# Patient Record
Sex: Female | Born: 1985
Health system: Southern US, Community
[De-identification: ages and names within clinical notes are randomized; demographics above are authoritative.]

## PROBLEM LIST (undated history)

## (undated) ENCOUNTER — Inpatient Hospital Stay (HOSPITAL_COMMUNITY): Payer: Self-pay

## (undated) DIAGNOSIS — O10919 Unspecified pre-existing hypertension complicating pregnancy, unspecified trimester: Secondary | ICD-10-CM

## (undated) DIAGNOSIS — F32A Depression, unspecified: Secondary | ICD-10-CM

## (undated) DIAGNOSIS — O139 Gestational [pregnancy-induced] hypertension without significant proteinuria, unspecified trimester: Secondary | ICD-10-CM

## (undated) DIAGNOSIS — K219 Gastro-esophageal reflux disease without esophagitis: Secondary | ICD-10-CM

## (undated) DIAGNOSIS — F329 Major depressive disorder, single episode, unspecified: Secondary | ICD-10-CM

## (undated) DIAGNOSIS — R87629 Unspecified abnormal cytological findings in specimens from vagina: Secondary | ICD-10-CM

## (undated) DIAGNOSIS — J45909 Unspecified asthma, uncomplicated: Secondary | ICD-10-CM

## (undated) DIAGNOSIS — F419 Anxiety disorder, unspecified: Secondary | ICD-10-CM

## (undated) DIAGNOSIS — I10 Essential (primary) hypertension: Secondary | ICD-10-CM

## (undated) DIAGNOSIS — B999 Unspecified infectious disease: Secondary | ICD-10-CM

## (undated) HISTORY — PX: LEEP: SHX91

## (undated) HISTORY — DX: Unspecified pre-existing hypertension complicating pregnancy, unspecified trimester: O10.919

## (undated) HISTORY — PX: LYMPH GLAND EXCISION: SHX13

---

## 2011-05-02 DIAGNOSIS — F909 Attention-deficit hyperactivity disorder, unspecified type: Secondary | ICD-10-CM | POA: Insufficient documentation

## 2011-05-02 DIAGNOSIS — F411 Generalized anxiety disorder: Secondary | ICD-10-CM | POA: Insufficient documentation

## 2011-05-02 DIAGNOSIS — I10 Essential (primary) hypertension: Secondary | ICD-10-CM | POA: Insufficient documentation

## 2013-02-02 DIAGNOSIS — K219 Gastro-esophageal reflux disease without esophagitis: Secondary | ICD-10-CM | POA: Insufficient documentation

## 2013-02-02 DIAGNOSIS — F53 Postpartum depression: Secondary | ICD-10-CM | POA: Insufficient documentation

## 2013-03-22 ENCOUNTER — Emergency Department: Payer: Self-pay | Admitting: Emergency Medicine

## 2013-03-22 LAB — COMPREHENSIVE METABOLIC PANEL
Anion Gap: 6 — ABNORMAL LOW (ref 7–16)
BUN: 11 mg/dL (ref 7–18)
Chloride: 105 mmol/L (ref 98–107)
Creatinine: 0.83 mg/dL (ref 0.60–1.30)
EGFR (African American): >60
Glucose: 99 mg/dL (ref 65–99)
Osmolality: 270 (ref 275–301)
SGPT (ALT): 21 U/L (ref 12–78)

## 2013-03-22 LAB — CBC
HCT: 45.1 % (ref 35.0–47.0)
HGB: 15.4 g/dL (ref 12.0–16.0)
MCH: 29.9 pg (ref 26.0–34.0)
MCHC: 34.2 g/dL (ref 32.0–36.0)
MCV: 88 fL (ref 80–100)
RDW: 12.8 % (ref 11.5–14.5)
WBC: 6.2 10*3/uL (ref 3.6–11.0)

## 2013-05-13 NOTE — L&D Delivery Note (Signed)
Patient was C/C/+4 and pushed for 1 minutes with epidural.   NSVD  female infant, Apgars 9,9, weight P.   The patient had no lacerations. Fundus was firm. EBL was expected amount. Placenta was delivered intact. Vagina was clear.  Baby was vigorous and doing skin to skin with mother.  Shauntell Iglesia A

## 2013-10-06 ENCOUNTER — Emergency Department (HOSPITAL_COMMUNITY)
Admission: EM | Admit: 2013-10-06 | Discharge: 2013-10-06 | Disposition: A | Discharge status: Home / Self Care | Payer: 59 | Attending: Emergency Medicine | Admitting: Emergency Medicine

## 2013-10-06 ENCOUNTER — Encounter (HOSPITAL_COMMUNITY): Payer: Self-pay | Admitting: Emergency Medicine

## 2013-10-06 DIAGNOSIS — R5381 Other malaise: Secondary | ICD-10-CM | POA: Insufficient documentation

## 2013-10-06 DIAGNOSIS — I10 Essential (primary) hypertension: Secondary | ICD-10-CM

## 2013-10-06 DIAGNOSIS — Z3490 Encounter for supervision of normal pregnancy, unspecified, unspecified trimester: Secondary | ICD-10-CM

## 2013-10-06 DIAGNOSIS — O9989 Other specified diseases and conditions complicating pregnancy, childbirth and the puerperium: Secondary | ICD-10-CM | POA: Insufficient documentation

## 2013-10-06 DIAGNOSIS — O99330 Smoking (tobacco) complicating pregnancy, unspecified trimester: Secondary | ICD-10-CM | POA: Insufficient documentation

## 2013-10-06 DIAGNOSIS — R509 Fever, unspecified: Secondary | ICD-10-CM | POA: Insufficient documentation

## 2013-10-06 DIAGNOSIS — O169 Unspecified maternal hypertension, unspecified trimester: Secondary | ICD-10-CM | POA: Insufficient documentation

## 2013-10-06 DIAGNOSIS — R5383 Other fatigue: Secondary | ICD-10-CM

## 2013-10-06 DIAGNOSIS — Z79899 Other long term (current) drug therapy: Secondary | ICD-10-CM | POA: Insufficient documentation

## 2013-10-06 HISTORY — DX: Essential (primary) hypertension: I10

## 2013-10-06 LAB — URINALYSIS, ROUTINE W REFLEX MICROSCOPIC
BILIRUBIN URINE: NEGATIVE
Glucose, UA: NEGATIVE mg/dL
Hgb urine dipstick: NEGATIVE
Ketones, ur: NEGATIVE mg/dL
LEUKOCYTES UA: NEGATIVE
Nitrite: NEGATIVE
PH: 6 (ref 5.0–8.0)
Protein, ur: NEGATIVE mg/dL
SPECIFIC GRAVITY, URINE: 1.026 (ref 1.005–1.030)
Urobilinogen, UA: 1 mg/dL (ref 0.0–1.0)

## 2013-10-06 LAB — BASIC METABOLIC PANEL
BUN: 11 mg/dL (ref 6–23)
CHLORIDE: 96 meq/L (ref 96–112)
CO2: 25 meq/L (ref 19–32)
Calcium: 9.8 mg/dL (ref 8.4–10.5)
Creatinine, Ser: 0.65 mg/dL (ref 0.50–1.10)
GFR calc Af Amer: >90 mL/min (ref >90)
GFR calc non Af Amer: >90 mL/min (ref >90)
Glucose, Bld: 91 mg/dL (ref 70–99)
Potassium: 3.6 mEq/L — ABNORMAL LOW (ref 3.7–5.3)
Sodium: 135 mEq/L — ABNORMAL LOW (ref 137–147)

## 2013-10-06 LAB — POC URINE PREG, ED: Preg Test, Ur: POSITIVE — AB

## 2013-10-06 MED ORDER — ACETAMINOPHEN 325 MG PO TABS
650.0000 mg | ORAL_TABLET | Freq: Once | ORAL | Status: AC
  Administered 2013-10-06: 650 mg via ORAL
  Filled 2013-10-06: qty 2

## 2013-10-06 NOTE — ED Provider Notes (Signed)
CSN: [id number]     Arrival date & time 10/06/13  1910 History   First MD Initiated Contact with Patient 10/06/13 2115     Chief Complaint  Patient presents with  . Hypertension     (Consider location/radiation/quality/duration/timing/severity/associated sxs/prior Treatment) HPI  This is a 28 yo G2P1 approx [redacted] weeks pregnant who presents with concerns for HTN.  Patient reports history of HTN as well as complications during her last pregnancy related to high blood pressure.  She is taking HCTZ and amlodipine.  Reports that felt weak with HA earlier and took her BP and noted to be 160/90.  COncerned given pregnancy.  Currently without complaint and triage BP 138/86.  Has not seen OB yet.  Denies abdominal pain, vaginal discharge, or bleeding.  Past Medical History  Diagnosis Date  . Hypertension    Past Surgical History  Procedure Laterality Date  . Lymph gland excision     History reviewed. No pertinent family history. History  Substance Use Topics  . Smoking status: Current Some Day Smoker  . Smokeless tobacco: Not on file  . Alcohol Use: No   OB History   Grav Para Term Preterm Abortions TAB SAB Ect Mult Living                 Review of Systems  Constitutional: Positive for fever.  Respiratory: Negative for shortness of breath.   Cardiovascular: Negative for chest pain.  Gastrointestinal: Negative for abdominal pain.  Genitourinary: Negative for dysuria, vaginal bleeding and vaginal discharge.  Skin: Negative for wound.  Neurological: Positive for headaches. Negative for numbness.  Psychiatric/Behavioral: Negative for confusion.  All other systems reviewed and are negative.     Allergies  Sulfate  Home Medications   Prior to Admission medications   Medication Sig Start Date End Date Taking? Authorizing Provider  albuterol (PROVENTIL HFA;VENTOLIN HFA) 108 (90 BASE) MCG/ACT inhaler Inhale 2 puffs into the lungs every 6 (six) hours as needed for wheezing or  shortness of breath.   Yes Historical Provider, MD  amLODipine (NORVASC) 5 MG tablet Take 5 mg by mouth daily.   Yes Historical Provider, MD  diphenhydrAMINE (BENADRYL) 25 mg capsule Take 25 mg by mouth every 8 (eight) hours as needed for itching.   Yes Historical Provider, MD  hydrochlorothiazide (HYDRODIURIL) 25 MG tablet Take 25 mg by mouth daily.   Yes Historical Provider, MD  Prenatal Vit-Fe Fumarate-FA (MULTIVITAMIN-PRENATAL) 27-0.8 MG TABS tablet Take 1 tablet by mouth daily at 12 noon.   Yes Historical Provider, MD   BP 116/69  Pulse 83  Temp(Src) 98.6 F (37 C) (Oral)  Resp 16  Ht 5\' 4"  (1.626 m)  Wt 187 lb (84.823 kg)  BMI 32.08 kg/m2  SpO2 100% Physical Exam  Nursing note and vitals reviewed. Constitutional: She is oriented to person, place, and time. She appears well-developed and well-nourished. No distress.  HENT:  Head: Normocephalic and atraumatic.  Mouth/Throat: Oropharynx is clear and moist.  Eyes: EOM are normal. Pupils are equal, round, and reactive to light.  Cardiovascular: Normal rate, regular rhythm and normal heart sounds.   No murmur heard. Pulmonary/Chest: Effort normal and breath sounds normal. No respiratory distress. She has no wheezes.  Abdominal: Soft. Bowel sounds are normal. There is no tenderness. There is no rebound.  Musculoskeletal: She exhibits no edema.  Neurological: She is alert and oriented to person, place, and time.  5/5 strength in all four extremities, CN II-XII intact, visual fields intact  Skin: Skin  is warm and dry.  Psychiatric: She has a normal mood and affect.    ED Course  Procedures (including critical care time) Labs Review Labs Reviewed  BASIC METABOLIC PANEL - Abnormal; Notable for the following:    Sodium 135 (*)    Potassium 3.6 (*)    All other components within normal limits  URINALYSIS, ROUTINE W REFLEX MICROSCOPIC - Abnormal; Notable for the following:    APPearance CLOUDY (*)    All other components within  normal limits  POC URINE PREG, ED - Abnormal; Notable for the following:    Preg Test, Ur POSITIVE (*)    All other components within normal limits    Imaging Review No results found.   EKG Interpretation None      MDM   Final diagnoses:  Pregnancy  Hypertension    Patient with 1st trimester pregnancy and HTN.  Currently assymptomatic.  Triage BP 138/86. Nonfocal on exam. Screening BMP and UA reassuring and no evidence of proteinuria.  Repeat BP 116/69.  Patient reassured.  Patient encouraged to continue BP meds and f/u with OB closely.  No current pregnancy related complaints.  After history, exam, and medical workup I feel the patient has been appropriately medically screened and is safe for discharge home. Pertinent diagnoses were discussed with the patient. Patient was given return precautions.     Shon Batonourtney F Horton, MD 10/08/13 2107

## 2013-10-06 NOTE — Discharge Instructions (Signed)
Arterial Hypertension °Arterial hypertension (high blood pressure) is a condition of elevated pressure in your blood vessels. Hypertension over a long period of time is a risk factor for strokes, heart attacks, and heart failure. It is also the leading cause of kidney (renal) failure.  °CAUSES  °· In Adults -- Over 90% of all hypertension has no known cause. This is called essential or primary hypertension. In the other 10% of people with hypertension, the increase in blood pressure is caused by another disorder. This is called secondary hypertension. Important causes of secondary hypertension are: °· Heavy alcohol use. °· Obstructive sleep apnea. °· Hyperaldosterosim (Conn's syndrome). °· Steroid use. °· Chronic kidney failure. °· Hyperparathyroidism. °· Medications. °· Renal artery stenosis. °· Pheochromocytoma. °· Cushing's disease. °· Coarctation of the aorta. °· Scleroderma renal crisis. °· Licorice (in excessive amounts). °· Drugs (cocaine, methamphetamine). °Your caregiver can explain any items above that apply to you. °· In Children -- Secondary hypertension is more common and should always be considered. °· Pregnancy -- Few women of childbearing age have high blood pressure. However, up to 10% of them develop hypertension of pregnancy. Generally, this will not harm the woman. It may be a sign of 3 complications of pregnancy: preeclampsia, HELLP syndrome, and eclampsia. Follow up and control with medication is necessary. °SYMPTOMS  °· This condition normally does not produce any noticeable symptoms. It is usually found during a routine exam. °· Malignant hypertension is a late problem of high blood pressure. It may have the following symptoms: °· Headaches. °· Blurred vision. °· End-organ damage (this means your kidneys, heart, lungs, and other organs are being damaged). °· Stressful situations can increase the blood pressure. If a person with normal blood pressure has their blood pressure go up while being  seen by their caregiver, this is often termed "white coat hypertension." Its importance is not known. It may be related with eventually developing hypertension or complications of hypertension. °· Hypertension is often confused with mental tension, stress, and anxiety. °DIAGNOSIS  °The diagnosis is made by 3 separate blood pressure measurements. They are taken at least 1 week apart from each other. If there is organ damage from hypertension, the diagnosis may be made without repeat measurements. °Hypertension is usually identified by having blood pressure readings: °· Above 140/90 mmHg measured in both arms, at 3 separate times, over a couple weeks. °· Over 130/80 mmHg should be considered a risk factor and may require treatment in patients with diabetes. °Blood pressure readings over 120/80 mmHg are called "pre-hypertension" even in non-diabetic patients. °To get a true blood pressure measurement, use the following guidelines. Be aware of the factors that can alter blood pressure readings. °· Take measurements at least 1 hour after caffeine. °· Take measurements 30 minutes after smoking and without any stress. This is another reason to quit smoking  it raises your blood pressure. °· Use a proper cuff size. Ask your caregiver if you are not sure about your cuff size. °· Most home blood pressure cuffs are automatic. They will measure systolic and diastolic pressures. The systolic pressure is the pressure reading at the start of sounds. Diastolic pressure is the pressure at which the sounds disappear. If you are elderly, measure pressures in multiple postures. Try sitting, lying or standing. °· Sit at rest for a minimum of 5 minutes before taking measurements. °· You should not be on any medications like decongestants. These are found in many cold medications. °· Record your blood pressure readings and review   them with your caregiver. °If you have hypertension: °· Your caregiver may do tests to be sure you do not have  secondary hypertension (see "causes" above). °· Your caregiver may also look for signs of metabolic syndrome. This is also called Syndrome X or Insulin Resistance Syndrome. You may have this syndrome if you have type 2 diabetes, abdominal obesity, and abnormal blood lipids in addition to hypertension. °· Your caregiver will take your medical and family history and perform a physical exam. °· Diagnostic tests may include blood tests (for glucose, cholesterol, potassium, and kidney function), a urinalysis, or an EKG. Other tests may also be necessary depending on your condition. °PREVENTION  °There are important lifestyle issues that you can adopt to reduce your chance of developing hypertension: °· Maintain a normal weight. °· Limit the amount of salt (sodium) in your diet. °· Exercise often. °· Limit alcohol intake. °· Get enough potassium in your diet. Discuss specific advice with your caregiver. °· Follow a DASH diet (dietary approaches to stop hypertension). This diet is rich in fruits, vegetables, and low-fat dairy products, and avoids certain fats. °PROGNOSIS  °Essential hypertension cannot be cured. Lifestyle changes and medical treatment can lower blood pressure and reduce complications. The prognosis of secondary hypertension depends on the underlying cause. Many people whose hypertension is controlled with medicine or lifestyle changes can live a normal, healthy life.  °RISKS AND COMPLICATIONS  °While high blood pressure alone is not an illness, it often requires treatment due to its short- and long-term effects on many organs. Hypertension increases your risk for: °· CVAs or strokes (cerebrovascular accident). °· Heart failure due to chronically high blood pressure (hypertensive cardiomyopathy). °· Heart attack (myocardial infarction). °· Damage to the retina (hypertensive retinopathy). °· Kidney failure (hypertensive nephropathy). °Your caregiver can explain list items above that apply to you. Treatment  of hypertension can significantly reduce the risk of complications. °TREATMENT  °· For overweight patients, weight loss and regular exercise are recommended. Physical fitness lowers blood pressure. °· Mild hypertension is usually treated with diet and exercise. A diet rich in fruits and vegetables, fat-free dairy products, and foods low in fat and salt (sodium) can help lower blood pressure. Decreasing salt intake decreases blood pressure in a 1/3 of people. °· Stop smoking if you are a smoker. °The steps above are highly effective in reducing blood pressure. While these actions are easy to suggest, they are difficult to achieve. Most patients with moderate or severe hypertension end up requiring medications to bring their blood pressure down to a normal level. There are several classes of medications for treatment. Blood pressure pills (antihypertensives) will lower blood pressure by their different actions. Lowering the blood pressure by 10 mmHg may decrease the risk of complications by as much as 25%. °The goal of treatment is effective blood pressure control. This will reduce your risk for complications. Your caregiver will help you determine the best treatment for you according to your lifestyle. What is excellent treatment for one person, may not be for you. °HOME CARE INSTRUCTIONS  °· Do not smoke. °· Follow the lifestyle changes outlined in the "Prevention" section. °· If you are on medications, follow the directions carefully. Blood pressure medications must be taken as prescribed. Skipping doses reduces their benefit. It also puts you at risk for problems. °· Follow up with your caregiver, as directed. °· If you are asked to monitor your blood pressure at home, follow the guidelines in the "Diagnosis" section above. °SEEK MEDICAL CARE   IF:   You think you are having medication side effects.  You have recurrent headaches or lightheadedness.  You have swelling in your ankles.  You have trouble with  your vision. SEEK IMMEDIATE MEDICAL CARE IF:   You have sudden onset of chest pain or pressure, difficulty breathing, or other symptoms of a heart attack.  You have a severe headache.  You have symptoms of a stroke (such as sudden weakness, difficulty speaking, difficulty walking). MAKE SURE YOU:   Understand these instructions.  Will watch your condition.  Will get help right away if you are not doing well or get worse. Document Released: 04/29/2005 Document Revised: 07/22/2011 Document Reviewed: 11/27/2006 [facility] Patient Information 2014 [place], [place]. Pregnancy  During your pregnancy, it is important to follow certain guidelines in order to have a healthy baby. It is very important to get good prenatal care and follow your caregiver's instructions. Prenatal care includes all the medical care you receive before your baby's birth. This helps to prevent problems during the pregnancy and childbirth. HOME CARE INSTRUCTIONS   Start your prenatal visits by the 12th week of pregnancy or earlier, if possible. At first, appointments are usually scheduled monthly. They become more frequent in the last 2 months before delivery. It is important that you keep your caregiver's appointments and follow your caregiver's instructions regarding medication use, exercise, and diet.  During pregnancy, you are providing food for you and your baby. Eat a regular, well-balanced diet. Choose foods such as meat, fish, milk and other dairy products, vegetables, fruits, whole-grain breads and cereals. Your caregiver will inform you of the ideal weight gain depending on your current height and weight. Drink lots of liquids. Try to drink 8 glasses of water a day.  Alcohol is associated with a number of birth defects including fetal alcohol syndrome. It is best to avoid alcohol completely. Smoking will cause low birth rate and prematurity. Use of alcohol and nicotine during your pregnancy also increases the chances  that your child will be chemically dependent later in their life and may contribute to SIDS (Sudden Infant Death Syndrome).  Do not use illegal drugs.  Only take prescription or over-the-counter medications that are recommended by your caregiver. Other medications can cause genetic and physical problems in the baby.  Morning sickness can often be helped by keeping soda crackers at the bedside. Eat a few before getting up in the morning.  A sexual relationship may be continued until near the end of pregnancy if there are no other problems such as early (premature) leaking of amniotic fluid from the membranes, vaginal bleeding, painful intercourse or belly (abdominal) pain.  Exercise regularly. Check with your caregiver if you are unsure of the safety of some of your exercises.  Do not use hot tubs, steam rooms or saunas. These increase the risk of fainting and hurting yourself and the baby. Swimming is OK for exercise. Get plenty of rest, including afternoon naps when possible, especially in the third trimester.  Avoid toxic odors and chemicals.  Do not wear high heels. They may cause you to lose your balance and fall.  Do not lift over 5 pounds. If you do lift anything, lift with your legs and thighs, not your back.  Avoid long trips, especially in the third trimester.  If you have to travel out of the city or state, take a copy of your medical records with you. SEEK IMMEDIATE MEDICAL CARE IF:   You develop an unexplained oral temperature above 102  F (38.9 C), or as your caregiver suggests.  You have leaking of fluid from the vagina. If leaking membranes are suspected, take your temperature and inform your caregiver of this when you call.  There is vaginal spotting or bleeding. Notify your caregiver of the amount and how many pads are used.  You continue to feel sick to your stomach (nauseous) and have no relief from remedies suggested, or you throw up (vomit) blood or coffee ground  like materials.  You develop upper abdominal pain.  You have round ligament discomfort in the lower abdominal area. This still must be evaluated by your caregiver.  You feel contractions of the uterus.  You do not feel the baby move, or there is less movement than before.  You have painful urination.  You have abnormal vaginal discharge.  You have persistent diarrhea.  You get a severe headache.  You have problems with your vision.  You develop muscle weakness.  You feel dizzy and faint.  You develop shortness of breath.  You develop chest pain.  You have back pain that travels down to your leg and feet.  You feel irregular or a very fast heartbeat.  You develop excessive weight gain in a short period of time (5 pounds in 3 to 5 days).  You are involved in a domestic violence situation. Document Released: 04/29/2005 Document Revised: 10/29/2011 Document Reviewed: 10/21/2008 [facility] Patient Information 2014 [place], [place].

## 2013-10-06 NOTE — ED Notes (Signed)
Pt states that she took her BP this afternoon and that it was elevated at 160/90 and having a headache, feeling weak and experienced some vision changes. Pt reports a history of blood pressure since 14. Pt currently on BP meds. BP normal on arrival. Pt alert and ambulatory. No initial prenatal visit yet but denies any problems since pregnancy started.

## 2013-11-22 ENCOUNTER — Encounter (HOSPITAL_COMMUNITY): Payer: Self-pay | Admitting: *Deleted

## 2013-11-22 ENCOUNTER — Inpatient Hospital Stay (HOSPITAL_COMMUNITY)
Admission: AD | Admit: 2013-11-22 | Discharge: 2013-11-22 | Disposition: A | Discharge status: Home / Self Care | Payer: 59 | Source: Ambulatory Visit | Attending: Obstetrics and Gynecology | Admitting: Obstetrics and Gynecology

## 2013-11-22 DIAGNOSIS — O99340 Other mental disorders complicating pregnancy, unspecified trimester: Secondary | ICD-10-CM | POA: Insufficient documentation

## 2013-11-22 DIAGNOSIS — O10912 Unspecified pre-existing hypertension complicating pregnancy, second trimester: Secondary | ICD-10-CM

## 2013-11-22 DIAGNOSIS — O10019 Pre-existing essential hypertension complicating pregnancy, unspecified trimester: Secondary | ICD-10-CM

## 2013-11-22 DIAGNOSIS — F341 Dysthymic disorder: Secondary | ICD-10-CM | POA: Insufficient documentation

## 2013-11-22 DIAGNOSIS — Z87891 Personal history of nicotine dependence: Secondary | ICD-10-CM | POA: Insufficient documentation

## 2013-11-22 HISTORY — DX: Gestational (pregnancy-induced) hypertension without significant proteinuria, unspecified trimester: O13.9

## 2013-11-22 HISTORY — DX: Unspecified infectious disease: B99.9

## 2013-11-22 HISTORY — DX: Unspecified abnormal cytological findings in specimens from vagina: R87.629

## 2013-11-22 HISTORY — DX: Depression, unspecified: F32.A

## 2013-11-22 HISTORY — DX: Anxiety disorder, unspecified: F41.9

## 2013-11-22 HISTORY — DX: Major depressive disorder, single episode, unspecified: F32.9

## 2013-11-22 LAB — CBC
HEMATOCRIT: 39.5 % (ref 36.0–46.0)
HEMOGLOBIN: 13.7 g/dL (ref 12.0–15.0)
MCH: 30 pg (ref 26.0–34.0)
MCHC: 34.7 g/dL (ref 30.0–36.0)
MCV: 86.4 fL (ref 78.0–100.0)
Platelets: 270 10*3/uL (ref 150–400)
RBC: 4.57 MIL/uL (ref 3.87–5.11)
RDW: 13.2 % (ref 11.5–15.5)
WBC: 11.2 10*3/uL — ABNORMAL HIGH (ref 4.0–10.5)

## 2013-11-22 LAB — PROTEIN / CREATININE RATIO, URINE
CREATININE, URINE: 127.9 mg/dL
Protein Creatinine Ratio: 0.05 (ref 0.00–0.15)
TOTAL PROTEIN, URINE: 7 mg/dL

## 2013-11-22 LAB — COMPREHENSIVE METABOLIC PANEL
ALK PHOS: 55 U/L (ref 39–117)
ALT: 20 U/L (ref 0–35)
ANION GAP: 12 (ref 5–15)
AST: 18 U/L (ref 0–37)
Albumin: 3.3 g/dL — ABNORMAL LOW (ref 3.5–5.2)
BUN: 5 mg/dL — AB (ref 6–23)
CALCIUM: 9.8 mg/dL (ref 8.4–10.5)
CO2: 23 mEq/L (ref 19–32)
Chloride: 101 mEq/L (ref 96–112)
Creatinine, Ser: 0.57 mg/dL (ref 0.50–1.10)
GFR calc non Af Amer: >90 mL/min (ref >90)
GLUCOSE: 74 mg/dL (ref 70–99)
Potassium: 4.3 mEq/L (ref 3.7–5.3)
Sodium: 136 mEq/L — ABNORMAL LOW (ref 137–147)
TOTAL PROTEIN: 6.8 g/dL (ref 6.0–8.3)
Total Bilirubin: <0.2 mg/dL — ABNORMAL LOW (ref 0.3–1.2)

## 2013-11-22 LAB — URINALYSIS, ROUTINE W REFLEX MICROSCOPIC
Bilirubin Urine: NEGATIVE
GLUCOSE, UA: NEGATIVE mg/dL
Hgb urine dipstick: NEGATIVE
Ketones, ur: NEGATIVE mg/dL
LEUKOCYTES UA: NEGATIVE
NITRITE: NEGATIVE
PROTEIN: NEGATIVE mg/dL
Specific Gravity, Urine: 1.02 (ref 1.005–1.030)
Urobilinogen, UA: 0.2 mg/dL (ref 0.0–1.0)
pH: 6 (ref 5.0–8.0)

## 2013-11-22 MED ORDER — LABETALOL HCL 200 MG PO TABS: 200.0000 mg | ORAL_TABLET | Freq: Two times a day (BID) | ORAL | Status: DC

## 2013-11-22 MED ORDER — LABETALOL HCL 100 MG PO TABS
200.0000 mg | ORAL_TABLET | ORAL | Status: AC
  Administered 2013-11-22: 200 mg via ORAL
  Filled 2013-11-22: qty 2

## 2013-11-22 MED ORDER — LABETALOL HCL 200 MG PO TABS: 200.0000 mg | ORAL_TABLET | ORAL | Status: DC

## 2013-11-22 NOTE — Discharge Instructions (Signed)

## 2013-11-22 NOTE — [facility] Note (Addendum)
Hx of chronic hypertension, is on medication.  BP high all weekend, feeling dizzy at times, called MD instructed to come in and get checked. 156/102 this weekend. 150/98 this morning.

## 2013-11-22 NOTE — [facility] Provider Note (Signed)
Discussed with Dr. Mora ApplPinn prior to pt arrival in [facility].  Alerted that patient was just seen in office and Rx given as above.  Pt not yet started.  Agree with note above.

## 2013-11-22 NOTE — MAU Provider Note (Signed)
Chief Complaint: Hypertension   None    SUBJECTIVE HPI: Kelli Wells is a 28 y.o. G2P1001 at [redacted]w[redacted]d by LMP who presents to maternity admissions reporting elevated blood pressures x3 days with intermittent h/a unresolved by Tylenol. Her blood pressures were 150s/90s at home yesterday and today.  She reports she has chronic HTN and was taking Aldomet but her doctor recently changed her to labetalol.  She has not yet started the labetalol and is worried about cost. She may have to wait until this Friday which is payday so she can pick up her new Rx.  She denies vaginal bleeding, vaginal itching/burning, urinary symptoms, dizziness, n/v, or fever/chills.    Past Medical History  Diagnosis Date  . Pregnancy induced hypertension   . Vaginal Pap smear, abnormal     LEEP, normal since  . Infection     UTI  . Anxiety   . Depression    Past Surgical History  Procedure Laterality Date  . Leep     History   Social History  . Marital Status: Unknown    Spouse Name: N/A    Number of Children: N/A  . Years of Education: N/A   Occupational History  . Not on file.   Social History Main Topics  . Smoking status: Former Games developermoker  . Smokeless tobacco: Never Used     Comment: quit June 2015  . Alcohol Use: No  . Drug Use: No  . Sexual Activity: Not on file   Other Topics Concern  . Not on file   Social History Narrative  . No narrative on file   No current facility-administered medications on file prior to encounter.   No current outpatient prescriptions on file prior to encounter.   Allergies  Allergen Reactions  . Sulfa Antibiotics Swelling    ROS: Pertinent items in HPI  OBJECTIVE Blood pressure 138/80, pulse 78, temperature 98.3 F (36.8 C), temperature source Oral, resp. rate 18, height 5' 2.25" (1.581 m), weight 88.451 kg (195 lb).  Patient Vitals for the past 24 hrs:  BP Temp Temp src Pulse Resp Height Weight  11/22/13 1349 138/80 mmHg - - 78 - - -  11/22/13 1105  145/94 mmHg - - 95 - - -  11/22/13 1058 146/90 mmHg 98.3 F (36.8 C) Oral 100 18 5' 2.25" (1.581 m) 88.451 kg (195 lb)   GENERAL: Well-developed, well-nourished female in no acute distress.  HEENT: Normocephalic HEART: normal rate RESP: normal effort ABDOMEN: Soft, non-tender EXTREMITIES: Nontender, no edema NEURO: Alert and oriented  FHT 146 by doppler  LAB RESULTS Results for orders placed during the hospital encounter of 11/22/13 (from the past 24 hour(s))  URINALYSIS, ROUTINE W REFLEX MICROSCOPIC     Status: None   Collection Time    11/22/13 10:38 AM      Result Value Ref Range   Color, Urine YELLOW  YELLOW   APPearance CLEAR  CLEAR   Specific Gravity, Urine 1.020  1.005 - 1.030   pH 6.0  5.0 - 8.0   Glucose, UA NEGATIVE  NEGATIVE mg/dL   Hgb urine dipstick NEGATIVE  NEGATIVE   Bilirubin Urine NEGATIVE  NEGATIVE   Ketones, ur NEGATIVE  NEGATIVE mg/dL   Protein, ur NEGATIVE  NEGATIVE mg/dL   Urobilinogen, UA 0.2  0.0 - 1.0 mg/dL   Nitrite NEGATIVE  NEGATIVE   Leukocytes, UA NEGATIVE  NEGATIVE  PROTEIN / CREATININE RATIO, URINE     Status: None   Collection Time  11/22/13 10:38 AM      Result Value Ref Range   Creatinine, Urine 127.90     Total Protein, Urine 7     PROTEIN CREATININE RATIO 0.05  0.00 - 0.15  CBC     Status: Abnormal   Collection Time    11/22/13 12:15 PM      Result Value Ref Range   WBC 11.2 (*) 4.0 - 10.5 K/uL   RBC 4.57  3.87 - 5.11 MIL/uL   Hemoglobin 13.7  12.0 - 15.0 g/dL   HCT 09.8  11.9 - 14.7 %   MCV 86.4  78.0 - 100.0 fL   MCH 30.0  26.0 - 34.0 pg   MCHC 34.7  30.0 - 36.0 g/dL   RDW 82.9  56.2 - 13.0 %   Platelets 270  150 - 400 K/uL  COMPREHENSIVE METABOLIC PANEL     Status: Abnormal   Collection Time    11/22/13 12:15 PM      Result Value Ref Range   Sodium 136 (*) 137 - 147 mEq/L   Potassium 4.3  3.7 - 5.3 mEq/L   Chloride 101  96 - 112 mEq/L   CO2 23  19 - 32 mEq/L   Glucose, Bld 74  70 - 99 mg/dL   BUN 5 (*) 6 - 23  mg/dL   Creatinine, Ser 8.65  0.50 - 1.10 mg/dL   Calcium 9.8  8.4 - 78.4 mg/dL   Total Protein 6.8  6.0 - 8.3 g/dL   Albumin 3.3 (*) 3.5 - 5.2 g/dL   AST 18  0 - 37 U/L   ALT 20  0 - 35 U/L   Alkaline Phosphatase 55  39 - 117 U/L   Total Bilirubin <0.2 (*) 0.3 - 1.2 mg/dL   GFR calc non Af Amer >90  >90 mL/min   GFR calc Af Amer >90  >90 mL/min   Anion gap 12  5 - 15    ASSESSMENT 1. Chronic hypertension in pregnancy, second trimester     PLAN Consult Dr Claiborne Billings Labetalol 200 mg PO x1 dose in MAU Discharge home Pick up labetalol Rx as soon as possible.  Discontinue Aldomet and start labetalol 200 mg BID.  Pt given 24 hour urine supplies and instructions.  Drop off urine in office this week.   F/U in office as scheduled Return to MAU as needed for emergencies    Medication List    STOP taking these medications       methyldopa 250 MG tablet  Commonly known as:  ALDOMET      TAKE these medications       acetaminophen 500 MG tablet  Commonly known as:  TYLENOL  Take 1,000 mg by mouth every 6 (six) hours as needed for fever.     labetalol 200 MG tablet  Commonly known as:  NORMODYNE  Take 1 tablet (200 mg total) by mouth stat.     prenatal multivitamin Tabs tablet  Take 1 tablet by mouth daily at 12 noon.       Follow-up Information   Follow up with CALLAHAN, SIDNEY, DO In 1 day. (Drop off 24 hour urine tomorrow.  Start labetalol prescription as soon as possible.  Keep scheduled appointments in the office. )    Specialty:  Obstetrics and Gynecology   Contact information:   [address] [place] [address] [phone]       Sharen Counter Certified Nurse-Midwife 11/22/2013  1:54 PM

## 2014-02-17 ENCOUNTER — Encounter (HOSPITAL_COMMUNITY): Payer: Self-pay

## 2014-02-17 ENCOUNTER — Inpatient Hospital Stay (HOSPITAL_COMMUNITY)
Admission: AD | Admit: 2014-02-17 | Discharge: 2014-02-17 | Disposition: A | Discharge status: Home / Self Care | Payer: 59 | Source: Ambulatory Visit | Attending: Obstetrics and Gynecology | Admitting: Obstetrics and Gynecology

## 2014-02-17 DIAGNOSIS — O162 Unspecified maternal hypertension, second trimester: Secondary | ICD-10-CM

## 2014-02-17 DIAGNOSIS — O99332 Smoking (tobacco) complicating pregnancy, second trimester: Secondary | ICD-10-CM | POA: Diagnosis not present

## 2014-02-17 DIAGNOSIS — Z3A25 25 weeks gestation of pregnancy: Secondary | ICD-10-CM | POA: Diagnosis not present

## 2014-02-17 DIAGNOSIS — O10012 Pre-existing essential hypertension complicating pregnancy, second trimester: Secondary | ICD-10-CM | POA: Insufficient documentation

## 2014-02-17 LAB — CBC
HCT: 37.9 % (ref 36.0–46.0)
Hemoglobin: 13.1 g/dL (ref 12.0–15.0)
MCH: 29.3 pg (ref 26.0–34.0)
MCHC: 34.6 g/dL (ref 30.0–36.0)
MCV: 84.8 fL (ref 78.0–100.0)
Platelets: 237 10*3/uL (ref 150–400)
RBC: 4.47 MIL/uL (ref 3.87–5.11)
RDW: 13.6 % (ref 11.5–15.5)
WBC: 12.8 10*3/uL — AB (ref 4.0–10.5)

## 2014-02-17 LAB — COMPREHENSIVE METABOLIC PANEL
ALBUMIN: 3.3 g/dL — AB (ref 3.5–5.2)
ALT: 17 U/L (ref 0–35)
AST: 13 U/L (ref 0–37)
Alkaline Phosphatase: 65 U/L (ref 39–117)
Anion gap: 11 (ref 5–15)
BUN: 7 mg/dL (ref 6–23)
CO2: 20 mEq/L (ref 19–32)
Calcium: 9.2 mg/dL (ref 8.4–10.5)
Chloride: 104 mEq/L (ref 96–112)
Creatinine, Ser: 0.55 mg/dL (ref 0.50–1.10)
GFR calc Af Amer: >90 mL/min (ref >90)
GFR calc non Af Amer: >90 mL/min (ref >90)
Glucose, Bld: 75 mg/dL (ref 70–99)
POTASSIUM: 4 meq/L (ref 3.7–5.3)
SODIUM: 135 meq/L — AB (ref 137–147)
Total Bilirubin: <0.2 mg/dL — ABNORMAL LOW (ref 0.3–1.2)
Total Protein: 6.5 g/dL (ref 6.0–8.3)

## 2014-02-17 LAB — PROTEIN / CREATININE RATIO, URINE
Creatinine, Urine: 178.08 mg/dL
PROTEIN CREATININE RATIO: 0.06 (ref 0.00–0.15)
Total Protein, Urine: 10.3 mg/dL

## 2014-02-17 LAB — URINALYSIS, ROUTINE W REFLEX MICROSCOPIC
Bilirubin Urine: NEGATIVE
GLUCOSE, UA: NEGATIVE mg/dL
Ketones, ur: NEGATIVE mg/dL
LEUKOCYTES UA: NEGATIVE
Nitrite: NEGATIVE
PROTEIN: NEGATIVE mg/dL
Specific Gravity, Urine: 1.025 (ref 1.005–1.030)
UROBILINOGEN UA: 0.2 mg/dL (ref 0.0–1.0)
pH: 6 (ref 5.0–8.0)

## 2014-02-17 LAB — URINE MICROSCOPIC-ADD ON

## 2014-02-17 LAB — URIC ACID: URIC ACID, SERUM: 3.9 mg/dL (ref 2.4–7.0)

## 2014-02-17 MED ORDER — ACETAMINOPHEN 500 MG PO TABS
1000.0000 mg | ORAL_TABLET | Freq: Once | ORAL | Status: AC
  Administered 2014-02-17: 1000 mg via ORAL
  Filled 2014-02-17: qty 2

## 2014-02-17 NOTE — MAU Note (Signed)
Patient states she has a history of HTN. States she has been under a lot of stress at work and took her BP and was 159/104 and this am was 135/82.  150/94 just prior to coming to MAU. States she has been having headaches off and on for a couple of weeks, blurred vision today. Mild tightening in the abdomen, good fetal movement. Denies bleeding but has leaking that she thinks is her bladder.

## 2014-02-17 NOTE — MAU Provider Note (Signed)
History     CSN: [id number]  Arrival date and time: 02/17/14 1244   None     Chief Complaint  Patient presents with  . Hypertension   HPIpt is G2P1 at 5937w6d with hx of pre-eclampsia with previous pregnancy 9 years ago delivered at 37 weeks.  Pt has been on  labetolol with control until recently.  Pt has BP of BP 159/104 and then this morning was 135/82 at work this morning and 159/94 before came to MAU.  Pt has been having headaches frontal- with blurred vision. Pt has been taking Tylenol - last taken 3 days ago with relief. Pt has not taken any today. Pt wears panty liner for leakage of urine.   RN note: Garvin Filaonna C Coley, RN Registered Nurse Signed Nursing MAU Note Service date: 02/17/2014 1:02 PM   Patient states she has a history of HTN. States she has been under a lot of stress at work and took her BP and was 159/104 and this am was 135/82. 150/94 just prior to coming to MAU. States she has been having headaches off and on for a couple of weeks, blurred vision today. Mild tightening in the abdomen, good fetal movement. Denies bleeding but has leaking that she thinks is her bladder.      Past Medical History  Diagnosis Date  . Hypertension     Past Surgical History  Procedure Laterality Date  . Lymph gland excision      No family history on file.  History  Substance Use Topics  . Smoking status: Current Some Day Smoker  . Smokeless tobacco: Not on file  . Alcohol Use: No    Allergies:  Allergies  Allergen Reactions  . Sulfate Swelling    Prescriptions prior to admission  Medication Sig Dispense Refill  . albuterol (PROVENTIL HFA;VENTOLIN HFA) 108 (90 BASE) MCG/ACT inhaler Inhale 2 puffs into the lungs every 6 (six) hours as needed for wheezing or shortness of breath.      Marland Kitchen. amLODipine (NORVASC) 5 MG tablet Take 5 mg by mouth daily.      . diphenhydrAMINE (BENADRYL) 25 mg capsule Take 25 mg by mouth every 8 (eight) hours as needed for itching.      .  hydrochlorothiazide (HYDRODIURIL) 25 MG tablet Take 25 mg by mouth daily.      . Prenatal Vit-Fe Fumarate-FA (MULTIVITAMIN-PRENATAL) 27-0.8 MG TABS tablet Take 1 tablet by mouth daily at 12 noon.        Review of Systems  Constitutional: Negative for fever.  Gastrointestinal: Positive for abdominal pain. Negative for nausea, vomiting, diarrhea and constipation.  Genitourinary: Negative for dysuria and urgency.  Neurological: Positive for headaches.   Physical Exam   Blood pressure 151/76, pulse 97, temperature 98.9 F (37.2 C), temperature source Oral, resp. rate 16, height 5\' 3"  (1.6 m), weight 197 lb 12.8 oz (89.721 kg), SpO2 99.00%.  Physical Exam  Vitals reviewed. Constitutional: She is oriented to person, place, and time. She appears well-developed and well-nourished. No distress.  HENT:  Head: Normocephalic.  Eyes: Pupils are equal, round, and reactive to light.  Neck: Normal range of motion. Neck supple.  Cardiovascular: Normal rate.   Respiratory: Effort normal.  GI: Soft. She exhibits no distension. There is no tenderness. There is no rebound.  No ctx noted  Musculoskeletal: Normal range of motion.  Neurological: She is alert and oriented to person, place, and time.  Skin: Skin is warm and dry.  Psychiatric: She has a normal mood  and affect.    MAU Course  Procedures Results for orders placed during the hospital encounter of 02/17/14 (from the past 24 hour(s))  URINALYSIS, ROUTINE W REFLEX MICROSCOPIC     Status: Abnormal   Collection Time    02/17/14 12:50 PM      Result Value Ref Range   Color, Urine YELLOW  YELLOW   APPearance CLEAR  CLEAR   Specific Gravity, Urine 1.025  1.005 - 1.030   pH 6.0  5.0 - 8.0   Glucose, UA NEGATIVE  NEGATIVE mg/dL   Hgb urine dipstick TRACE (*) NEGATIVE   Bilirubin Urine NEGATIVE  NEGATIVE   Ketones, ur NEGATIVE  NEGATIVE mg/dL   Protein, ur NEGATIVE  NEGATIVE mg/dL   Urobilinogen, UA 0.2  0.0 - 1.0 mg/dL   Nitrite NEGATIVE   NEGATIVE   Leukocytes, UA NEGATIVE  NEGATIVE  URINE MICROSCOPIC-ADD ON     Status: Abnormal   Collection Time    02/17/14 12:50 PM      Result Value Ref Range   Squamous Epithelial / LPF FEW (*) RARE   WBC, UA 0-2  <3 WBC/hpf   RBC / HPF 3-6  <3 RBC/hpf   Bacteria, UA FEW (*) RARE  CBC     Status: Abnormal   Collection Time    02/17/14  1:40 PM      Result Value Ref Range   WBC 12.8 (*) 4.0 - 10.5 K/uL   RBC 4.47  3.87 - 5.11 MIL/uL   Hemoglobin 13.1  12.0 - 15.0 g/dL   HCT 16.1  09.6 - 04.5 %   MCV 84.8  78.0 - 100.0 fL   MCH 29.3  26.0 - 34.0 pg   MCHC 34.6  30.0 - 36.0 g/dL   RDW 40.9  81.1 - 91.4 %   Platelets 237  150 - 400 K/uL  COMPREHENSIVE METABOLIC PANEL     Status: Abnormal   Collection Time    02/17/14  1:40 PM      Result Value Ref Range   Sodium 135 (*) 137 - 147 mEq/L   Potassium 4.0  3.7 - 5.3 mEq/L   Chloride 104  96 - 112 mEq/L   CO2 20  19 - 32 mEq/L   Glucose, Bld 75  70 - 99 mg/dL   BUN 7  6 - 23 mg/dL   Creatinine, Ser 7.82  0.50 - 1.10 mg/dL   Calcium 9.2  8.4 - 95.6 mg/dL   Total Protein 6.5  6.0 - 8.3 g/dL   Albumin 3.3 (*) 3.5 - 5.2 g/dL   AST 13  0 - 37 U/L   ALT 17  0 - 35 U/L   Alkaline Phosphatase 65  39 - 117 U/L   Total Bilirubin <0.2 (*) 0.3 - 1.2 mg/dL   GFR calc non Af Amer >90  >90 mL/min   GFR calc Af Amer >90  >90 mL/min   Anion gap 11  5 - 15  URIC ACID     Status: None   Collection Time    02/17/14  1:40 PM      Result Value Ref Range   Uric Acid, Serum 3.9  2.4 - 7.0 mg/dL  BP range 213-086/57-84 Tylenol 1000mg  given for h/a with some diminishment of pain Pt fed which helped headache as well No ctx noted FHR baseline 150 bpm with 10x10 accelerations LM for Dr. Henderson Cloud to call- Dr. Henderson Cloud to call back Will go ahead and send pt home  to f/u in office tomorrow for BP check Discussed with Dr. Henderson Cloud- pt may f/u in office next week unless BP rises to 100 diastolic again or sx Attempted to call pt- wrong mobile# in chart; LM  on home phone to CB(non personalized) Assessment and Plan  Hypertension in pregnancy- second trimester F/u in office tomorrow for repeat BP- may wait until next week unless elevated again   [facility] 02/17/2014, 1:23 PM

## 2014-02-27 ENCOUNTER — Encounter (HOSPITAL_COMMUNITY): Payer: Self-pay | Admitting: Advanced Practice Midwife

## 2014-02-27 ENCOUNTER — Inpatient Hospital Stay (HOSPITAL_COMMUNITY)
Admission: AD | Admit: 2014-02-27 | Discharge: 2014-02-27 | Disposition: A | Discharge status: Home / Self Care | Payer: 59 | Source: Ambulatory Visit | Attending: Obstetrics and Gynecology | Admitting: Obstetrics and Gynecology

## 2014-02-27 DIAGNOSIS — N760 Acute vaginitis: Secondary | ICD-10-CM | POA: Insufficient documentation

## 2014-02-27 DIAGNOSIS — O23592 Infection of other part of genital tract in pregnancy, second trimester: Secondary | ICD-10-CM | POA: Insufficient documentation

## 2014-02-27 DIAGNOSIS — Z87891 Personal history of nicotine dependence: Secondary | ICD-10-CM | POA: Insufficient documentation

## 2014-02-27 DIAGNOSIS — B9689 Other specified bacterial agents as the cause of diseases classified elsewhere: Secondary | ICD-10-CM | POA: Insufficient documentation

## 2014-02-27 DIAGNOSIS — O26852 Spotting complicating pregnancy, second trimester: Secondary | ICD-10-CM | POA: Diagnosis present

## 2014-02-27 DIAGNOSIS — Z3A27 27 weeks gestation of pregnancy: Secondary | ICD-10-CM | POA: Diagnosis not present

## 2014-02-27 LAB — URINALYSIS, ROUTINE W REFLEX MICROSCOPIC
BILIRUBIN URINE: NEGATIVE
Glucose, UA: NEGATIVE mg/dL
Hgb urine dipstick: NEGATIVE
KETONES UR: NEGATIVE mg/dL
LEUKOCYTES UA: NEGATIVE
NITRITE: NEGATIVE
PROTEIN: NEGATIVE mg/dL
Specific Gravity, Urine: >1.03 — ABNORMAL HIGH (ref 1.005–1.030)
UROBILINOGEN UA: 1 mg/dL (ref 0.0–1.0)
pH: 6 (ref 5.0–8.0)

## 2014-02-27 LAB — WET PREP, GENITAL
Trich, Wet Prep: NONE SEEN
Yeast Wet Prep HPF POC: NONE SEEN

## 2014-02-27 MED ORDER — METRONIDAZOLE 500 MG PO TABS: 500.0000 mg | ORAL_TABLET | Freq: Two times a day (BID) | ORAL | Status: DC

## 2014-02-27 NOTE — MAU Note (Signed)
Patient noticed light pink spotting on Friday. Patient noticed brownish spotting on Saturday after intercourse and this morning.

## 2014-02-27 NOTE — MAU Provider Note (Signed)
History     CSN: [id number]  Arrival date and time: 02/27/14 1210   None     Chief Complaint  Patient presents with  . Vaginal Bleeding   HPI 28 y.o. G2P1001 at 2844w2d with spotting x 2 days. Pt states she had intercourse on Thursday, pink spotting Friday, brown spotting on Saturday following intercourse, brown spotting today. No pain. Prenatal course complicated by PIH, on labetalol, otherwise normal. Pt states she has had normal ultrasounds and that her blood type is O pos.    Past Medical History  Diagnosis Date  . Pregnancy induced hypertension   . Vaginal Pap smear, abnormal     LEEP, normal since  . Infection     UTI  . Anxiety   . Depression     Past Surgical History  Procedure Laterality Date  . Leep      Family History  Problem Relation Age of Onset  . Hypertension Mother   . Cancer Mother     rectal  . Hypertension Father   . Hypertension Maternal Grandmother   . Cancer Maternal Grandmother     breast  . Hypertension Maternal Grandfather   . Kidney disease Paternal Grandmother   . Heart disease Paternal Grandfather   . Hearing loss Neg Hx     History  Substance Use Topics  . Smoking status: Former Games developermoker  . Smokeless tobacco: Never Used     Comment: quit June 2015  . Alcohol Use: No    Allergies:  Allergies  Allergen Reactions  . Sulfa Antibiotics Swelling    Prescriptions prior to admission  Medication Sig Dispense Refill  . acetaminophen (TYLENOL) 500 MG tablet Take 1,000 mg by mouth every 6 (six) hours as needed for fever.      . labetalol (NORMODYNE) 200 MG tablet Take 1 tablet (200 mg total) by mouth 2 (two) times daily.  60 tablet  0  . Prenatal Vit-Fe Fumarate-FA (PRENATAL MULTIVITAMIN) TABS tablet Take 1 tablet by mouth daily at 12 noon.        ROS Physical Exam   Blood pressure 139/82, pulse 102, temperature 98.2 F (36.8 C), temperature source Oral, resp. rate 16, height 5\' 3"  (1.6 m), weight 197 lb (89.359 kg).  Physical  Exam  Nursing note and vitals reviewed. Constitutional: She is oriented to person, place, and time. She appears well-developed and well-nourished. No distress.  Cardiovascular: Normal rate.   Respiratory: Effort normal.  GI: Soft. There is no tenderness.  Genitourinary: There is no rash, tenderness or lesion on the right labia. There is no rash, tenderness or lesion on the left labia. Cervix exhibits discharge (scant brown mucous). Vaginal discharge (creamy white) found.  Cervix Long/thick/closed  Musculoskeletal: Normal range of motion.  Neurological: She is alert and oriented to person, place, and time.  Skin: Skin is warm and dry.  Psychiatric: She has a normal mood and affect.   EFM: 160, mod variability, no decels, reassuring at 27 weeks, TOCO quiet  MAU Course  Procedures  Results for orders placed during the hospital encounter of 02/27/14 (from the past 24 hour(s))  URINALYSIS, ROUTINE W REFLEX MICROSCOPIC     Status: Abnormal   Collection Time    02/27/14 12:19 PM      Result Value Ref Range   Color, Urine YELLOW  YELLOW   APPearance CLEAR  CLEAR   Specific Gravity, Urine >1.030 (*) 1.005 - 1.030   pH 6.0  5.0 - 8.0   Glucose, UA NEGATIVE  NEGATIVE mg/dL   Hgb urine dipstick NEGATIVE  NEGATIVE   Bilirubin Urine NEGATIVE  NEGATIVE   Ketones, ur NEGATIVE  NEGATIVE mg/dL   Protein, ur NEGATIVE  NEGATIVE mg/dL   Urobilinogen, UA 1.0  0.0 - 1.0 mg/dL   Nitrite NEGATIVE  NEGATIVE   Leukocytes, UA NEGATIVE  NEGATIVE  WET PREP, GENITAL     Status: Abnormal   Collection Time    02/27/14  1:02 PM      Result Value Ref Range   Yeast Wet Prep HPF POC NONE SEEN  NONE SEEN   Trich, Wet Prep NONE SEEN  NONE SEEN   Clue Cells Wet Prep HPF POC FEW (*) NONE SEEN   WBC, Wet Prep HPF POC FEW (*) NONE SEEN     Assessment and Plan   1. Spotting affecting pregnancy in second trimester, antepartum   2. Bacterial vaginosis       Medication List         acetaminophen 500 MG  tablet  Commonly known as:  TYLENOL  Take 1,000 mg by mouth every 6 (six) hours as needed for mild pain or headache.     labetalol 200 MG tablet  Commonly known as:  NORMODYNE  Take 400 mg by mouth 2 (two) times daily.     metroNIDAZOLE 500 MG tablet  Commonly known as:  FLAGYL  Take 1 tablet (500 mg total) by mouth 2 (two) times daily.     prenatal multivitamin Tabs tablet  Take 1 tablet by mouth daily at 12 noon.            Follow-up Information   Follow up with Philip AspenALLAHAN, SIDNEY, DO. (as scheduled or sooner as needed)    Specialty:  Obstetrics and Gynecology   Contact information:   [address] [place] [id number] [phone]         [facility]RAZ[facility],Vinnie Gombert 02/27/2014, 12:37 PM

## 2014-02-28 LAB — GC/CHLAMYDIA PROBE AMP
CT Probe RNA: NEGATIVE
GC PROBE AMP APTIMA: NEGATIVE

## 2014-03-01 ENCOUNTER — Encounter (HOSPITAL_COMMUNITY): Payer: Self-pay

## 2014-03-14 ENCOUNTER — Encounter (HOSPITAL_COMMUNITY): Payer: Self-pay

## 2014-04-05 ENCOUNTER — Observation Stay (HOSPITAL_COMMUNITY)
Admission: AD | Admit: 2014-04-05 | Discharge: 2014-04-06 | Disposition: A | Discharge status: Home / Self Care | Payer: 59 | Source: Ambulatory Visit | Attending: Obstetrics and Gynecology | Admitting: Obstetrics and Gynecology

## 2014-04-05 ENCOUNTER — Encounter (HOSPITAL_COMMUNITY): Payer: Self-pay | Admitting: *Deleted

## 2014-04-05 DIAGNOSIS — O10913 Unspecified pre-existing hypertension complicating pregnancy, third trimester: Secondary | ICD-10-CM | POA: Diagnosis not present

## 2014-04-05 DIAGNOSIS — O99343 Other mental disorders complicating pregnancy, third trimester: Secondary | ICD-10-CM | POA: Insufficient documentation

## 2014-04-05 DIAGNOSIS — Z87891 Personal history of nicotine dependence: Secondary | ICD-10-CM | POA: Insufficient documentation

## 2014-04-05 DIAGNOSIS — O288 Other abnormal findings on antenatal screening of mother: Secondary | ICD-10-CM | POA: Insufficient documentation

## 2014-04-05 DIAGNOSIS — F329 Major depressive disorder, single episode, unspecified: Secondary | ICD-10-CM | POA: Insufficient documentation

## 2014-04-05 DIAGNOSIS — Z3A32 32 weeks gestation of pregnancy: Secondary | ICD-10-CM | POA: Diagnosis not present

## 2014-04-05 DIAGNOSIS — Z87440 Personal history of urinary (tract) infections: Secondary | ICD-10-CM | POA: Diagnosis not present

## 2014-04-05 DIAGNOSIS — F419 Anxiety disorder, unspecified: Secondary | ICD-10-CM | POA: Diagnosis not present

## 2014-04-05 DIAGNOSIS — O99513 Diseases of the respiratory system complicating pregnancy, third trimester: Secondary | ICD-10-CM | POA: Insufficient documentation

## 2014-04-05 DIAGNOSIS — O99413 Diseases of the circulatory system complicating pregnancy, third trimester: Secondary | ICD-10-CM | POA: Diagnosis present

## 2014-04-05 DIAGNOSIS — J45909 Unspecified asthma, uncomplicated: Secondary | ICD-10-CM | POA: Insufficient documentation

## 2014-04-05 DIAGNOSIS — O10919 Unspecified pre-existing hypertension complicating pregnancy, unspecified trimester: Secondary | ICD-10-CM | POA: Diagnosis present

## 2014-04-05 HISTORY — DX: Unspecified asthma, uncomplicated: J45.909

## 2014-04-05 LAB — COMPREHENSIVE METABOLIC PANEL
ALBUMIN: 3.1 g/dL — AB (ref 3.5–5.2)
ALK PHOS: 73 U/L (ref 39–117)
ALT: 16 U/L (ref 0–35)
ANION GAP: 11 (ref 5–15)
AST: 17 U/L (ref 0–37)
BUN: 5 mg/dL — AB (ref 6–23)
CO2: 21 mEq/L (ref 19–32)
Calcium: 9 mg/dL (ref 8.4–10.5)
Chloride: 106 mEq/L (ref 96–112)
Creatinine, Ser: 0.57 mg/dL (ref 0.50–1.10)
GFR calc Af Amer: >90 mL/min (ref >90)
GFR calc non Af Amer: >90 mL/min (ref >90)
Glucose, Bld: 79 mg/dL (ref 70–99)
Potassium: 4 mEq/L (ref 3.7–5.3)
Sodium: 138 mEq/L (ref 137–147)
TOTAL PROTEIN: 6.8 g/dL (ref 6.0–8.3)
Total Bilirubin: <0.2 mg/dL — ABNORMAL LOW (ref 0.3–1.2)

## 2014-04-05 LAB — URINALYSIS, ROUTINE W REFLEX MICROSCOPIC
BILIRUBIN URINE: NEGATIVE
Glucose, UA: NEGATIVE mg/dL
HGB URINE DIPSTICK: NEGATIVE
Ketones, ur: 15 mg/dL — AB
Leukocytes, UA: NEGATIVE
Nitrite: NEGATIVE
PROTEIN: NEGATIVE mg/dL
Specific Gravity, Urine: >1.03 — ABNORMAL HIGH (ref 1.005–1.030)
Urobilinogen, UA: 0.2 mg/dL (ref 0.0–1.0)
pH: 6 (ref 5.0–8.0)

## 2014-04-05 LAB — CBC
HEMATOCRIT: 39.1 % (ref 36.0–46.0)
Hemoglobin: 13.4 g/dL (ref 12.0–15.0)
MCH: 29 pg (ref 26.0–34.0)
MCHC: 34.3 g/dL (ref 30.0–36.0)
MCV: 84.6 fL (ref 78.0–100.0)
Platelets: 236 10*3/uL (ref 150–400)
RBC: 4.62 MIL/uL (ref 3.87–5.11)
RDW: 14.2 % (ref 11.5–15.5)
WBC: 11.9 10*3/uL — ABNORMAL HIGH (ref 4.0–10.5)

## 2014-04-05 LAB — LACTATE DEHYDROGENASE: LDH: 149 U/L (ref 94–250)

## 2014-04-05 LAB — PROTEIN / CREATININE RATIO, URINE
CREATININE, URINE: 260.75 mg/dL
Protein Creatinine Ratio: 0.07 (ref 0.00–0.15)
TOTAL PROTEIN, URINE: 17.7 mg/dL

## 2014-04-05 LAB — URIC ACID: Uric Acid, Serum: 4.5 mg/dL (ref 2.4–7.0)

## 2014-04-05 LAB — TYPE AND SCREEN
ABO/RH(D): O POS
Antibody Screen: NEGATIVE

## 2014-04-05 MED ORDER — LORATADINE 10 MG PO TABS: 10.0000 mg | ORAL_TABLET | Freq: Every day | ORAL | Status: DC | PRN

## 2014-04-05 MED ORDER — LABETALOL HCL 300 MG PO TABS
400.0000 mg | ORAL_TABLET | Freq: Two times a day (BID) | ORAL | Status: DC
  Administered 2014-04-05 – 2014-04-06 (×2): 400 mg via ORAL
  Filled 2014-04-05 (×4): qty 1

## 2014-04-05 MED ORDER — PRENATAL MULTIVITAMIN CH
1.0000 | ORAL_TABLET | Freq: Every day | ORAL | Status: DC
  Administered 2014-04-06: 1 via ORAL
  Filled 2014-04-05 (×3): qty 1

## 2014-04-05 MED ORDER — ACETAMINOPHEN 325 MG PO TABS: 650.0000 mg | ORAL_TABLET | ORAL | Status: DC | PRN

## 2014-04-05 MED ORDER — ZOLPIDEM TARTRATE 5 MG PO TABS: 5.0000 mg | ORAL_TABLET | Freq: Every evening | ORAL | Status: DC | PRN

## 2014-04-05 MED ORDER — CALCIUM CARBONATE ANTACID 500 MG PO CHEW
2.0000 | CHEWABLE_TABLET | ORAL | Status: DC | PRN
  Filled 2014-04-05: qty 2

## 2014-04-05 MED ORDER — DOCUSATE SODIUM 100 MG PO CAPS
100.0000 mg | ORAL_CAPSULE | Freq: Every day | ORAL | Status: DC
  Administered 2014-04-06: 100 mg via ORAL
  Filled 2014-04-05 (×3): qty 1

## 2014-04-05 NOTE — Progress Notes (Signed)
Baby active 

## 2014-04-05 NOTE — MAU Note (Signed)
Patient states she was in the office for a regular visit. BPP was 6/8 and non reactive NST. Has had elevated blood pressure and sent to MAU for further evaluation.

## 2014-04-05 NOTE — MAU Provider Note (Signed)
Chief Complaint:  Hypertension and Non-stress Test   None     HPI: Kelli Wells is a 28 y.o. G2P1001 at [redacted]w[redacted]d who presents to maternity admissions sent from the office for elevated BP and BPP of 6/8.  She reports less fetal movement than normal but reports at least 5-10 movements per hour.  She reports frequent h/a in pregnancy with no recent changes in amount or intensity.  She denies epigastric pain or visual disturbances.  She denies cramping/contractions, LOF, vaginal bleeding, vaginal itching/burning, urinary symptoms, dizziness, n/v, or fever/chills.   Past Medical History: Past Medical History  Diagnosis Date  . Hypertension   . Pregnancy induced hypertension   . Vaginal Pap smear, abnormal     LEEP, normal since  . Infection     UTI  . Anxiety   . Depression     Past obstetric history: OB History  Gravida Para Term Preterm AB SAB TAB Ectopic Multiple Living  2 1 1       1     # Outcome Date GA Lbr Len/2nd Weight Sex Delivery Anes PTL Lv  2 Current           1 Term 10/13/04    F Vag-Spont EPI N Y     Comments: PIH induction      Past Surgical History: Past Surgical History  Procedure Laterality Date  . Lymph gland excision    . Leep      Family History: Family History  Problem Relation Age of Onset  . Hypertension Mother   . Cancer Mother     rectal  . Hypertension Father   . Hypertension Maternal Grandmother   . Cancer Maternal Grandmother     breast  . Hypertension Maternal Grandfather   . Kidney disease Paternal Grandmother   . Heart disease Paternal Grandfather   . Hearing loss Neg Hx     Social History: History  Substance Use Topics  . Smoking status: Former Games developer  . Smokeless tobacco: Never Used     Comment: quit June 2015  . Alcohol Use: No    Allergies:  Allergies  Allergen Reactions  . Sulfa Antibiotics Swelling  . Sulfate Swelling    Meds:  Prescriptions prior to admission  Medication Sig Dispense Refill Last Dose  .  acetaminophen (TYLENOL) 500 MG tablet Take 1,000 mg by mouth every 6 (six) hours as needed for mild pain or headache.    Past Week at Unknown time  . labetalol (NORMODYNE) 200 MG tablet Take 400 mg by mouth 2 (two) times daily.   04/05/2014 at Unknown time  . loratadine (CLARITIN) 10 MG tablet Take 10 mg by mouth daily as needed for allergies.   Past Week at Unknown time  . Prenatal Vit-Fe Fumarate-FA (PRENATAL MULTIVITAMIN) TABS tablet Take 1 tablet by mouth daily at 12 noon.   04/05/2014 at Unknown time  . ranitidine (ZANTAC) 150 MG capsule Take 150 mg by mouth 2 (two) times daily.   Past Week at Unknown time  . albuterol (PROVENTIL HFA;VENTOLIN HFA) 108 (90 BASE) MCG/ACT inhaler Inhale 2 puffs into the lungs every 6 (six) hours as needed for wheezing or shortness of breath.   More than a month at Unknown time  . labetalol (NORMODYNE) 200 MG tablet Take 200 mg by mouth 2 (two) times daily.   02/17/2014 at 0800  . [DISCONTINUED] metroNIDAZOLE (FLAGYL) 500 MG tablet Take 1 tablet (500 mg total) by mouth 2 (two) times daily. (Patient not taking: Reported on  04/05/2014) 14 tablet 0     ROS: Pertinent findings in history of present illness.  Physical Exam  Blood pressure 148/79, pulse 108, temperature 98.7 F (37.1 C), resp. rate 16, height 5' 3.5" (1.613 m), weight 90.901 kg (200 lb 6.4 oz), last menstrual period 08/20/2013, SpO2 100 %. Patient Vitals for the past 24 hrs:  BP Temp Temp src Pulse Resp SpO2 Height Weight  04/05/14 1417 148/79 mmHg - - 108 - - - -  04/05/14 1402 140/76 mmHg - - - - - - -  04/05/14 1348 149/80 mmHg - - 93 - - - -  04/05/14 1332 158/93 mmHg - - 95 - - - -  04/05/14 1317 153/90 mmHg - - 97 - - - -  04/05/14 1304 142/97 mmHg - - 96 - - - -  04/05/14 1220 - 98.7 F (37.1 C) - - - - - -  04/05/14 1219 147/87 mmHg - Oral 98 16 100 % 5' 3.5" (1.613 m) 90.901 kg (200 lb 6.4 oz)   GENERAL: Well-developed, well-nourished female in no acute distress.  HEENT:  normocephalic HEART: normal rate RESP: normal effort ABDOMEN: Soft, non-tender, gravid appropriate for gestational age EXTREMITIES: Nontender, no edema NEURO: alert and oriented    FHT:  Baseline 145, moderate variability, accelerations present (10 x 10 and one 15 x 15) but not two in 20 minutes during NST, no decelerations Contractions: None on toco or to palpation   Labs: Results for orders placed or performed during the hospital encounter of 04/05/14 (from the past 24 hour(s))  Protein / creatinine ratio, urine     Status: None   Collection Time: 04/05/14 12:25 PM  Result Value Ref Range   Creatinine, Urine 260.75 mg/dL   Total Protein, Urine 17.7 mg/dL   Protein Creatinine Ratio 0.07 0.00 - 0.15  Urinalysis, Routine w reflex microscopic     Status: Abnormal   Collection Time: 04/05/14 12:25 PM  Result Value Ref Range   Color, Urine YELLOW YELLOW   APPearance CLEAR CLEAR   Specific Gravity, Urine >1.030 (H) 1.005 - 1.030   pH 6.0 5.0 - 8.0   Glucose, UA NEGATIVE NEGATIVE mg/dL   Hgb urine dipstick NEGATIVE NEGATIVE   Bilirubin Urine NEGATIVE NEGATIVE   Ketones, ur 15 (A) NEGATIVE mg/dL   Protein, ur NEGATIVE NEGATIVE mg/dL   Urobilinogen, UA 0.2 0.0 - 1.0 mg/dL   Nitrite NEGATIVE NEGATIVE   Leukocytes, UA NEGATIVE NEGATIVE  CBC     Status: Abnormal   Collection Time: 04/05/14 12:39 PM  Result Value Ref Range   WBC 11.9 (H) 4.0 - 10.5 K/uL   RBC 4.62 3.87 - 5.11 MIL/uL   Hemoglobin 13.4 12.0 - 15.0 g/dL   HCT 16.139.1 09.636.0 - 04.546.0 %   MCV 84.6 78.0 - 100.0 fL   MCH 29.0 26.0 - 34.0 pg   MCHC 34.3 30.0 - 36.0 g/dL   RDW 40.914.2 81.111.5 - 91.415.5 %   Platelets 236 150 - 400 K/uL  Comprehensive metabolic panel     Status: Abnormal   Collection Time: 04/05/14 12:39 PM  Result Value Ref Range   Sodium 138 137 - 147 mEq/L   Potassium 4.0 3.7 - 5.3 mEq/L   Chloride 106 96 - 112 mEq/L   CO2 21 19 - 32 mEq/L   Glucose, Bld 79 70 - 99 mg/dL   BUN 5 (L) 6 - 23 mg/dL   Creatinine, Ser  7.820.57 0.50 - 1.10 mg/dL   Calcium 9.0  8.4 - 10.5 mg/dL   Total Protein 6.8 6.0 - 8.3 g/dL   Albumin 3.1 (L) 3.5 - 5.2 g/dL   AST 17 0 - 37 U/L   ALT 16 0 - 35 U/L   Alkaline Phosphatase 73 39 - 117 U/L   Total Bilirubin <0.2 (L) 0.3 - 1.2 mg/dL   GFR calc non Af Amer >90 >90 mL/min   GFR calc Af Amer >90 >90 mL/min   Anion gap 11 5 - 15  Uric acid     Status: None   Collection Time: 04/05/14 12:39 PM  Result Value Ref Range   Uric Acid, Serum 4.5 2.4 - 7.0 mg/dL  Lactate dehydrogenase     Status: None   Collection Time: 04/05/14 12:39 PM  Result Value Ref Range   LDH 149 94 - 250 U/L     Assessment: 1. Non-reactive NST (non-stress test)     Plan: Consult Dr Tenny Crawoss Admit for observation Continuous EFM Repeat BPP in am     Medication List    ASK your doctor about these medications        acetaminophen 500 MG tablet  Commonly known as:  TYLENOL  Take 1,000 mg by mouth every 6 (six) hours as needed for mild pain or headache.     albuterol 108 (90 BASE) MCG/ACT inhaler  Commonly known as:  PROVENTIL HFA;VENTOLIN HFA  Inhale 2 puffs into the lungs every 6 (six) hours as needed for wheezing or shortness of breath.     labetalol 200 MG tablet  Commonly known as:  NORMODYNE  Take 200 mg by mouth 2 (two) times daily.     labetalol 200 MG tablet  Commonly known as:  NORMODYNE  Take 400 mg by mouth 2 (two) times daily.     loratadine 10 MG tablet  Commonly known as:  CLARITIN  Take 10 mg by mouth daily as needed for allergies.     prenatal multivitamin Tabs tablet  Take 1 tablet by mouth daily at 12 noon.     ranitidine 150 MG capsule  Commonly known as:  ZANTAC  Take 150 mg by mouth 2 (two) times daily.        Sharen CounterLisa Leftwich-Kirby Certified Nurse-Midwife 04/05/2014 2:49 PM

## 2014-04-05 NOTE — Plan of Care (Signed)
Problem: Consults Goal: Antepartum Patient Education Outcome: Completed/Met Date Met:  04/05/14 Goal: Birthing Suites Patient Information Press F2 to bring up selections list  Pt < [redacted] weeks EGA Goal: Skin Care Protocol Initiated - if Braden Score 18 or less If consults are not indicated, leave blank or document N/A Outcome: Not Applicable Date Met:  09/47/09 Goal: Diabetes Guidelines per MD order/protocol Outcome: Not Applicable Date Met:  62/83/66 Goal: Lactation Consult Initiated if indicated Outcome: Not Applicable Date Met:  29/47/65 Goal: Neonatologist Consult Outcome: Not Applicable Date Met:  46/50/35 Goal: Physical Therapy Consult Outcome: Not Applicable Date Met:  46/56/81 Goal: NICU Tour Outcome: Not Applicable Date Met:  27/51/70 Goal: Orientation to unit: [facility] (smoking, visitation, chaplain services, helpline) Outcome: Completed/Met Date Met:  04/05/14

## 2014-04-05 NOTE — MAU Note (Signed)
Up to BR.

## 2014-04-05 NOTE — H&P (Signed)
Kelli Wells is a 28 y.o. female presenting for evaluation of a 6/8 BPP and Non-reactive NST  28 Yo G2P1001 @ 32+4 w/ chronic hypertension presents from the office for further evaluation of a 6/8 BPP and non-reactive NST. The Pt was having a BPP/EFW today in the [facility]. Her BPP was 6/8, -2 for no fetal breathing movements. Pt was placed on the NST nad while the tracing was reassuring she it never became reactive. Pts pregnancy [facility] which she is now on 400mg  labetalol BID.  PIH eval in MAU was WNL however fetal tracing remained non-reactive. Therefore, the decision was made to admit the patient for 23 hour obs and repeat the BPP in the morning.   History OB History    Gravida Para Term Preterm AB TAB SAB Ectopic Multiple Living   2 1 1       1      Past Medical History  Diagnosis Date  . Hypertension   . Pregnancy induced hypertension   . Vaginal Pap smear, abnormal     LEEP, normal since  . Infection     UTI  . Anxiety   . Depression   . Asthma    Past Surgical History  Procedure Laterality Date  . Lymph gland excision    . Leep     Family History: family history includes Cancer in her maternal grandmother and mother; Heart disease in her paternal grandfather; Hypertension in her father, maternal grandfather, maternal grandmother, and mother; Kidney disease in her paternal grandmother. There is no history of Hearing loss. Social History:  reports that she has quit smoking. She has never used smokeless tobacco. She reports that she does not drink alcohol or use illicit drugs.   Prenatal Transfer Tool  Maternal Diabetes: no Genetic Screening: normal Maternal Ultrasounds/Referrals: Normal female fetal anatomy Fetal Ultrasounds or other Referrals:  EFW 3#13, AFI 11.14 04/05/2014 Maternal Substance Abuse:  no Significant Maternal Medications: labetalol 400mg  BID Significant Maternal Lab Results:  PIH labs WNL Other  Comments:  none  ROS: chronic headache, increased peripheral swelling    Blood pressure 119/61, pulse 89, temperature 97.6 F (36.4 C), temperature source Oral, resp. rate 18, height 5' 3.5" (1.613 m), weight 90.901 kg (200 lb 6.4 oz), last menstrual period 08/20/2013, SpO2 100 %. Exam Physical Exam  Prenatal labs: ABO, Rh: --/--/O POS (11/24 1933) Antibody: NEG (11/24 1933) Rubella:  Immune RPR:   NR HBsAg:   NR HIV:   NR GBS:   not yet performed  Results for orders placed or performed during the hospital encounter of 04/05/14 (from the past 24 hour(s))  Protein / creatinine ratio, urine     Status: None   Collection Time: 04/05/14 12:25 PM  Result Value Ref Range   Creatinine, Urine 260.75 mg/dL   Total Protein, Urine 17.7 mg/dL   Protein Creatinine Ratio 0.07 0.00 - 0.15  Urinalysis, Routine w reflex microscopic     Status: Abnormal   Collection Time: 04/05/14 12:25 PM  Result Value Ref Range   Color, Urine YELLOW YELLOW   APPearance CLEAR CLEAR   Specific Gravity, Urine >1.030 (H) 1.005 - 1.030   pH 6.0 5.0 - 8.0   Glucose, UA NEGATIVE NEGATIVE mg/dL   Hgb urine dipstick NEGATIVE NEGATIVE   Bilirubin Urine NEGATIVE NEGATIVE   Ketones, ur 15 (A) NEGATIVE mg/dL   Protein, ur NEGATIVE NEGATIVE mg/dL   Urobilinogen, UA 0.2 0.0 - 1.0 mg/dL  Nitrite NEGATIVE NEGATIVE   Leukocytes, UA NEGATIVE NEGATIVE  CBC     Status: Abnormal   Collection Time: 04/05/14 12:39 PM  Result Value Ref Range   WBC 11.9 (H) 4.0 - 10.5 K/uL   RBC 4.62 3.87 - 5.11 MIL/uL   Hemoglobin 13.4 12.0 - 15.0 g/dL   HCT 16.139.1 09.636.0 - 04.546.0 %   MCV 84.6 78.0 - 100.0 fL   MCH 29.0 26.0 - 34.0 pg   MCHC 34.3 30.0 - 36.0 g/dL   RDW 40.914.2 81.111.5 - 91.415.5 %   Platelets 236 150 - 400 K/uL  Comprehensive metabolic panel     Status: Abnormal   Collection Time: 04/05/14 12:39 PM  Result Value Ref Range   Sodium 138 137 - 147 mEq/L   Potassium 4.0 3.7 - 5.3 mEq/L   Chloride 106 96 - 112 mEq/L   CO2 21 19 - 32  mEq/L   Glucose, Bld 79 70 - 99 mg/dL   BUN 5 (L) 6 - 23 mg/dL   Creatinine, Ser 7.820.57 0.50 - 1.10 mg/dL   Calcium 9.0 8.4 - 95.610.5 mg/dL   Total Protein 6.8 6.0 - 8.3 g/dL   Albumin 3.1 (L) 3.5 - 5.2 g/dL   AST 17 0 - 37 U/L   ALT 16 0 - 35 U/L   Alkaline Phosphatase 73 39 - 117 U/L   Total Bilirubin <0.2 (L) 0.3 - 1.2 mg/dL   GFR calc non Af Amer >90 >90 mL/min   GFR calc Af Amer >90 >90 mL/min   Anion gap 11 5 - 15  Uric acid     Status: None   Collection Time: 04/05/14 12:39 PM  Result Value Ref Range   Uric Acid, Serum 4.5 2.4 - 7.0 mg/dL  Lactate dehydrogenase     Status: None   Collection Time: 04/05/14 12:39 PM  Result Value Ref Range   LDH 149 94 - 250 U/L  Type and screen     Status: None   Collection Time: 04/05/14  7:33 PM  Result Value Ref Range   ABO/RH(D) O POS    Antibody Screen NEG    Sample Expiration 04/08/2014      Assessment/Plan: 1) Admit 23 hour obs, continuous monitoring 2) Repeat BPP in am 3) Serial BPs  Kelli Wells H. 04/05/2014, 10:39 PM

## 2014-04-05 NOTE — Progress Notes (Signed)
Sprite to pt  °

## 2014-04-05 NOTE — Progress Notes (Signed)
[facility]. Pt will go to [phone]

## 2014-04-06 ENCOUNTER — Inpatient Hospital Stay (HOSPITAL_COMMUNITY): Payer: 59

## 2014-04-06 DIAGNOSIS — Z3A32 32 weeks gestation of pregnancy: Secondary | ICD-10-CM | POA: Insufficient documentation

## 2014-04-06 DIAGNOSIS — O288 Other abnormal findings on antenatal screening of mother: Secondary | ICD-10-CM | POA: Insufficient documentation

## 2014-04-06 DIAGNOSIS — O10919 Unspecified pre-existing hypertension complicating pregnancy, unspecified trimester: Secondary | ICD-10-CM | POA: Diagnosis present

## 2014-04-06 DIAGNOSIS — O99413 Diseases of the circulatory system complicating pregnancy, third trimester: Secondary | ICD-10-CM | POA: Diagnosis not present

## 2014-04-06 LAB — ABO/RH: ABO/RH(D): O POS

## 2014-04-06 NOTE — Progress Notes (Signed)
PT given discharge instructions and instruction on fetal kick counts.  Pt verlbalizes understanding.

## 2014-04-06 NOTE — Discharge Summary (Signed)
Obstetric Discharge Summary Reason for Admission: observation/evaluation Prenatal Procedures: NST and ultrasound Intrapartum Procedures: NA Postpartum Procedures: NA undelivered Complications-Operative and Postpartum: NA undelivered HEMOGLOBIN  Date Value Ref Range Status  04/05/2014 13.4 12.0 - 15.0 g/dL Final   HCT  Date Value Ref Range Status  04/05/2014 39.1 36.0 - 46.0 % Final    Physical Exam:  General: alert, cooperative and appears stated age FHR 150 reassuring BPP 8/8 Cvx deferred  Discharge Diagnoses: Chronic hypertension in pregnancy, Equivical BPP w/ non-reactive NST/ 23 hour obs  Patient was admitted for 23 hour observation for equivocal BPP & non-reactive NST. Pt was monitored overnight and had a reassuring fetal tracing. NST never reactive but BPP 8/8 on morning of discharge.   Discharge Information: Date: 04/06/2014 Activity: unrestricted Diet: routine Medications: PNV and Labetalol 400mg  BID Condition: improved Instructions: refer to practice specific booklet   Pt to keep follow-up visit in OB office next week  Discharge to: home .Kelli Wells H. 04/06/2014, 11:02 AM

## 2014-04-12 ENCOUNTER — Encounter (HOSPITAL_COMMUNITY): Payer: Self-pay | Admitting: *Deleted

## 2014-04-12 ENCOUNTER — Inpatient Hospital Stay (HOSPITAL_COMMUNITY): Payer: 59

## 2014-04-12 ENCOUNTER — Inpatient Hospital Stay (HOSPITAL_COMMUNITY)
Admission: AD | Admit: 2014-04-12 | Discharge: 2014-04-12 | Disposition: A | Discharge status: Home / Self Care | Payer: 59 | Source: Ambulatory Visit | Attending: Obstetrics & Gynecology | Admitting: Obstetrics & Gynecology

## 2014-04-12 DIAGNOSIS — Z87891 Personal history of nicotine dependence: Secondary | ICD-10-CM | POA: Insufficient documentation

## 2014-04-12 DIAGNOSIS — O10913 Unspecified pre-existing hypertension complicating pregnancy, third trimester: Secondary | ICD-10-CM | POA: Insufficient documentation

## 2014-04-12 DIAGNOSIS — O368131 Decreased fetal movements, third trimester, fetus 1: Secondary | ICD-10-CM

## 2014-04-12 DIAGNOSIS — Z3A33 33 weeks gestation of pregnancy: Secondary | ICD-10-CM | POA: Diagnosis not present

## 2014-04-12 DIAGNOSIS — O368130 Decreased fetal movements, third trimester, not applicable or unspecified: Secondary | ICD-10-CM | POA: Diagnosis present

## 2014-04-12 DIAGNOSIS — O26853 Spotting complicating pregnancy, third trimester: Secondary | ICD-10-CM | POA: Diagnosis not present

## 2014-04-12 DIAGNOSIS — O288 Other abnormal findings on antenatal screening of mother: Secondary | ICD-10-CM | POA: Diagnosis present

## 2014-04-12 DIAGNOSIS — O10919 Unspecified pre-existing hypertension complicating pregnancy, unspecified trimester: Secondary | ICD-10-CM | POA: Insufficient documentation

## 2014-04-12 LAB — URINE MICROSCOPIC-ADD ON

## 2014-04-12 LAB — WET PREP, GENITAL
Clue Cells Wet Prep HPF POC: NONE SEEN
Trich, Wet Prep: NONE SEEN
YEAST WET PREP: NONE SEEN

## 2014-04-12 LAB — URINALYSIS, ROUTINE W REFLEX MICROSCOPIC
BILIRUBIN URINE: NEGATIVE
GLUCOSE, UA: NEGATIVE mg/dL
KETONES UR: NEGATIVE mg/dL
Leukocytes, UA: NEGATIVE
Nitrite: NEGATIVE
PH: 6 (ref 5.0–8.0)
Protein, ur: NEGATIVE mg/dL
Specific Gravity, Urine: >1.03 — ABNORMAL HIGH (ref 1.005–1.030)
Urobilinogen, UA: 0.2 mg/dL (ref 0.0–1.0)

## 2014-04-12 MED ORDER — DEXTROSE 5 % IN LACTATED RINGERS IV BOLUS
1000.0000 mL | Freq: Once | INTRAVENOUS | Status: AC
  Administered 2014-04-12: 1000 mL via INTRAVENOUS

## 2014-04-12 NOTE — Discharge Instructions (Signed)
Braxton Hicks Contractions °Contractions of the uterus can occur throughout pregnancy. Contractions are not always a sign that you are in labor.  °WHAT ARE BRAXTON HICKS CONTRACTIONS?  °Contractions that occur before labor are called Braxton Hicks contractions, or false labor. Toward the end of pregnancy (32-34 weeks), these contractions can develop more often and may become more forceful. This is not true labor because these contractions do not result in opening (dilatation) and thinning of the cervix. They are sometimes difficult to tell apart from true labor because these contractions can be forceful and people have different pain tolerances. You should not feel embarrassed if you go to the hospital with false labor. Sometimes, the only way to tell if you are in true labor is for your health care provider to look for changes in the cervix. °If there are no prenatal problems or other health problems associated with the pregnancy, it is completely safe to be sent home with false labor and await the onset of true labor. °HOW CAN YOU TELL THE DIFFERENCE BETWEEN TRUE AND FALSE LABOR? °False Labor °· The contractions of false labor are usually shorter and not as hard as those of true labor.   °· The contractions are usually irregular.   °· The contractions are often felt in the front of the lower abdomen and in the groin.   °· The contractions may go away when you walk around or change positions while lying down.   °· The contractions get weaker and are shorter lasting as time goes on.   °· The contractions do not usually become progressively stronger, regular, and closer together as with true labor.   °True Labor °1. Contractions in true labor last 30-70 seconds, become very regular, usually become more intense, and increase in frequency.   °2. The contractions do not go away with walking.   °3. The discomfort is usually felt in the top of the uterus and spreads to the lower abdomen and low back.   °4. True labor can  be determined by your health care provider with an exam. This will show that the cervix is dilating and getting thinner.   °WHAT TO REMEMBER °· Keep up with your usual exercises and follow other instructions given by your health care provider.   °· Take medicines as directed by your health care provider.   °· Keep your regular prenatal appointments.   °· Eat and drink lightly if you think you are going into labor.   °· If Braxton Hicks contractions are making you uncomfortable:   °· Change your position from lying down or resting to walking, or from walking to resting.   °· Sit and rest in a tub of warm water.   °· Drink 2-3 glasses of water. Dehydration may cause these contractions.   °· Do slow and deep breathing several times an hour.   °WHEN SHOULD I SEEK IMMEDIATE MEDICAL CARE? °Seek immediate medical care if: °· Your contractions become stronger, more regular, and closer together.   °· You have fluid leaking or gushing from your vagina.   °· You have a fever.   °· You pass blood-tinged mucus.   °· You have vaginal bleeding.   °· You have continuous abdominal pain.   °· You have low back pain that you never had before.   °· You feel your baby's head pushing down and causing pelvic pressure.   °· Your baby is not moving as much as it used to.   °Document Released: 04/29/2005 Document Revised: 05/04/2013 Document Reviewed: 02/08/2013 °ExitCare® Patient Information ©2015 [employer]. This information is not intended to replace advice given to you by your health care   provider. Make sure you discuss any questions you have with your health care provider.  Vaginal Bleeding During Pregnancy, Third Trimester A small amount of bleeding (spotting) from the vagina is relatively common in pregnancy. Various things can cause bleeding or spotting in pregnancy. Sometimes the bleeding is normal and is not a problem. However, bleeding during the third trimester can also be a sign of something serious for the mother and the  baby. Be sure to tell your health care provider about any vaginal bleeding right away.  Some possible causes of vaginal bleeding during the third trimester include:   The placenta may be partially or completely covering the opening to the cervix (placenta previa).   The placenta may have separated from the uterus (abruption of the placenta).   There may be an infection or growth on the cervix.   You may be starting labor, called discharging of the mucus plug.   The placenta may grow into the muscle layer of the uterus (placenta accreta).  HOME CARE INSTRUCTIONS  Watch your condition for any changes. The following actions may help to lessen any discomfort you are feeling:  5. Follow your health care provider's instructions for limiting your activity. If your health care provider orders bed rest, you may need to stay in bed and only get up to use the bathroom. However, your health care provider may allow you to continue light activity. 6. If needed, make plans for someone to help with your regular activities and responsibilities while you are on bed rest. 7. Keep track of the number of pads you use each day, how often you change pads, and how soaked (saturated) they are. Write this down. 8. Do not use tampons. Do not douche. 9. Do not have sexual intercourse or orgasms until approved by your health care provider. 10. Follow your health care provider's advice about lifting, driving, and physical activities. 11. If you pass any tissue from your vagina, save the tissue so you can show it to your health care provider.  12. Only take over-the-counter or prescription medicines as directed by your health care provider. 13. Do not take aspirin because it can make you bleed.  14. Keep all follow-up appointments as directed by your health care provider. SEEK MEDICAL CARE IF:  You have any vaginal bleeding during any part of your pregnancy.  You have cramps or labor pains.  You have a fever,  not controlled by medicine. SEEK IMMEDIATE MEDICAL CARE IF:   You have severe cramps or pain in your back or belly (abdomen).  You have chills.  You have a gush of fluid from the vagina.  You pass large clots or tissue from your vagina.  Your bleeding increases.  You feel light-headed or weak.  You pass out.  You feel less movement or no movement of the baby.  MAKE SURE YOU:  Understand these instructions.  Will watch your condition.  Will get help right away if you are not doing well or get worse. Document Released: 07/20/2002 Document Revised: 05/04/2013 Document Reviewed: 01/04/2013 [facility] Patient Information 2015 [place], [place]. This information is not intended to replace advice given to you by your health care provider. Make sure you discuss any questions you have with your health care provider.  Fetal Movement Counts Patient Name: __________________________________________________ Patient Due Date: ____________________ Performing a fetal movement count is highly recommended in high-risk pregnancies, but it is good for every pregnant woman to do. Your health care provider may ask you to start  counting fetal movements at 28 weeks of the pregnancy. Fetal movements often increase:  After eating a full meal.  After physical activity.  After eating or drinking something sweet or cold.  At rest. Pay attention to when you feel the baby is most active. This will help you notice a pattern of your baby's sleep and wake cycles and what factors contribute to an increase in fetal movement. It is important to perform a fetal movement count at the same time each day when your baby is normally most active.  HOW TO COUNT FETAL MOVEMENTS 15. Find a quiet and comfortable area to sit or lie down on your left side. Lying on your left side provides the best blood and oxygen circulation to your baby. 16. Write down the day and time on a sheet of paper or in a journal. 17. Start counting  kicks, flutters, swishes, rolls, or jabs in a 2-hour period. You should feel at least 10 movements within 2 hours. 18. If you do not feel 10 movements in 2 hours, wait 2-3 hours and count again. Look for a change in the pattern or not enough counts in 2 hours. SEEK MEDICAL CARE IF:  You feel less than 10 counts in 2 hours, tried twice.  There is no movement in over an hour.  The pattern is changing or taking longer each day to reach 10 counts in 2 hours.  You feel the baby is not moving as he or she usually does. Date: ____________ Movements: ____________ Start time: ____________ Doreatha MartinFinish time: ____________  Date: ____________ Movements: ____________ Start time: ____________ Doreatha MartinFinish time: ____________ Date: ____________ Movements: ____________ Start time: ____________ Doreatha MartinFinish time: ____________ Date: ____________ Movements: ____________ Start time: ____________ Doreatha MartinFinish time: ____________ Date: ____________ Movements: ____________ Start time: ____________ Doreatha MartinFinish time: ____________ Date: ____________ Movements: ____________ Start time: ____________ Doreatha MartinFinish time: ____________ Date: ____________ Movements: ____________ Start time: ____________ Doreatha MartinFinish time: ____________ Date: ____________ Movements: ____________ Start time: ____________ Doreatha MartinFinish time: ____________  Date: ____________ Movements: ____________ Start time: ____________ Doreatha MartinFinish time: ____________ Date: ____________ Movements: ____________ Start time: ____________ Doreatha MartinFinish time: ____________ Date: ____________ Movements: ____________ Start time: ____________ Doreatha MartinFinish time: ____________ Date: ____________ Movements: ____________ Start time: ____________ Doreatha MartinFinish time: ____________ Date: ____________ Movements: ____________ Start time: ____________ Doreatha MartinFinish time: ____________ Date: ____________ Movements: ____________ Start time: ____________ Doreatha MartinFinish time: ____________ Date: ____________ Movements: ____________ Start time: ____________ Doreatha MartinFinish time:  ____________  Date: ____________ Movements: ____________ Start time: ____________ Doreatha MartinFinish time: ____________ Date: ____________ Movements: ____________ Start time: ____________ Doreatha MartinFinish time: ____________ Date: ____________ Movements: ____________ Start time: ____________ Doreatha MartinFinish time: ____________ Date: ____________ Movements: ____________ Start time: ____________ Doreatha MartinFinish time: ____________ Date: ____________ Movements: ____________ Start time: ____________ Doreatha MartinFinish time: ____________ Date: ____________ Movements: ____________ Start time: ____________ Doreatha MartinFinish time: ____________ Date: ____________ Movements: ____________ Start time: ____________ Doreatha MartinFinish time: ____________  Date: ____________ Movements: ____________ Start time: ____________ Doreatha MartinFinish time: ____________ Date: ____________ Movements: ____________ Start time: ____________ Doreatha MartinFinish time: ____________ Date: ____________ Movements: ____________ Start time: ____________ Doreatha MartinFinish time: ____________ Date: ____________ Movements: ____________ Start time: ____________ Doreatha MartinFinish time: ____________ Date: ____________ Movements: ____________ Start time: ____________ Doreatha MartinFinish time: ____________ Date: ____________ Movements: ____________ Start time: ____________ Doreatha MartinFinish time: ____________ Date: ____________ Movements: ____________ Start time: ____________ Doreatha MartinFinish time: ____________  Date: ____________ Movements: ____________ Start time: ____________ Doreatha MartinFinish time: ____________ Date: ____________ Movements: ____________ Start time: ____________ Doreatha MartinFinish time: ____________ Date: ____________ Movements: ____________ Start time: ____________ Doreatha MartinFinish time: ____________ Date: ____________ Movements: ____________ Start time: ____________ Doreatha MartinFinish time: ____________ Date: ____________ Movements: ____________ Start time: ____________ Doreatha MartinFinish  time: ____________ Date: ____________ Movements: ____________ Start time: ____________ Doreatha Martin time: ____________ Date: ____________ Movements:  ____________ Start time: ____________ Doreatha Martin time: ____________  Date: ____________ Movements: ____________ Start time: ____________ Doreatha Martin time: ____________ Date: ____________ Movements: ____________ Start time: ____________ Doreatha Martin time: ____________ Date: ____________ Movements: ____________ Start time: ____________ Doreatha Martin time: ____________ Date: ____________ Movements: ____________ Start time: ____________ Doreatha Martin time: ____________ Date: ____________ Movements: ____________ Start time: ____________ Doreatha Martin time: ____________ Date: ____________ Movements: ____________ Start time: ____________ Doreatha Martin time: ____________ Date: ____________ Movements: ____________ Start time: ____________ Doreatha Martin time: ____________  Date: ____________ Movements: ____________ Start time: ____________ Doreatha Martin time: ____________ Date: ____________ Movements: ____________ Start time: ____________ Doreatha Martin time: ____________ Date: ____________ Movements: ____________ Start time: ____________ Doreatha Martin time: ____________ Date: ____________ Movements: ____________ Start time: ____________ Doreatha Martin time: ____________ Date: ____________ Movements: ____________ Start time: ____________ Doreatha Martin time: ____________ Date: ____________ Movements: ____________ Start time: ____________ Doreatha Martin time: ____________ Date: ____________ Movements: ____________ Start time: ____________ Doreatha Martin time: ____________  Date: ____________ Movements: ____________ Start time: ____________ Doreatha Martin time: ____________ Date: ____________ Movements: ____________ Start time: ____________ Doreatha Martin time: ____________ Date: ____________ Movements: ____________ Start time: ____________ Doreatha Martin time: ____________ Date: ____________ Movements: ____________ Start time: ____________ Doreatha Martin time: ____________ Date: ____________ Movements: ____________ Start time: ____________ Doreatha Martin time: ____________ Date: ____________ Movements: ____________ Start time: ____________ Doreatha Martin  time: ____________ Document Released: 05/29/2006 Document Revised: 09/13/2013 Document Reviewed: 02/24/2012 Exit[employer] Patient Information 2015 [employer]. This information is not intended to replace advice given to you by your health care provider. Make sure you discuss any questions you have with your health care provider.

## 2014-04-12 NOTE — MAU Provider Note (Signed)
Chief Complaint:  Abdominal Pain   First Provider Initiated Contact with Patient 04/12/14 0957     HPI: Kelli Wells is a 28 y.o. G2P1001 at [id number] who presents to maternity admissions reporting: 1. Intermittent low abd pressure, possible contractions 3-4 times per day for the past week. 2. Spotting November 27 of November 28. Last intercourse November 27. No other spotting this pregnancy. Per office ultrasound placenta is posterior. Blood type O+. 3. Decreased fetal movement.  Denies fever, chills, urinary complaints, GI complaints, leakage of fluid  Pregnancy Course: In antenatal testing for Chronic hypertension.   Past Medical History: Past Medical History  Diagnosis Date  . Hypertension   . Pregnancy induced hypertension   . Vaginal Pap smear, abnormal     LEEP, normal since  . Infection     UTI  . Anxiety   . Depression   . Asthma     Past obstetric history: OB History  Gravida Para Term Preterm AB SAB TAB Ectopic Multiple Living  2 1 1       1     # Outcome Date GA Lbr Len/2nd Weight Sex Delivery Anes PTL Lv  2 Current           1 Term 10/13/04    F Vag-Spont EPI N Y     Comments: PIH induction      Past Surgical History: Past Surgical History  Procedure Laterality Date  . Lymph gland excision    . Leep       Family History: Family History  Problem Relation Age of Onset  . Hypertension Mother   . Cancer Mother     rectal  . Hypertension Father   . Hypertension Maternal Grandmother   . Cancer Maternal Grandmother     breast  . Hypertension Maternal Grandfather   . Kidney disease Paternal Grandmother   . Heart disease Paternal Grandfather   . Hearing loss Neg Hx     Social History: History  Substance Use Topics  . Smoking status: Former Games developermoker  . Smokeless tobacco: Never Used     Comment: quit June 2015  . Alcohol Use: No    Allergies:  Allergies  Allergen Reactions  . Sulfa Antibiotics Swelling  . Sulfate Swelling    Meds:   Prescriptions prior to admission  Medication Sig Dispense Refill Last Dose  . acetaminophen (TYLENOL) 500 MG tablet Take 1,000 mg by mouth every 6 (six) hours as needed for mild pain or headache.    Past Week at Unknown time  . labetalol (NORMODYNE) 200 MG tablet Take 400 mg by mouth 2 (two) times daily.   04/12/2014 at 0800  . loratadine (CLARITIN) 10 MG tablet Take 10 mg by mouth daily as needed for allergies.   Past Month at Unknown time  . Prenatal Vit-Fe Fumarate-FA (PRENATAL MULTIVITAMIN) TABS tablet Take 1 tablet by mouth daily at 12 noon.   04/12/2014 at Unknown time  . ranitidine (ZANTAC) 150 MG capsule Take 150 mg by mouth 2 (two) times daily.   Past Month at Unknown time  . albuterol (PROVENTIL HFA;VENTOLIN HFA) 108 (90 BASE) MCG/ACT inhaler Inhale 2 puffs into the lungs every 6 (six) hours as needed for wheezing or shortness of breath.   More than a month at Unknown time    ROS: Pertinent findings in history of present illness.  Physical Exam  Blood pressure 137/79, pulse 96, temperature 98.6 F (37 C), temperature source Oral, resp. rate 18, height 5' 3.5" (1.613 m), weight  92.08 kg (203 lb), last menstrual period 08/20/2013. GENERAL: Well-developed, well-nourished female in no acute distress.  HEENT: normocephalic HEART: normal rate RESP: normal effort ABDOMEN: Soft, non-tender, gravid appropriate for gestational age EXTREMITIES: Nontender, no edema NEURO: alert and oriented SPECULUM EXAM: NEFG, faintly tan, creamy white, odorless discharge, no frank blood, cervix clean Dilation: Closed Effacement (%): 50 Cervical Position: Middle Station: Ballotable Presentation: Undeterminable Exam by:: Dorathy Kinsman, CNM  FHT:  Baseline 145 , moderate variability, 10x10 accelerations present, few 10 sec variable decelerations Contractions: None   Labs: Results for orders placed or performed during the hospital encounter of 04/12/14 (from the past 24 hour(s))  Urinalysis, Routine w  reflex microscopic     Status: Abnormal   Collection Time: 04/12/14  9:20 AM  Result Value Ref Range   Color, Urine YELLOW YELLOW   APPearance CLOUDY (A) CLEAR   Specific Gravity, Urine >1.030 (H) 1.005 - 1.030   pH 6.0 5.0 - 8.0   Glucose, UA NEGATIVE NEGATIVE mg/dL   Hgb urine dipstick SMALL (A) NEGATIVE   Bilirubin Urine NEGATIVE NEGATIVE   Ketones, ur NEGATIVE NEGATIVE mg/dL   Protein, ur NEGATIVE NEGATIVE mg/dL   Urobilinogen, UA 0.2 0.0 - 1.0 mg/dL   Nitrite NEGATIVE NEGATIVE   Leukocytes, UA NEGATIVE NEGATIVE  Urine microscopic-add on     Status: Abnormal   Collection Time: 04/12/14  9:20 AM  Result Value Ref Range   Squamous Epithelial / LPF MANY (A) RARE   WBC, UA 0-2 <3 WBC/hpf   RBC / HPF 0-2 <3 RBC/hpf   Bacteria, UA FEW (A) RARE  Wet prep, genital     Status: Abnormal   Collection Time: 04/12/14 10:06 AM  Result Value Ref Range   Yeast Wet Prep HPF POC NONE SEEN NONE SEEN   Trich, Wet Prep NONE SEEN NONE SEEN   Clue Cells Wet Prep HPF POC NONE SEEN NONE SEEN   WBC, Wet Prep HPF POC FEW (A) NONE SEEN    Imaging:  BPP 8/8. AFI 12.18 cm (35%)  MAU Course: Cramping resolved and fetal mvmt increased w/ IV fluid bolus. NST reassuring, but still not reactive. Dr. Mora Appl notified. BPP ordered->8/8  Assessment: 1. Decreased fetal movement, third trimester, fetus 1   2. Non-reactive NST (non-stress test)   3. Chronic hypertension in pregnancy, third trimester   4. Spotting affecting pregnancy in third trimester, antepartum--postcoital     Plan: Discharge home in stable condition per consult with Dr. Mora Appl. Preterm  labor precautions and fetal kick counts. Pelvic rest 1 week. Preeclampsia precautions.     Follow-up Information    Follow up with [facility], Sanjuana Mae, MD.   Specialty:  Obstetrics and Gynecology   Why:  As scheduled or As needed if symptoms worsen   Contact information:   [address] [place] [address] [phone]        Follow up with [facility] [place] [facility].   Why:  As needed in emergencies   Contact information:   [address] [id number] mc [place] [address] [phone]       Medication List    TAKE these medications        acetaminophen 500 MG tablet  Commonly known as:  TYLENOL  Take 1,000 mg by mouth every 6 (six) hours as needed for mild pain or headache.     albuterol 108 (90 BASE) MCG/ACT inhaler  Commonly known as:  PROVENTIL HFA;VENTOLIN HFA  Inhale 2  puffs into the lungs every 6 (six) hours as needed for wheezing or shortness of breath.     labetalol 200 MG tablet  Commonly known as:  NORMODYNE  Take 400 mg by mouth 2 (two) times daily.     loratadine 10 MG tablet  Commonly known as:  CLARITIN  Take 10 mg by mouth daily as needed for allergies.     prenatal multivitamin Tabs tablet  Take 1 tablet by mouth daily at 12 noon.     ranitidine 150 MG capsule  Commonly known as:  ZANTAC  Take 150 mg by mouth 2 (two) times daily.        [place] Park[place]ia Hutson Luft, CNM 04/12/2014 1:36 PM

## 2014-04-12 NOTE — MAU Note (Signed)
Patient states she has been having lower abdominal pressure for the past weekend. Had a little spotting after intercourse. States she has felt fetal movement but not as much as usual.

## 2014-04-13 LAB — GC/CHLAMYDIA PROBE AMP
CT Probe RNA: NEGATIVE
GC Probe RNA: NEGATIVE

## 2014-04-26 ENCOUNTER — Inpatient Hospital Stay (HOSPITAL_COMMUNITY)
Admission: AD | Admit: 2014-04-26 | Discharge: 2014-04-26 | Disposition: A | Discharge status: Home / Self Care | Payer: 59 | Source: Ambulatory Visit | Attending: Obstetrics and Gynecology | Admitting: Obstetrics and Gynecology

## 2014-04-26 ENCOUNTER — Encounter (HOSPITAL_COMMUNITY): Payer: Self-pay | Admitting: *Deleted

## 2014-04-26 DIAGNOSIS — O133 Gestational [pregnancy-induced] hypertension without significant proteinuria, third trimester: Secondary | ICD-10-CM

## 2014-04-26 DIAGNOSIS — Z3A35 35 weeks gestation of pregnancy: Secondary | ICD-10-CM | POA: Diagnosis not present

## 2014-04-26 DIAGNOSIS — Z87891 Personal history of nicotine dependence: Secondary | ICD-10-CM | POA: Diagnosis not present

## 2014-04-26 DIAGNOSIS — O289 Unspecified abnormal findings on antenatal screening of mother: Secondary | ICD-10-CM

## 2014-04-26 DIAGNOSIS — O10013 Pre-existing essential hypertension complicating pregnancy, third trimester: Secondary | ICD-10-CM | POA: Diagnosis present

## 2014-04-26 DIAGNOSIS — O288 Other abnormal findings on antenatal screening of mother: Secondary | ICD-10-CM

## 2014-04-26 NOTE — MAU Note (Signed)
Pt. Here due to non reactive NST in office today. Was sent by OBgyn. Being seen biweekly for nst's due to elevated BP. Next appointment is scheduled for Friday. Pt. States baby has periods of decreased movement and feels that is not abnormal. Pt. Just ate before arriving and was given ice water at this time. Denies contractions but notes lower abdominal cramping. Denies bleeding or any abnormal discharge.

## 2014-04-26 NOTE — MAU Note (Signed)
Pt. Updated with Dr. Teodoro SprayEvaluation. Ok with discharge at this time. Monitor removed. Awaiting discharge instructions. Pt. oob to restroom at this time.

## 2014-04-26 NOTE — MAU Provider Note (Signed)
Chief Complaint:  No chief complaint on file.  First Provider Initiated Contact with Patient 04/26/14 1949     HPI: Kelli Wells is a 28 y.o. G2P1001 at 7071w4d who presents to maternity admissions after Non-reactive NST in the office and BPP 6/8, -2 for breathing mvmt. [facility] Mild cramping. Some periods of decreased FM, but no change from usual pattern. Pos FM now. No Pre-E Sx.   Past Medical History: Past Medical History  Diagnosis Date  . Hypertension   . Pregnancy induced hypertension   . Vaginal Pap smear, abnormal     LEEP, normal since  . Infection     UTI  . Anxiety   . Depression   . Asthma     Past obstetric history: OB History  Gravida Para Term Preterm AB SAB TAB Ectopic Multiple Living  2 1 1       1     # Outcome Date GA Lbr Len/2nd Weight Sex Delivery Anes PTL Lv  2 Current           1 Term 10/13/04    F Vag-Spont EPI N Y     Comments: PIH induction      Past Surgical History: Past Surgical History  Procedure Laterality Date  . Lymph gland excision    . Leep       Family History: Family History  Problem Relation Age of Onset  . Hypertension Mother   . Cancer Mother     rectal  . Hypertension Father   . Hypertension Maternal Grandmother   . Cancer Maternal Grandmother     breast  . Hypertension Maternal Grandfather   . Kidney disease Paternal Grandmother   . Heart disease Paternal Grandfather   . Hearing loss Neg Hx     Social History: History  Substance Use Topics  . Smoking status: Former Games developermoker  . Smokeless tobacco: Never Used     Comment: quit June 2015  . Alcohol Use: No    Allergies:  Allergies  Allergen Reactions  . Sulfa Antibiotics Swelling  . Sulfate Swelling    Meds:  Prescriptions prior to admission  Medication Sig Dispense Refill Last Dose  . acetaminophen (TYLENOL) 500 MG tablet Take 1,000 mg by mouth every 6 (six) hours as needed for headache.    Past Week at Unknown time  .  labetalol (NORMODYNE) 200 MG tablet Take 400 mg by mouth 2 (two) times daily.   04/26/2014 at 1830  . loratadine (CLARITIN) 10 MG tablet Take 10 mg by mouth daily as needed for allergies.   Past Month at Unknown time  . Prenatal Vit-Fe Fumarate-FA (PRENATAL MULTIVITAMIN) TABS tablet Take 1 tablet by mouth daily at 12 noon.   04/26/2014 at Unknown time  . ranitidine (ZANTAC) 150 MG capsule Take 150 mg by mouth daily as needed for heartburn.    04/25/2014 at Unknown time  . albuterol (PROVENTIL HFA;VENTOLIN HFA) 108 (90 BASE) MCG/ACT inhaler Inhale 2 puffs into the lungs every 6 (six) hours as needed for wheezing or shortness of breath.   rescue at Unknown time    ROS: Pertinent findings in history of present illness.  Physical Exam  Blood pressure 136/71, pulse 96, temperature 98.1 F (36.7 C), temperature source Oral, resp. rate 18, last menstrual period 08/20/2013. GENERAL: Well-developed, well-nourished female in no acute distress.  HEENT: normocephalic HEART: normal rate RESP: normal effort ABDOMEN: Soft, non-tender, gravid appropriate for gestational age EXTREMITIES: Nontender, no edema NEURO: alert  and oriented Dilation: Closed Cervical Position: Posterior Exam by:: Dorathy KinsmanVirginia Rook Maue, CNM Dilation: Closed Cervical Position: Posterior Exam by:: Dorathy KinsmanVirginia Hildred Pharo, CNM  FHT:  Baseline 140 , moderate variability, borderline 15x15 accelerations present, one 20 sec variable and one 50 sec  decelerations Contractions: none   Labs: No results found for this or any previous visit (from the past 24 hour(s)).  Imaging:    MAU Course: Discussed NST w/ two variables w/ Dr, Dareen PianoAnderson. D/C home. NST tomorrow at office.   Assessment: 1. Non-stress test nonreactive   2. Gestational hypertension, third trimester     Plan: Discharge home per consult w/ Dr. Dareen PianoAnderson.  Preterm labor precautions and fetal kick counts. Pre-E precautions     Follow-up Information    Follow up with  Levi AlandANDERSON,MARK E, MD On 04/27/2014.   Specialty:  Obstetrics and Gynecology   Why:  for NST   Contact information:   [address] [place] [id number] [phone]       [facility] with [facility].   Why:  As needed in emergencies   Contact information:   [address] [id number] [place] [id number] [phone]       Medication List    TAKE these medications        acetaminophen 500 MG tablet  Commonly known as:  TYLENOL  Take 1,000 mg by mouth every 6 (six) hours as needed for headache.     albuterol 108 (90 BASE) MCG/ACT inhaler  Commonly known as:  PROVENTIL HFA;VENTOLIN HFA  Inhale 2 puffs into the lungs every 6 (six) hours as needed for wheezing or shortness of breath.     labetalol 200 MG tablet  Commonly known as:  NORMODYNE  Take 400 mg by mouth 2 (two) times daily.     loratadine 10 MG tablet  Commonly known as:  CLARITIN  Take 10 mg by mouth daily as needed for allergies.     prenatal multivitamin Tabs tablet  Take 1 tablet by mouth daily at 12 noon.     ranitidine 150 MG capsule  Commonly known as:  ZANTAC  Take 150 mg by mouth daily as needed for heartburn.       [place] Roverto Bodmer, [place] 04/26/2014 8:35 PM

## 2014-04-26 NOTE — MAU Note (Signed)
Pt. On left side.

## 2014-04-26 NOTE — Discharge Instructions (Signed)
Nonstress Test The nonstress test is a procedure that monitors the fetus's heartbeat. The test will monitor the heartbeat when the fetus is at rest and while the fetus is moving. In a healthy fetus, there will be an increase in fetal heart rate when the fetus moves or kicks. The heart rate will decrease at rest. This test helps determine if the fetus is healthy. Your health care provider will look at a number of patterns in the heart rate tracing to make sure your baby is thriving. If there is concern, your health care provider may order additional tests or may suggest another course of action. This test is often done in the third trimester and can help determine if an early delivery is needed and safe. Common reasons to have this test are:  You are past your due date.  You have a high-risk pregnancy.  You are feeling less movement than normal.  You have lost a pregnancy in the past.  Your health care provider suspects fetal growth problems.  You have too much or too little amniotic fluid. BEFORE THE PROCEDURE  Eat a meal right before the test or as directed by your health care provider. Food may help stimulate fetal movements.  Use the restroom right before the test. PROCEDURE  Two belts will be placed around your abdomen. These belts have monitors attached to them. One records the fetal heart rate and the other records uterine contractions.  You may be asked to lie down on your side or to stay sitting upright.  You may be given a button to press when you feel movement.  The fetal heartbeat is listened to and watched on a screen. The heartbeat is recorded on a sheet of paper.  If the fetus seems to be sleeping, you may be asked to drink some juice or soda, gently press your abdomen, or make some noise to wake the fetus. AFTER THE PROCEDURE  Your health care provider will discuss the test results with you and make recommendations for the near future. Document Released: 04/19/2002  Document Revised: 09/13/2013 Document Reviewed: 06/02/2012 [facility] Patient Information 2015 [place], [place]. This information is not intended to replace advice given to you by your health care provider. Make sure you discuss any questions you have with your health care provider.   Hypertension During Pregnancy Hypertension, or high blood pressure, is when there is extra pressure inside your blood vessels that carry blood from the heart to the rest of your body (arteries). It can happen at any time in life, including pregnancy. Hypertension during pregnancy can cause problems for you and your baby. Your baby might not weigh as much as he or she should at birth or might be born early (premature). Very bad cases of hypertension during pregnancy can be life-threatening.  Different types of hypertension can occur during pregnancy. These include:  Chronic hypertension. This happens when a woman has hypertension before pregnancy and it continues during pregnancy.  Gestational hypertension. This is when hypertension develops during pregnancy.  Preeclampsia or toxemia of pregnancy. This is a very serious type of hypertension that develops only during pregnancy. It affects the whole body and can be very dangerous for both mother and baby.  Gestational hypertension and preeclampsia usually go away after your baby is born. Your blood pressure will likely stabilize within 6 weeks. Women who have hypertension during pregnancy have a greater chance of developing hypertension later in life or with future pregnancies. RISK FACTORS There are certain factors that make  it more likely for you to develop hypertension during pregnancy. These include:  Having hypertension before pregnancy.  Having hypertension during a previous pregnancy.  Being overweight.  Being older than 40 years.  Being pregnant with more than one baby.  Having diabetes or kidney problems. SIGNS AND SYMPTOMS Chronic and gestational  hypertension rarely cause symptoms. Preeclampsia has symptoms, which may include:  Increased protein in your urine. Your health care provider will check for this at every prenatal visit.  Swelling of your hands and face.  Rapid weight gain.  Headaches.  Visual changes.  Being bothered by light.  Abdominal pain, especially in the upper right area.  Chest pain.  Shortness of breath.  Increased reflexes.  Seizures. These occur with a more severe form of preeclampsia, called eclampsia. DIAGNOSIS  You may be diagnosed with hypertension during a regular prenatal exam. At each prenatal visit, you may have:  Your blood pressure checked.  A urine test to check for protein in your urine. The type of hypertension you are diagnosed with depends on when you developed it. It also depends on your specific blood pressure reading.  Developing hypertension before 20 weeks of pregnancy is consistent with chronic hypertension.  Developing hypertension after 20 weeks of pregnancy is consistent with gestational hypertension.  Hypertension with increased urinary protein is diagnosed as preeclampsia.  Blood pressure measurements that stay above 160 systolic or 110 diastolic are a sign of severe preeclampsia. TREATMENT Treatment for hypertension during pregnancy varies. Treatment depends on the type of hypertension and how serious it is.  If you take medicine for chronic hypertension, you may need to switch medicines.  Medicines called ACE inhibitors should not be taken during pregnancy.  Low-dose aspirin may be suggested for women who have risk factors for preeclampsia.  If you have gestational hypertension, you may need to take a blood pressure medicine that is safe during pregnancy. Your health care provider will recommend the correct medicine.  If you have severe preeclampsia, you may need to be in the hospital. Health care providers will watch you and your baby very closely. You also may  need to take medicine called magnesium sulfate to prevent seizures and lower blood pressure.  Sometimes, an early delivery is needed. This may be the case if the condition worsens. It would be done to protect you and your baby. The only cure for preeclampsia is delivery.  Your health care provider may recommend that you take one low-dose aspirin (81 mg) each day to help prevent high blood pressure during your pregnancy if you are at risk for preeclampsia. You may be at risk for preeclampsia if:  You had preeclampsia or eclampsia during a previous pregnancy.  Your baby did not grow as expected during a previous pregnancy.  You experienced preterm birth with a previous pregnancy.  You experienced a separation of the placenta from the uterus (placental abruption) during a previous pregnancy.  You experienced the loss of your baby during a previous pregnancy.  You are pregnant with more than one baby.  You have other medical conditions, such as diabetes or an autoimmune disease. HOME CARE INSTRUCTIONS  Schedule and keep all of your regular prenatal care appointments. This is important.  Take medicines only as directed by your health care provider. Tell your health care provider about all medicines you take.  Eat as little salt as possible.  Get regular exercise.  Do not drink alcohol.  Do not use tobacco products.  Do not drink products with  caffeine.  Lie on your left side when resting. SEEK IMMEDIATE MEDICAL CARE IF:  You have severe abdominal pain.  You have sudden swelling in your hands, ankles, or face.  You gain 4 pounds (1.8 kg) or more in 1 week.  You vomit repeatedly.  You have vaginal bleeding.  You do not feel your baby moving as much.  You have a headache.  You have blurred or double vision.  You have muscle twitching or spasms.  You have shortness of breath.  You have blue fingernails or lips.  You have blood in your urine. MAKE SURE  YOU:  Understand these instructions.  Will watch your condition.  Will get help right away if you are not doing well or get worse. Document Released: 01/15/2011 Document Revised: 09/13/2013 Document Reviewed: 11/26/2012 [facility] Patient Information 2015 [place], [place]. This information is not intended to replace advice given to you by your health care provider. Make sure you discuss any questions you have with your health care provider.

## 2014-04-28 ENCOUNTER — Inpatient Hospital Stay (HOSPITAL_COMMUNITY)
Admission: AD | Admit: 2014-04-28 | Discharge: 2014-04-29 | Disposition: A | Discharge status: Home / Self Care | Payer: 59 | Source: Ambulatory Visit | Attending: Obstetrics and Gynecology | Admitting: Obstetrics and Gynecology

## 2014-04-28 DIAGNOSIS — O9989 Other specified diseases and conditions complicating pregnancy, childbirth and the puerperium: Secondary | ICD-10-CM | POA: Insufficient documentation

## 2014-04-28 DIAGNOSIS — Z3A36 36 weeks gestation of pregnancy: Secondary | ICD-10-CM | POA: Insufficient documentation

## 2014-04-28 DIAGNOSIS — O26893 Other specified pregnancy related conditions, third trimester: Secondary | ICD-10-CM

## 2014-04-28 DIAGNOSIS — Z87891 Personal history of nicotine dependence: Secondary | ICD-10-CM | POA: Insufficient documentation

## 2014-04-28 DIAGNOSIS — R12 Heartburn: Secondary | ICD-10-CM | POA: Insufficient documentation

## 2014-04-29 ENCOUNTER — Encounter (HOSPITAL_COMMUNITY): Payer: Self-pay | Admitting: *Deleted

## 2014-04-29 DIAGNOSIS — R109 Unspecified abdominal pain: Secondary | ICD-10-CM | POA: Diagnosis present

## 2014-04-29 DIAGNOSIS — O9989 Other specified diseases and conditions complicating pregnancy, childbirth and the puerperium: Secondary | ICD-10-CM | POA: Diagnosis not present

## 2014-04-29 DIAGNOSIS — Z3A36 36 weeks gestation of pregnancy: Secondary | ICD-10-CM | POA: Diagnosis not present

## 2014-04-29 DIAGNOSIS — R12 Heartburn: Secondary | ICD-10-CM | POA: Diagnosis not present

## 2014-04-29 DIAGNOSIS — Z87891 Personal history of nicotine dependence: Secondary | ICD-10-CM | POA: Diagnosis not present

## 2014-04-29 MED ORDER — GI COCKTAIL ~~LOC~~
30.0000 mL | Freq: Once | ORAL | Status: AC
  Administered 2014-04-29: 30 mL via ORAL
  Filled 2014-04-29: qty 30

## 2014-04-29 NOTE — MAU Note (Signed)
PT  SAYS SHE HAS UPPER ABD  PAIN   THAT  STARTED  AT 2300-   FEELS   CONSTANT -  PAIN-   BUT  ALSO  HAS BURNING.   [facility]  [address] [address] -   MAU -  SOFT-  CLOSED.     DENIES   HSV AND MRSA.    NO  VOMITING

## 2014-04-29 NOTE — Discharge Instructions (Signed)
Third Trimester of Pregnancy °The third trimester is from week 29 through week 42, months 7 through 9. The third trimester is a time when the fetus is growing rapidly. At the end of the ninth month, the fetus is about 20 inches in length and weighs 6-10 pounds.  °BODY CHANGES °Your body goes through many changes during pregnancy. The changes vary from woman to woman.  °· Your weight will continue to increase. You can expect to gain 25-35 pounds (11-16 kg) by the end of the pregnancy. °· You may begin to get stretch marks on your hips, abdomen, and breasts. °· You may urinate more often because the fetus is moving lower into your pelvis and pressing on your bladder. °· You may develop or continue to have heartburn as a result of your pregnancy. °· You may develop constipation because certain hormones are causing the muscles that push waste through your intestines to slow down. °· You may develop hemorrhoids or swollen, bulging veins (varicose veins). °· You may have pelvic pain because of the weight gain and pregnancy hormones relaxing your joints between the bones in your pelvis. Backaches may result from overexertion of the muscles supporting your posture. °· You may have changes in your hair. These can include thickening of your hair, rapid growth, and changes in texture. Some women also have hair loss during or after pregnancy, or hair that feels dry or thin. Your hair will most likely return to normal after your baby is born. °· Your breasts will continue to grow and be tender. A yellow discharge may leak from your breasts called colostrum. °· Your belly button may stick out. °· You may feel short of breath because of your expanding uterus. °· You may notice the fetus "dropping," or moving lower in your abdomen. °· You may have a bloody mucus discharge. This usually occurs a few days to a week before labor begins. °· Your cervix becomes thin and soft (effaced) near your due date. °WHAT TO EXPECT AT YOUR PRENATAL  EXAMS  °You will have prenatal exams every 2 weeks until week 36. Then, you will have weekly prenatal exams. During a routine prenatal visit: °· You will be weighed to make sure you and the fetus are growing normally. °· Your blood pressure is taken. °· Your abdomen will be measured to track your baby's growth. °· The fetal heartbeat will be listened to. °· Any test results from the previous visit will be discussed. °· You may have a cervical check near your due date to see if you have effaced. °At around 36 weeks, your caregiver will check your cervix. At the same time, your caregiver will also perform a test on the secretions of the vaginal tissue. This test is to determine if a type of bacteria, Group B streptococcus, is present. Your caregiver will explain this further. °Your caregiver may ask you: °· What your birth plan is. °· How you are feeling. °· If you are feeling the baby move. °· If you have had any abnormal symptoms, such as leaking fluid, bleeding, severe headaches, or abdominal cramping. °· If you have any questions. °Other tests or screenings that may be performed during your third trimester include: °· Blood tests that check for low iron levels (anemia). °· Fetal testing to check the health, activity level, and growth of the fetus. Testing is done if you have certain medical conditions or if there are problems during the pregnancy. °FALSE LABOR °You may feel small, irregular contractions that   eventually go away. These are called Braxton Hicks contractions, or false labor. Contractions may last for hours, days, or even weeks before true labor sets in. If contractions come at regular intervals, intensify, or become painful, it is best to be seen by your caregiver.  °SIGNS OF LABOR  °· Menstrual-like cramps. °· Contractions that are 5 minutes apart or less. °· Contractions that start on the top of the uterus and spread down to the lower abdomen and back. °· A sense of increased pelvic pressure or back  pain. °· A watery or bloody mucus discharge that comes from the vagina. °If you have any of these signs before the 37th week of pregnancy, call your caregiver right away. You need to go to the hospital to get checked immediately. °HOME CARE INSTRUCTIONS  °· Avoid all smoking, herbs, alcohol, and unprescribed drugs. These chemicals affect the formation and growth of the baby. °· Follow your caregiver's instructions regarding medicine use. There are medicines that are either safe or unsafe to take during pregnancy. °· Exercise only as directed by your caregiver. Experiencing uterine cramps is a good sign to stop exercising. °· Continue to eat regular, healthy meals. °· Wear a good support bra for breast tenderness. °· Do not use hot tubs, steam rooms, or saunas. °· Wear your seat belt at all times when driving. °· Avoid raw meat, uncooked cheese, cat litter boxes, and soil used by cats. These carry germs that can cause birth defects in the baby. °· Take your prenatal vitamins. °· Try taking a stool softener (if your caregiver approves) if you develop constipation. Eat more high-fiber foods, such as fresh vegetables or fruit and whole grains. Drink plenty of fluids to keep your urine clear or pale yellow. °· Take warm sitz baths to soothe any pain or discomfort caused by hemorrhoids. Use hemorrhoid cream if your caregiver approves. °· If you develop varicose veins, wear support hose. Elevate your feet for 15 minutes, 3-4 times a day. Limit salt in your diet. °· Avoid heavy lifting, wear low heal shoes, and practice good posture. °· Rest a lot with your legs elevated if you have leg cramps or low back pain. °· Visit your dentist if you have not gone during your pregnancy. Use a soft toothbrush to brush your teeth and be gentle when you floss. °· A sexual relationship may be continued unless your caregiver directs you otherwise. °· Do not travel far distances unless it is absolutely necessary and only with the approval  of your caregiver. °· Take prenatal classes to understand, practice, and ask questions about the labor and delivery. °· Make a trial run to the hospital. °· Pack your hospital bag. °· Prepare the baby's nursery. °· Continue to go to all your prenatal visits as directed by your caregiver. °SEEK MEDICAL CARE IF: °· You are unsure if you are in labor or if your water has broken. °· You have dizziness. °· You have mild pelvic cramps, pelvic pressure, or nagging pain in your abdominal area. °· You have persistent nausea, vomiting, or diarrhea. °· You have a bad smelling vaginal discharge. °· You have pain with urination. °SEEK IMMEDIATE MEDICAL CARE IF:  °· You have a fever. °· You are leaking fluid from your vagina. °· You have spotting or bleeding from your vagina. °· You have severe abdominal cramping or pain. °· You have rapid weight loss or gain. °· You have shortness of breath with chest pain. °· You notice sudden or extreme swelling   of your face, hands, ankles, feet, or legs.  You have not felt your baby move in over an hour.  You have severe headaches that do not go away with medicine.  You have vision changes. Document Released: 04/23/2001 Document Revised: 05/04/2013 Document Reviewed: 06/30/2012 [facility] Patient Information 2015 [place], [place]. This information is not intended to replace advice given to you by your health care provider. Make sure you discuss any questions you have with your health care provider. Food Choices to Help Relieve Diarrhea When you have diarrhea, the foods you eat and your eating habits are very important. Choosing the right foods and drinks can help relieve diarrhea. Also, because diarrhea can last up to 7 days, you need to replace lost fluids and electrolytes (such as sodium, potassium, and chloride) in order to help prevent dehydration.  WHAT GENERAL GUIDELINES DO I NEED TO FOLLOW?  Slowly drink 1 cup (8 oz) of fluid for each episode of diarrhea. If you are getting  enough fluid, your urine will be clear or pale yellow.  Eat starchy foods. Some good choices include white rice, white toast, pasta, low-fiber cereal, baked potatoes (without the skin), saltine crackers, and bagels.  Avoid large servings of any cooked vegetables.  Limit fruit to two servings per day. A serving is  cup or 1 small piece.  Choose foods with less than 2 g of fiber per serving.  Limit fats to less than 8 tsp (38 g) per day.  Avoid fried foods.  Eat foods that have probiotics in them. Probiotics can be found in certain dairy products.  Avoid foods and beverages that may increase the speed at which food moves through the stomach and intestines (gastrointestinal tract). Things to avoid include:  High-fiber foods, such as dried fruit, raw fruits and vegetables, nuts, seeds, and whole grain foods.  Spicy foods and high-fat foods.  Foods and beverages sweetened with high-fructose corn syrup, honey, or sugar alcohols such as xylitol, sorbitol, and mannitol. WHAT FOODS ARE RECOMMENDED? Grains White rice. White, [place], or pita breads (fresh or toasted), including plain rolls, buns, or bagels. White pasta. Saltine, soda, or graham crackers. Pretzels. Low-fiber cereal. Cooked cereals made with water (such as cornmeal, farina, or cream cereals). Plain muffins. Matzo. Melba toast. Zwieback.  Vegetables Potatoes (without the skin). Strained tomato and vegetable juices. Most well-cooked and canned vegetables without seeds. Tender lettuce. Fruits Cooked or canned applesauce, apricots, cherries, fruit cocktail, grapefruit, peaches, pears, or plums. Fresh bananas, apples without skin, cherries, grapes, cantaloupe, grapefruit, peaches, oranges, or plums.  Meat and Other Protein Products Baked or boiled chicken. Eggs. Tofu. Fish. Seafood. Smooth peanut butter. Ground or well-cooked tender beef, ham, veal, lamb, pork, or poultry.  Dairy Plain yogurt, kefir, and unsweetened liquid yogurt.  Lactose-free milk, buttermilk, or soy milk. Plain hard cheese. Beverages Sport drinks. Clear broths. Diluted fruit juices (except prune). Regular, caffeine-free sodas such as ginger ale. Water. Decaffeinated teas. Oral rehydration solutions. Sugar-free beverages not sweetened with sugar alcohols. Other Bouillon, broth, or soups made from recommended foods.  The items listed above may not be a complete list of recommended foods or beverages. Contact your dietitian for more options. WHAT FOODS ARE NOT RECOMMENDED? Grains Whole grain, whole wheat, bran, or rye breads, rolls, pastas, crackers, and cereals. Wild or brown rice. Cereals that contain more than 2 g of fiber per serving. Corn tortillas or taco shells. Cooked or dry oatmeal. Granola. Popcorn. Vegetables Raw vegetables. Cabbage, broccoli, Brussels sprouts, artichokes, baked beans, beet greens,  corn, kale, legumes, peas, sweet potatoes, and yams. Potato skins. Cooked spinach and cabbage. Fruits Dried fruit, including raisins and dates. Raw fruits. Stewed or dried prunes. Fresh apples with skin, apricots, mangoes, pears, raspberries, and strawberries.  Meat and Other Protein Products Chunky peanut butter. Nuts and seeds. Beans and lentils. Tomasa BlaseBacon.  Dairy High-fat cheeses. Milk, chocolate milk, and beverages made with milk, such as milk shakes. Cream. Ice cream. Sweets and Desserts Sweet rolls, doughnuts, and sweet breads. Pancakes and waffles. Fats and Oils Butter. Cream sauces. Margarine. Salad oils. Plain salad dressings. Olives. Avocados.  Beverages Caffeinated beverages (such as coffee, tea, soda, or energy drinks). Alcoholic beverages. Fruit juices with pulp. Prune juice. Soft drinks sweetened with high-fructose corn syrup or sugar alcohols. Other Coconut. Hot sauce. Chili powder. Mayonnaise. Gravy. Cream-based or milk-based soups.  The items listed above may not be a complete list of foods and beverages to avoid. Contact your  dietitian for more information. WHAT SHOULD I DO IF I BECOME DEHYDRATED? Diarrhea can sometimes lead to dehydration. Signs of dehydration include dark urine and dry mouth and skin. If you think you are dehydrated, you should rehydrate with an oral rehydration solution. These solutions can be purchased at pharmacies, retail stores, or online.  Drink -1 cup (120-240 mL) of oral rehydration solution each time you have an episode of diarrhea. If drinking this amount makes your diarrhea worse, try drinking smaller amounts more often. For example, drink 1-3 tsp (5-15 mL) every 5-10 minutes.  A general rule for staying hydrated is to drink 1-2 L of fluid per day. Talk to your health care provider about the specific amount you should be drinking each day. Drink enough fluids to keep your urine clear or pale yellow. Document Released: 07/20/2003 Document Revised: 05/04/2013 Document Reviewed: 03/22/2013 [facility] Patient Information 2015 [place], [place]. This information is not intended to replace advice given to you by your health care provider. Make sure you discuss any questions you have with your health care provider.

## 2014-04-29 NOTE — MAU Provider Note (Signed)
History     CSN: [id number]  Arrival date and time: 04/28/14 2353   First Provider Initiated Contact with Patient 04/29/14 [phone]      Chief Complaint  Patient presents with  . Abdominal Pain   HPI Comments: Kelli Wells 28 y.o. G2P1001 [id number] presents to MAU with upper abdominal pains that woke her out of sleep at 11 pm. She had cheese steak for dinner tonight but has felt crampy for about 3 days. She takes zantac. She has an appointment with OB tomorrow.  Abdominal Pain      Past Medical History  Diagnosis Date  . Hypertension   . Pregnancy induced hypertension   . Vaginal Pap smear, abnormal     LEEP, normal since  . Infection     UTI  . Anxiety   . Depression   . Asthma     Past Surgical History  Procedure Laterality Date  . Lymph gland excision    . Leep      Family History  Problem Relation Age of Onset  . Hypertension Mother   . Cancer Mother     rectal  . Hypertension Father   . Hypertension Maternal Grandmother   . Cancer Maternal Grandmother     breast  . Hypertension Maternal Grandfather   . Kidney disease Paternal Grandmother   . Heart disease Paternal Grandfather   . Hearing loss Neg Hx     History  Substance Use Topics  . Smoking status: Former Games developermoker  . Smokeless tobacco: Never Used     Comment: quit June 2015  . Alcohol Use: No    Allergies:  Allergies  Allergen Reactions  . Sulfa Antibiotics Swelling  . Sulfate Swelling    Prescriptions prior to admission  Medication Sig Dispense Refill Last Dose  . acetaminophen (TYLENOL) 500 MG tablet Take 1,000 mg by mouth every 6 (six) hours as needed for headache.    Past Week at Unknown time  . albuterol (PROVENTIL HFA;VENTOLIN HFA) 108 (90 BASE) MCG/ACT inhaler Inhale 2 puffs into the lungs every 6 (six) hours as needed for wheezing or shortness of breath.   rescue at Unknown time  . labetalol (NORMODYNE) 200 MG tablet Take 400 mg by mouth 2 (two) times daily.   04/26/2014 at 1830  .  loratadine (CLARITIN) 10 MG tablet Take 10 mg by mouth daily as needed for allergies.   Past Month at Unknown time  . Prenatal Vit-Fe Fumarate-FA (PRENATAL MULTIVITAMIN) TABS tablet Take 1 tablet by mouth daily at 12 noon.   04/26/2014 at Unknown time  . ranitidine (ZANTAC) 150 MG capsule Take 150 mg by mouth daily as needed for heartburn.    04/25/2014 at Unknown time    Review of Systems  Constitutional: Negative.   HENT: Negative.   Eyes: Negative.   Respiratory: Negative.   Cardiovascular: Negative.   Gastrointestinal: Positive for abdominal pain.  Genitourinary: Negative.   Musculoskeletal: Negative.   Skin: Negative.   Neurological: Negative.   Psychiatric/Behavioral: Negative.    Physical Exam   Blood pressure 137/72, pulse 87, temperature 97.8 F (36.6 C), temperature source Oral, resp. rate 20, height 5\' 2"  (1.575 m), weight 93.214 kg (205 lb 8 oz), last menstrual period 08/20/2013.  Physical Exam  Constitutional: She is oriented to person, place, and time. She appears well-developed and well-nourished.  HENT:  Head: Normocephalic and atraumatic.  Eyes: Pupils are equal, round, and reactive to light.  Cardiovascular: Normal rate, regular rhythm and normal heart sounds.  Respiratory: Effort normal and breath sounds normal. No respiratory distress. She has no wheezes. She has no rales.  GI: Soft. Bowel sounds are normal. She exhibits no distension. There is no tenderness. There is no rebound and no guarding.  Musculoskeletal: Normal range of motion.  Neurological: She is alert and oriented to person, place, and time.  Skin: Skin is warm and dry.  Psychiatric: She has a normal mood and affect. Her behavior is normal. Judgment and thought content normal.   TOCO: rate 130, no contractions, reactive MAU Course  Procedures  MDM  GI Cocktail resolved pains  Assessment and Plan   A: Heartburn  P: GI cocktail  Advised at length about small, frequent, bland diet till  delivery Follow up with Dr Tenny Crawoss in morning Return to MAU as needed    Carolynn ServeBarefoot, Minna Dumire Miller 04/29/2014, 12:53 AM

## 2014-04-29 NOTE — MAU Note (Signed)
Pt states she ate a cheese steak sandwich tonight about 2000 or 2100. Pt states she was awakened out of her sleep with abdominal pain. Pt states it has been feeling cramping about 0400.  This has happened for the last 3 nights.

## 2014-05-12 ENCOUNTER — Inpatient Hospital Stay (HOSPITAL_COMMUNITY): Payer: 59 | Admitting: Anesthesiology

## 2014-05-12 ENCOUNTER — Inpatient Hospital Stay (HOSPITAL_COMMUNITY)
Admission: AD | Admit: 2014-05-12 | Discharge: 2014-05-14 | DRG: 774 | Disposition: A | Discharge status: Home / Self Care | Payer: 59 | Source: Ambulatory Visit | Attending: Obstetrics and Gynecology | Admitting: Obstetrics and Gynecology

## 2014-05-12 ENCOUNTER — Encounter (HOSPITAL_COMMUNITY): Payer: Self-pay | Admitting: *Deleted

## 2014-05-12 DIAGNOSIS — Z87891 Personal history of nicotine dependence: Secondary | ICD-10-CM | POA: Diagnosis not present

## 2014-05-12 DIAGNOSIS — Z3A37 37 weeks gestation of pregnancy: Secondary | ICD-10-CM | POA: Diagnosis present

## 2014-05-12 DIAGNOSIS — O99824 Streptococcus B carrier state complicating childbirth: Secondary | ICD-10-CM | POA: Diagnosis present

## 2014-05-12 DIAGNOSIS — O1092 Unspecified pre-existing hypertension complicating childbirth: Secondary | ICD-10-CM | POA: Diagnosis present

## 2014-05-12 HISTORY — DX: Gastro-esophageal reflux disease without esophagitis: K21.9

## 2014-05-12 LAB — CBC
HCT: 39.2 % (ref 36.0–46.0)
HEMATOCRIT: 40.6 % (ref 36.0–46.0)
Hemoglobin: 14.7 g/dL (ref 12.0–15.0)
Hemoglobin: 13.5 g/dL (ref 12.0–15.0)
MCH: 30.7 pg (ref 26.0–34.0)
MCH: 29 pg (ref 26.0–34.0)
MCHC: 36.2 g/dL — ABNORMAL HIGH (ref 30.0–36.0)
MCHC: 34.4 g/dL (ref 30.0–36.0)
MCV: 84.8 fL (ref 78.0–100.0)
MCV: 84.3 fL (ref 78.0–100.0)
PLATELETS: 241 10*3/uL (ref 150–400)
PLATELETS: 238 10*3/uL (ref 150–400)
RBC: 4.79 MIL/uL (ref 3.87–5.11)
RBC: 4.65 MIL/uL (ref 3.87–5.11)
RDW: 14.4 % (ref 11.5–15.5)
RDW: 14.4 % (ref 11.5–15.5)
WBC: 12 10*3/uL — ABNORMAL HIGH (ref 4.0–10.5)
WBC: 11.2 10*3/uL — ABNORMAL HIGH (ref 4.0–10.5)

## 2014-05-12 LAB — TYPE AND SCREEN
ABO/RH(D): O POS
Antibody Screen: NEGATIVE

## 2014-05-12 LAB — HIV ANTIBODY (ROUTINE TESTING W REFLEX): HIV: NONREACTIVE

## 2014-05-12 MED ORDER — DIPHENHYDRAMINE HCL 25 MG PO CAPS: 25.0000 mg | ORAL_CAPSULE | Freq: Four times a day (QID) | ORAL | Status: DC | PRN

## 2014-05-12 MED ORDER — LIDOCAINE HCL (PF) 1 % IJ SOLN
30.0000 mL | INTRAMUSCULAR | Status: DC | PRN
  Filled 2014-05-12: qty 30

## 2014-05-12 MED ORDER — ACETAMINOPHEN 325 MG PO TABS: 650.0000 mg | ORAL_TABLET | ORAL | Status: DC | PRN

## 2014-05-12 MED ORDER — LANOLIN HYDROUS EX OINT: TOPICAL_OINTMENT | CUTANEOUS | Status: DC | PRN

## 2014-05-12 MED ORDER — OXYCODONE-ACETAMINOPHEN 5-325 MG PO TABS
1.0000 | ORAL_TABLET | ORAL | Status: DC | PRN
  Administered 2014-05-12 (×3): 1 via ORAL
  Filled 2014-05-12 (×4): qty 1

## 2014-05-12 MED ORDER — FAMOTIDINE 20 MG PO TABS
10.0000 mg | ORAL_TABLET | Freq: Two times a day (BID) | ORAL | Status: DC
  Administered 2014-05-12 – 2014-05-14 (×4): 10 mg via ORAL
  Filled 2014-05-12 (×4): qty 1

## 2014-05-12 MED ORDER — FLEET ENEMA 7-19 GM/118ML RE ENEM: 1.0000 | ENEMA | RECTAL | Status: DC | PRN

## 2014-05-12 MED ORDER — LIDOCAINE HCL (PF) 1 % IJ SOLN
INTRAMUSCULAR | Status: DC | PRN
  Administered 2014-05-12 (×2): 8 mL

## 2014-05-12 MED ORDER — EPHEDRINE 5 MG/ML INJ
10.0000 mg | INTRAVENOUS | Status: DC | PRN
  Filled 2014-05-12: qty 2

## 2014-05-12 MED ORDER — OXYTOCIN 40 UNITS IN LACTATED RINGERS INFUSION - SIMPLE MED
62.5000 mL/h | INTRAVENOUS | Status: DC
  Administered 2014-05-12: 62.5 mL/h via INTRAVENOUS
  Filled 2014-05-12: qty 1000

## 2014-05-12 MED ORDER — PHENYLEPHRINE 40 MCG/ML (10ML) SYRINGE FOR IV PUSH (FOR BLOOD PRESSURE SUPPORT)
80.0000 ug | PREFILLED_SYRINGE | INTRAVENOUS | Status: DC | PRN
  Filled 2014-05-12: qty 2
  Filled 2014-05-12: qty 10

## 2014-05-12 MED ORDER — LACTATED RINGERS IV SOLN: 500.0000 mL | Freq: Once | INTRAVENOUS | Status: DC

## 2014-05-12 MED ORDER — FERROUS SULFATE 325 (65 FE) MG PO TABS
325.0000 mg | ORAL_TABLET | Freq: Two times a day (BID) | ORAL | Status: DC
  Administered 2014-05-12 – 2014-05-14 (×4): 325 mg via ORAL
  Filled 2014-05-12 (×4): qty 1

## 2014-05-12 MED ORDER — SODIUM CHLORIDE 0.9 % IV SOLN: 250.0000 mL | INTRAVENOUS | Status: DC | PRN

## 2014-05-12 MED ORDER — TETANUS-DIPHTH-ACELL PERTUSSIS 5-2.5-18.5 LF-MCG/0.5 IM SUSP
0.5000 mL | Freq: Once | INTRAMUSCULAR | Status: DC
Start: 2014-05-13 — End: 2014-05-14
  Filled 2014-05-12: qty 0.5

## 2014-05-12 MED ORDER — SENNOSIDES-DOCUSATE SODIUM 8.6-50 MG PO TABS
2.0000 | ORAL_TABLET | ORAL | Status: DC
  Administered 2014-05-13 – 2014-05-14 (×2): 2 via ORAL
  Filled 2014-05-12 (×2): qty 2

## 2014-05-12 MED ORDER — OXYCODONE-ACETAMINOPHEN 5-325 MG PO TABS: 2.0000 | ORAL_TABLET | ORAL | Status: DC | PRN

## 2014-05-12 MED ORDER — PNEUMOCOCCAL VAC POLYVALENT 25 MCG/0.5ML IJ INJ
0.5000 mL | INJECTION | INTRAMUSCULAR | Status: DC
  Filled 2014-05-12: qty 0.5

## 2014-05-12 MED ORDER — OXYTOCIN BOLUS FROM INFUSION
500.0000 mL | INTRAVENOUS | Status: DC
  Administered 2014-05-12: 500 mL via INTRAVENOUS

## 2014-05-12 MED ORDER — OXYCODONE-ACETAMINOPHEN 5-325 MG PO TABS
1.0000 | ORAL_TABLET | ORAL | Status: DC | PRN
Start: 2014-05-12 — End: 2014-05-12

## 2014-05-12 MED ORDER — ONDANSETRON HCL 4 MG/2ML IJ SOLN: 4.0000 mg | Freq: Four times a day (QID) | INTRAMUSCULAR | Status: DC | PRN

## 2014-05-12 MED ORDER — MISOPROSTOL 25 MCG QUARTER TABLET
25.0000 ug | ORAL_TABLET | ORAL | Status: DC | PRN
  Administered 2014-05-12: 25 ug via VAGINAL
  Filled 2014-05-12: qty 1
  Filled 2014-05-12: qty 0.25

## 2014-05-12 MED ORDER — DEXTROSE 5 % IV SOLN
5.0000 10*6.[IU] | Freq: Once | INTRAVENOUS | Status: AC
  Administered 2014-05-12: 5 10*6.[IU] via INTRAVENOUS
  Filled 2014-05-12: qty 5

## 2014-05-12 MED ORDER — METHYLERGONOVINE MALEATE 0.2 MG PO TABS
0.2000 mg | ORAL_TABLET | ORAL | Status: DC | PRN
Start: 2014-05-12 — End: 2014-05-14

## 2014-05-12 MED ORDER — PHENYLEPHRINE 40 MCG/ML (10ML) SYRINGE FOR IV PUSH (FOR BLOOD PRESSURE SUPPORT)
80.0000 ug | PREFILLED_SYRINGE | INTRAVENOUS | Status: DC | PRN
  Filled 2014-05-12: qty 2

## 2014-05-12 MED ORDER — SIMETHICONE 80 MG PO CHEW
80.0000 mg | CHEWABLE_TABLET | ORAL | Status: DC | PRN
  Filled 2014-05-12: qty 1

## 2014-05-12 MED ORDER — CITRIC ACID-SODIUM CITRATE 334-500 MG/5ML PO SOLN: 30.0000 mL | ORAL | Status: DC | PRN

## 2014-05-12 MED ORDER — TERBUTALINE SULFATE 1 MG/ML IJ SOLN: 0.2500 mg | Freq: Once | INTRAMUSCULAR | Status: DC | PRN

## 2014-05-12 MED ORDER — SODIUM CHLORIDE 0.9 % IJ SOLN: 3.0000 mL | Freq: Two times a day (BID) | INTRAMUSCULAR | Status: DC

## 2014-05-12 MED ORDER — LACTATED RINGERS IV SOLN: 500.0000 mL | INTRAVENOUS | Status: DC | PRN

## 2014-05-12 MED ORDER — MAGNESIUM HYDROXIDE 400 MG/5ML PO SUSP: 30.0000 mL | ORAL | Status: DC | PRN

## 2014-05-12 MED ORDER — FENTANYL 2.5 MCG/ML BUPIVACAINE 1/10 % EPIDURAL INFUSION (WH - ANES)
INTRAMUSCULAR | Status: DC | PRN
  Administered 2014-05-12: 14 mL/h via EPIDURAL

## 2014-05-12 MED ORDER — BENZOCAINE-MENTHOL 20-0.5 % EX AERO
1.0000 "application " | INHALATION_SPRAY | CUTANEOUS | Status: DC | PRN
  Administered 2014-05-14: 1 via TOPICAL
  Filled 2014-05-12: qty 56

## 2014-05-12 MED ORDER — ALBUTEROL SULFATE (2.5 MG/3ML) 0.083% IN NEBU: INHALATION_SOLUTION | Freq: Four times a day (QID) | RESPIRATORY_TRACT | Status: DC | PRN

## 2014-05-12 MED ORDER — ZOLPIDEM TARTRATE 5 MG PO TABS: 5.0000 mg | ORAL_TABLET | Freq: Every evening | ORAL | Status: DC | PRN

## 2014-05-12 MED ORDER — FENTANYL 2.5 MCG/ML BUPIVACAINE 1/10 % EPIDURAL INFUSION (WH - ANES)
14.0000 mL/h | INTRAMUSCULAR | Status: DC | PRN
  Filled 2014-05-12: qty 125

## 2014-05-12 MED ORDER — IBUPROFEN 800 MG PO TABS
800.0000 mg | ORAL_TABLET | Freq: Three times a day (TID) | ORAL | Status: DC
  Administered 2014-05-12 – 2014-05-14 (×5): 800 mg via ORAL
  Filled 2014-05-12 (×5): qty 1

## 2014-05-12 MED ORDER — ONDANSETRON HCL 4 MG/2ML IJ SOLN: 4.0000 mg | INTRAMUSCULAR | Status: DC | PRN

## 2014-05-12 MED ORDER — OXYCODONE-ACETAMINOPHEN 5-325 MG PO TABS: 1.0000 | ORAL_TABLET | ORAL | Status: DC | PRN

## 2014-05-12 MED ORDER — SODIUM CHLORIDE 0.9 % IJ SOLN: 3.0000 mL | INTRAMUSCULAR | Status: DC | PRN

## 2014-05-12 MED ORDER — DIPHENHYDRAMINE HCL 50 MG/ML IJ SOLN: 12.5000 mg | INTRAMUSCULAR | Status: DC | PRN

## 2014-05-12 MED ORDER — LACTATED RINGERS IV SOLN: INTRAVENOUS | Status: DC

## 2014-05-12 MED ORDER — FENTANYL 2.5 MCG/ML BUPIVACAINE 1/10 % EPIDURAL INFUSION (WH - ANES): INTRAMUSCULAR | Status: DC | PRN

## 2014-05-12 MED ORDER — OXYCODONE-ACETAMINOPHEN 5-325 MG PO TABS
2.0000 | ORAL_TABLET | ORAL | Status: DC | PRN
  Administered 2014-05-13 – 2014-05-14 (×4): 2 via ORAL
  Administered 2014-05-14: 1 via ORAL
  Filled 2014-05-12 (×4): qty 2

## 2014-05-12 MED ORDER — LABETALOL HCL 200 MG PO TABS
400.0000 mg | ORAL_TABLET | Freq: Two times a day (BID) | ORAL | Status: DC
  Administered 2014-05-12 – 2014-05-14 (×5): 400 mg via ORAL
  Filled 2014-05-12 (×6): qty 2

## 2014-05-12 MED ORDER — PENICILLIN G POTASSIUM 5000000 UNITS IJ SOLR
2.5000 10*6.[IU] | INTRAVENOUS | Status: DC
  Filled 2014-05-12 (×5): qty 2.5

## 2014-05-12 MED ORDER — WITCH HAZEL-GLYCERIN EX PADS
1.0000 "application " | MEDICATED_PAD | CUTANEOUS | Status: DC | PRN
  Administered 2014-05-14: 1 via TOPICAL

## 2014-05-12 MED ORDER — ONDANSETRON HCL 4 MG PO TABS: 4.0000 mg | ORAL_TABLET | ORAL | Status: DC | PRN

## 2014-05-12 MED ORDER — MEASLES, MUMPS & RUBELLA VAC ~~LOC~~ INJ
0.5000 mL | INJECTION | Freq: Once | SUBCUTANEOUS | Status: DC
  Filled 2014-05-12: qty 0.5

## 2014-05-12 MED ORDER — IBUPROFEN 600 MG PO TABS
600.0000 mg | ORAL_TABLET | Freq: Four times a day (QID) | ORAL | Status: DC
Start: 2014-05-12 — End: 2014-05-12
  Administered 2014-05-12: 600 mg via ORAL
  Filled 2014-05-12: qty 1

## 2014-05-12 MED ORDER — METHYLERGONOVINE MALEATE 0.2 MG/ML IJ SOLN
0.2000 mg | INTRAMUSCULAR | Status: DC | PRN
Start: 2014-05-12 — End: 2014-05-14

## 2014-05-12 MED ORDER — PRENATAL MULTIVITAMIN CH
1.0000 | ORAL_TABLET | Freq: Every day | ORAL | Status: DC
  Administered 2014-05-12 – 2014-05-14 (×2): 1 via ORAL
  Filled 2014-05-12: qty 1

## 2014-05-12 MED ORDER — DIBUCAINE 1 % RE OINT: 1.0000 "application " | TOPICAL_OINTMENT | RECTAL | Status: DC | PRN

## 2014-05-12 NOTE — Anesthesia Procedure Notes (Signed)
Epidural Patient location during procedure: OB Start time: 05/12/2014 8:42 AM End time: 05/12/2014 8:46 AM  Staffing Anesthesiologist: Leilani AbleHATCHETT, Nannette Zill Performed by: anesthesiologist   Preanesthetic Checklist Completed: patient identified, surgical consent, pre-op evaluation, timeout performed, IV checked, risks and benefits discussed and monitors and equipment checked  Epidural Patient position: sitting Prep: site prepped and draped and DuraPrep Patient monitoring: continuous pulse ox and blood pressure Approach: midline Location: L3-L4 Injection technique: LOR air  Needle:  Needle type: Tuohy  Needle gauge: 17 G Needle length: 9 cm and 9 Needle insertion depth: 6 cm Catheter type: closed end flexible Catheter size: 19 Gauge Catheter at skin depth: 11 cm Test dose: negative and Other  Assessment Sensory level: T9 Events: blood not aspirated, injection not painful, no injection resistance, negative IV test and no paresthesia

## 2014-05-12 NOTE — H&P (Signed)
28 y.o. 431w6d  G2P1001 comes in c/o for contractions but had a non reactive NST.  Pt was scheduled for induction tonight for CHTN.  Otherwise has good fetal movement and no bleeding.  Past Medical History  Diagnosis Date  . Hypertension   . Pregnancy induced hypertension   . Vaginal Pap smear, abnormal     LEEP, normal since  . Infection     UTI  . Anxiety   . Depression   . Asthma   . GERD (gastroesophageal reflux disease)     Past Surgical History  Procedure Laterality Date  . Lymph gland excision    . Leep      OB History  Gravida Para Term Preterm AB SAB TAB Ectopic Multiple Living  2 1 1  0 0 0 0 0 0 1    # Outcome Date GA Lbr Len/2nd Weight Sex Delivery Anes PTL Lv  2 Current           1 Term 10/13/04    F Vag-Spont EPI N Y     Comments: PIH induction      History   Social History  . Marital Status: Single    Spouse Name: N/A    Number of Children: N/A  . Years of Education: N/A   Occupational History  . Not on file.   Social History Main Topics  . Smoking status: Former Games developermoker  . Smokeless tobacco: Never Used     Comment: quit June 2015  . Alcohol Use: No  . Drug Use: No  . Sexual Activity: Yes    Birth Control/ Protection: None   Other Topics Concern  . Not on file   Social History Narrative   ** Merged History Encounter **       Sulfa antibiotics and Sulfate    Prenatal Transfer Tool  Maternal Diabetes: No Genetic Screening: Normal Maternal Ultrasounds/Referrals: Normal Fetal Ultrasounds or other Referrals:  Referred to Materal Fetal Medicine  Maternal Substance Abuse:  No Significant Maternal Medications:  Meds include: Other: labetalol Significant Maternal Lab Results: None  Other PNC: uncomplicated.    Filed Vitals:   05/12/14 0707  BP: 145/89  Pulse: 92  Temp:   Resp: 18     Lungs/Cor:  NAD Abdomen:  soft, gravid Ex:  no cords, erythema SVE:  FT/70/-2 per nurse FHTs:  130, good STV, NST R now;  Frequent very short and  shallow variables. Toco:  q 3   A/P   Term induction for CHTN- here today for NST R  GBS POS- needs PCN.  Jakiah Bienaime A

## 2014-05-12 NOTE — Anesthesia Preprocedure Evaluation (Signed)
Anesthesia Evaluation  Patient identified by MRN, date of birth, ID band Patient awake    Reviewed: Allergy & Precautions, H&P , NPO status , Patient's Chart, lab work & pertinent test results  Airway Mallampati: II  TM Distance: >3 FB Neck ROM: full    Dental no notable dental hx.    Pulmonary former smoker,    Pulmonary exam normal       Cardiovascular hypertension, Pt. on home beta blockers     Neuro/Psych negative neurological ROS     GI/Hepatic Neg liver ROS, GERD-  Medicated,  Endo/Other  negative endocrine ROS  Renal/GU negative Renal ROS     Musculoskeletal   Abdominal (+) + obese,   Peds  Hematology negative hematology ROS (+)   Anesthesia Other Findings   Reproductive/Obstetrics (+) Pregnancy                             Anesthesia Physical Anesthesia Plan  ASA: III  Anesthesia Plan: Epidural   Post-op Pain Management:    Induction:   Airway Management Planned:   Additional Equipment:   Intra-op Plan:   Post-operative Plan:   Informed Consent: I have reviewed the patients History and Physical, chart, labs and discussed the procedure including the risks, benefits and alternatives for the proposed anesthesia with the patient or authorized representative who has indicated his/her understanding and acceptance.     Plan Discussed with:   Anesthesia Plan Comments:         Anesthesia Quick Evaluation

## 2014-05-12 NOTE — MAU Note (Signed)
Pt reports it has been 3 days since she had a BM, having contractions

## 2014-05-12 NOTE — Anesthesia Postprocedure Evaluation (Signed)
Anesthesia Post Note  Patient: Kelli Wells  Procedure(s) Performed: * No procedures listed *  Anesthesia type: Epidural  Patient location: Mother/Baby  Post pain: Pain level controlled  Post assessment: Post-op Vital signs reviewed  Last Vitals:  Filed Vitals:   05/12/14 1350  BP: 135/73  Pulse: 88  Temp: 36.8 C  Resp: 17    Post vital signs: Reviewed  Level of consciousness:alert  Complications: No apparent anesthesia complications

## 2014-05-12 NOTE — Lactation Note (Signed)
This note was copied from the chart of Kelli Fifth Third BancorpShaniece Huertas. Lactation Consultation Note  Patient Name: Kelli Daleen SquibbShaniece Edgar ZOXWR'UToday's Date: 05/12/2014 Reason for consult: Initial assessment;Infant < 6lbs This is Mom's 2nd baby but 1st time BF. Mom reports she thinks baby is latching well, LC did not see latch, Mom reports baby recently fed. Mom is supplementing by choice. LC advised to BF with each feeding before giving any supplement to encourage milk production, prevent engorgement and protect milk supply. Reviewed risk of early supplementation to BF success. Encouraged to BF with feeding ques. Advised after the 1st 24 hours baby should be at the breast 8-12 times in 24 hours. Lactation brochure left for review, advised of OP services and support group. Guidelines for supplementing with breastfeeding reviewed with Mom. Encouraged to call for questions/concerns or for assist as needed.   Maternal Data Has patient been taught Hand Expression?: Yes Does the patient have breastfeeding experience prior to this delivery?: No  Feeding Feeding Type: Breast Milk Nipple Type: Slow - flow Length of feed: 20 min  LATCH Score/Interventions                      Lactation Tools Discussed/Used WIC Program: Yes   Consult Status Consult Status: Follow-up Date: 05/13/14 Follow-up type: In-patient    Alfred LevinsGranger, Surie Suchocki Ann 05/12/2014, 7:31 PM

## 2014-05-13 LAB — CBC
HCT: 38.7 % (ref 36.0–46.0)
Hemoglobin: 13.1 g/dL (ref 12.0–15.0)
MCH: 28.8 pg (ref 26.0–34.0)
MCHC: 33.9 g/dL (ref 30.0–36.0)
MCV: 85.1 fL (ref 78.0–100.0)
PLATELETS: 245 10*3/uL (ref 150–400)
RBC: 4.55 MIL/uL (ref 3.87–5.11)
RDW: 14.4 % (ref 11.5–15.5)
WBC: 12.4 10*3/uL — AB (ref 4.0–10.5)

## 2014-05-13 NOTE — Lactation Note (Signed)
This note was copied from the chart of Kelli [name]. Lactation Consultation Note: Follow up visit with mom She reports that baby has been nursing well but is still fussy after nursing so she has been giving formula too. Mom reports she just nursed baby and gave some formula. Encouraged to always BF first to promote a good milk supply. Lab in to do blood work. Mom asking about a pump. Reports she has insurance and WIC- will call Monday. Manual pump given with instructions for use and cleaning until she can get electric pump. No questions at present To call prn  Patient Name: Kelli Wells [id number] Date: 05/13/2014 Reason for consult: Follow-up assessment   Maternal Data Formula Feeding for Exclusion: Yes Reason for exclusion: Mother's choice to formula and breast feed on admission Does the patient have breastfeeding experience prior to this delivery?: No  Feeding   LATCH Score/Interventions                      Lactation Tools Discussed/Used Pump Review: Setup, frequency, and cleaning;Milk Storage Initiated by:: DW Date initiated:: 05/13/14   Consult Status Consult Status: PRN    Pamelia Hoit 05/13/2014, 2:08 PM

## 2014-05-13 NOTE — Progress Notes (Signed)
Patient is eating, ambulating, voiding.  Pain control is good.  Appropriate lochia.  No complaints.  Filed Vitals:   05/12/14 2225 05/13/14 0019 05/13/14 0645 05/13/14 1017  BP: 122/72 128/71 130/73 157/85  Pulse: 91 73 98 88  Temp:  97.7 F (36.5 C) 97.8 F (36.6 C)   TempSrc:  Oral    Resp:  18 18   Height:      Weight:      SpO2:  98%      Fundus firm Perineum without swelling. No Ct  Lab Results  Component Value Date   WBC 12.4* 05/13/2014   HGB 13.1 05/13/2014   HCT 38.7 05/13/2014   MCV 85.1 05/13/2014   PLT 245 05/13/2014    --/--/O POS (12/31 1610)  A/P Post partum day 1. Some elevated BPs yesterday, normal today.  Routine care.  Expect d/c 1/2.    Philip Aspen

## 2014-05-14 NOTE — Lactation Note (Signed)
This note was copied from the chart of Kelli [name]. Lactation Consultation Note  Patient Name: Kelli Wells [id number] Date: 05/14/2014 Reason for consult: Follow-up assessment Baby 50 hours of life. Mom reports that she has been offering breast and formula. Discussed supply and demand with mom and discussed the need to pump for additional stimulation. Mom states that she thinks she will just offer breast for a few more days and then switch to all formula because it is a lot of work. Enc mom to offer more volume with each feeding as baby tolerates, increasing gradually, and feed more often if baby cues to feed. Enc mom not to wait any longer that 3 hours between feeds, but feed earlier if baby cueing. Referred mom to Baby and Me booklet for number of diapers to expect by day of life. Discussed engorgement prevention/treatment. Mom aware of OP/BFSG and LC phone line assistance after D/C.   Maternal Data    Feeding Feeding Type: Bottle Fed - Formula Nipple Type: Slow - flow  LATCH Score/Interventions                      Lactation Tools Discussed/Used     Consult Status Consult Status: Complete    Adrena Nakamura 05/14/2014, 1:01 PM

## 2014-05-14 NOTE — Discharge Summary (Addendum)
Obstetric Discharge Summary Reason for Admission: onset of labor, NR-NST, CHTN Prenatal Procedures: ultrasound Intrapartum Procedures: spontaneous vaginal delivery Postpartum Procedures: none Complications-Operative and Postpartum: none HEMOGLOBIN  Date Value Ref Range Status  05/13/2014 13.1 12.0 - 15.0 g/dL Final   HCT  Date Value Ref Range Status  05/13/2014 38.7 36.0 - 46.0 % Final    Physical Exam:  General: alert and cooperative Lochia: appropriate Uterine Fundus: firm DVT Evaluation: No evidence of DVT seen on physical exam.  Discharge Diagnoses: Term Pregnancy-delivered  Discharge Information: Date: 05/14/2014 Activity: pelvic rest Diet: routine Medications: PNV, Ibuprofen and labetalol Condition: stable Instructions: refer to practice specific booklet Discharge to: home Follow-up Information    Follow up with Kelli A, MD.   Specialty:  Obstetrics and Gynecology   Why:  BP check   Contact information:   [address] Kelli Wells [name] [address] [phone]       Newborn Data: Live born female  Birth Weight: 5 lb 1.8 oz (2320 g) APGAR: 9, 9  Home with mother.  Kelli Wells 05/14/2014, 12:35 PM

## 2014-05-16 LAB — RPR

## 2014-05-20 ENCOUNTER — Encounter (HOSPITAL_COMMUNITY): Payer: Self-pay | Admitting: *Deleted

## 2014-05-20 ENCOUNTER — Inpatient Hospital Stay (HOSPITAL_COMMUNITY)
Admission: AD | Admit: 2014-05-20 | Discharge: 2014-05-23 | DRG: 776 | Disposition: A | Discharge status: Home / Self Care | Payer: Medicaid Other | Source: Ambulatory Visit | Attending: Obstetrics and Gynecology | Admitting: Obstetrics and Gynecology

## 2014-05-20 DIAGNOSIS — R7989 Other specified abnormal findings of blood chemistry: Secondary | ICD-10-CM | POA: Diagnosis present

## 2014-05-20 DIAGNOSIS — O1490 Unspecified pre-eclampsia, unspecified trimester: Secondary | ICD-10-CM

## 2014-05-20 DIAGNOSIS — O9089 Other complications of the puerperium, not elsewhere classified: Secondary | ICD-10-CM | POA: Diagnosis present

## 2014-05-20 DIAGNOSIS — O1093 Unspecified pre-existing hypertension complicating the puerperium: Secondary | ICD-10-CM | POA: Diagnosis not present

## 2014-05-20 DIAGNOSIS — Z3490 Encounter for supervision of normal pregnancy, unspecified, unspecified trimester: Secondary | ICD-10-CM

## 2014-05-20 LAB — COMPREHENSIVE METABOLIC PANEL
ALK PHOS: 81 U/L (ref 39–117)
ALT: 47 U/L — ABNORMAL HIGH (ref 0–35)
ANION GAP: 6 (ref 5–15)
AST: 40 U/L — ABNORMAL HIGH (ref 0–37)
Albumin: 3.6 g/dL (ref 3.5–5.2)
BILIRUBIN TOTAL: 0.5 mg/dL (ref 0.3–1.2)
BUN: 9 mg/dL (ref 6–23)
CALCIUM: 9 mg/dL (ref 8.4–10.5)
CO2: 21 mmol/L (ref 19–32)
CREATININE: 0.85 mg/dL (ref 0.50–1.10)
Chloride: 112 mEq/L (ref 96–112)
GFR calc Af Amer: >90 mL/min (ref >90)
GFR calc non Af Amer: >90 mL/min (ref >90)
Glucose, Bld: 70 mg/dL (ref 70–99)
Potassium: 3.6 mmol/L (ref 3.5–5.1)
Sodium: 139 mmol/L (ref 135–145)
Total Protein: 6.6 g/dL (ref 6.0–8.3)

## 2014-05-20 LAB — URINALYSIS, ROUTINE W REFLEX MICROSCOPIC
Bilirubin Urine: NEGATIVE
GLUCOSE, UA: NEGATIVE mg/dL
Ketones, ur: NEGATIVE mg/dL
Nitrite: NEGATIVE
Protein, ur: NEGATIVE mg/dL
UROBILINOGEN UA: 1 mg/dL (ref 0.0–1.0)
pH: 5.5 (ref 5.0–8.0)

## 2014-05-20 LAB — PROTEIN / CREATININE RATIO, URINE
Creatinine, Urine: 293 mg/dL
PROTEIN CREATININE RATIO: 0.08 (ref 0.00–0.15)
Total Protein, Urine: 24 mg/dL

## 2014-05-20 LAB — URINE MICROSCOPIC-ADD ON

## 2014-05-20 LAB — CBC
HCT: 40.4 % (ref 36.0–46.0)
HEMOGLOBIN: 13.9 g/dL (ref 12.0–15.0)
MCH: 29.1 pg (ref 26.0–34.0)
MCHC: 34.4 g/dL (ref 30.0–36.0)
MCV: 84.7 fL (ref 78.0–100.0)
PLATELETS: 271 10*3/uL (ref 150–400)
RBC: 4.77 MIL/uL (ref 3.87–5.11)
RDW: 13.8 % (ref 11.5–15.5)
WBC: 6.4 10*3/uL (ref 4.0–10.5)

## 2014-05-20 LAB — URIC ACID: URIC ACID, SERUM: 8.1 mg/dL — AB (ref 2.4–7.0)

## 2014-05-20 LAB — MRSA PCR SCREENING: MRSA by PCR: NEGATIVE

## 2014-05-20 LAB — LACTATE DEHYDROGENASE: LDH: 212 U/L (ref 94–250)

## 2014-05-20 MED ORDER — LACTATED RINGERS IV SOLN
INTRAVENOUS | Status: DC
  Administered 2014-05-20 – 2014-05-21 (×3): via INTRAVENOUS

## 2014-05-20 MED ORDER — FLUOXETINE HCL 20 MG PO CAPS
20.0000 mg | ORAL_CAPSULE | Freq: Every day | ORAL | Status: DC
  Administered 2014-05-21 – 2014-05-23 (×3): 20 mg via ORAL
  Filled 2014-05-20 (×5): qty 1

## 2014-05-20 MED ORDER — DOCUSATE SODIUM 100 MG PO CAPS
100.0000 mg | ORAL_CAPSULE | Freq: Every day | ORAL | Status: DC
  Administered 2014-05-23: 100 mg via ORAL
  Filled 2014-05-20: qty 1

## 2014-05-20 MED ORDER — ACETAMINOPHEN 500 MG PO TABS
1000.0000 mg | ORAL_TABLET | Freq: Four times a day (QID) | ORAL | Status: DC | PRN
  Administered 2014-05-21 – 2014-05-23 (×4): 1000 mg via ORAL
  Filled 2014-05-20 (×4): qty 2

## 2014-05-20 MED ORDER — ACETAMINOPHEN 500 MG PO TABS
1000.0000 mg | ORAL_TABLET | ORAL | Status: AC
  Administered 2014-05-20: 1000 mg via ORAL
  Filled 2014-05-20: qty 2

## 2014-05-20 MED ORDER — LABETALOL HCL 5 MG/ML IV SOLN
20.0000 mg | Freq: Once | INTRAVENOUS | Status: AC
  Administered 2014-05-20: 20 mg via INTRAVENOUS
  Filled 2014-05-20: qty 4

## 2014-05-20 MED ORDER — LACTATED RINGERS IV SOLN
INTRAVENOUS | Status: DC
  Administered 2014-05-20: 13:00:00 via INTRAVENOUS

## 2014-05-20 MED ORDER — ZOLPIDEM TARTRATE 5 MG PO TABS: 5.0000 mg | ORAL_TABLET | Freq: Every evening | ORAL | Status: DC | PRN

## 2014-05-20 MED ORDER — LABETALOL HCL 200 MG PO TABS
400.0000 mg | ORAL_TABLET | Freq: Two times a day (BID) | ORAL | Status: DC
Start: 2014-05-20 — End: 2014-05-22
  Administered 2014-05-20 – 2014-05-22 (×4): 400 mg via ORAL
  Filled 2014-05-20 (×6): qty 2

## 2014-05-20 MED ORDER — CALCIUM CARBONATE ANTACID 500 MG PO CHEW: 2.0000 | CHEWABLE_TABLET | ORAL | Status: DC | PRN

## 2014-05-20 MED ORDER — PRENATAL MULTIVITAMIN CH
1.0000 | ORAL_TABLET | Freq: Every day | ORAL | Status: DC
  Administered 2014-05-23: 1 via ORAL
  Filled 2014-05-20: qty 1

## 2014-05-20 MED ORDER — MAGNESIUM SULFATE 40 G IN LACTATED RINGERS - SIMPLE
2.0000 g/h | INTRAVENOUS | Status: DC
  Administered 2014-05-20 – 2014-05-21 (×2): 2 g/h via INTRAVENOUS
  Filled 2014-05-20 (×2): qty 500

## 2014-05-20 MED ORDER — MAGNESIUM SULFATE BOLUS VIA INFUSION
4.0000 g | Freq: Once | INTRAVENOUS | Status: AC
  Administered 2014-05-20: 4 g via INTRAVENOUS
  Filled 2014-05-20: qty 500

## 2014-05-20 NOTE — Progress Notes (Signed)
UR chart review completed.  

## 2014-05-20 NOTE — MAU Provider Note (Signed)
Chief Complaint: Hypertension   First Provider Initiated Contact with Patient 05/20/14 1212     SUBJECTIVE HPI: Kelli Wells is a 29 y.o. [id number] on Day 8 postpartum following NSVD who presents to maternity admissions sent from the office for elevated BP, headache, blurred vision/seeing spots, and epigastric pain.  She reports severe headache today, with changes in her vision including occasional spots and some blurred vision.  She had burning pain in her upper right abdomen during the office visit today but this has resolved now.  She had GHTN with her pregnancy without preeclampsia.  She is breast/bottle feeding her infant.  She reports normal lochia, denies vaginal itching/burning, urinary symptoms, dizziness, n/v, or fever/chills.     Past Medical History  Diagnosis Date  . Hypertension   . Pregnancy induced hypertension   . Vaginal Pap smear, abnormal     LEEP, normal since  . Infection     UTI  . Anxiety   . Depression   . Asthma   . GERD (gastroesophageal reflux disease)    Past Surgical History  Procedure Laterality Date  . Lymph gland excision    . Leep     History   Social History  . Marital Status: Single    Spouse Name: N/A    Number of Children: N/A  . Years of Education: N/A   Occupational History  . Not on file.   Social History Main Topics  . Smoking status: Former Games developer  . Smokeless tobacco: Never Used     Comment: quit June 2015  . Alcohol Use: No  . Drug Use: No  . Sexual Activity: Yes    Birth Control/ Protection: None   Other Topics Concern  . Not on file   Social History Narrative   ** Merged History Encounter **       No current facility-administered medications on file prior to encounter.   Current Outpatient Prescriptions on File Prior to Encounter  Medication Sig Dispense Refill  . acetaminophen (TYLENOL) 500 MG tablet Take 1,000 mg by mouth every 6 (six) hours as needed for headache.     . labetalol (NORMODYNE) 200 MG tablet  Take 400 mg by mouth 2 (two) times daily.    . Prenatal Vit-Fe Fumarate-FA (PRENATAL MULTIVITAMIN) TABS tablet Take 1 tablet by mouth daily at 12 noon.    Marland Kitchen albuterol (PROVENTIL HFA;VENTOLIN HFA) 108 (90 BASE) MCG/ACT inhaler Inhale 2 puffs into the lungs every 6 (six) hours as needed for wheezing or shortness of breath.     Allergies  Allergen Reactions  . Sulfa Antibiotics Anaphylaxis and Swelling    ROS: Pertinent items in HPI  OBJECTIVE Blood pressure 199/97, pulse 62, temperature 99.5 F (37.5 C), resp. rate 16, last menstrual period 08/20/2013, unknown if currently breastfeeding.  Patient Vitals for the past 24 hrs:  BP Temp Pulse Resp  05/20/14 1337 199/97 mmHg - 62 -  05/20/14 1320 (!) 193/104 mmHg - 61 -  05/20/14 1310 (!) 195/104 mmHg - 64 -  05/20/14 1257 (!) 193/106 mmHg - 63 -  05/20/14 1247 193/98 mmHg - 62 -  05/20/14 1238 (!) 193/102 mmHg - (!) 57 -  05/20/14 1235 (!) 193/102 mmHg - 60 -  05/20/14 1210 (!) 190/109 mmHg - 66 -  05/20/14 1157 (!) 190/104 mmHg - 64 -  05/20/14 1153 (!) 176/108 mmHg - 66 16  05/20/14 1141 178/97 mmHg 99.5 F (37.5 C) 68 16   GENERAL: Well-developed, well-nourished female in no acute distress.  HEENT: Normocephalic HEART: normal rate, heart sounds, regular rhythm RESP: normal effort, lung sounds clear and equal bilaterally ABDOMEN: Soft, non-tender EXTREMITIES: Nontender, no edema NEURO: Alert and oriented   LAB RESULTS Results for orders placed or performed during the hospital encounter of 05/20/14 (from the past 24 hour(s))  Protein / creatinine ratio, urine     Status: None   Collection Time: 05/20/14 11:40 AM  Result Value Ref Range   Creatinine, Urine 293.00 mg/dL   Total Protein, Urine 24 mg/dL   Protein Creatinine Ratio 0.08 0.00 - 0.15  Urinalysis, Routine w reflex microscopic     Status: Abnormal   Collection Time: 05/20/14 11:46 AM  Result Value Ref Range   Color, Urine YELLOW YELLOW   APPearance CLEAR CLEAR    Specific Gravity, Urine >1.030 (H) 1.005 - 1.030   pH 5.5 5.0 - 8.0   Glucose, UA NEGATIVE NEGATIVE mg/dL   Hgb urine dipstick LARGE (A) NEGATIVE   Bilirubin Urine NEGATIVE NEGATIVE   Ketones, ur NEGATIVE NEGATIVE mg/dL   Protein, ur NEGATIVE NEGATIVE mg/dL   Urobilinogen, UA 1.0 0.0 - 1.0 mg/dL   Nitrite NEGATIVE NEGATIVE   Leukocytes, UA TRACE (A) NEGATIVE  Urine microscopic-add on     Status: Abnormal   Collection Time: 05/20/14 11:46 AM  Result Value Ref Range   Squamous Epithelial / LPF FEW (A) RARE   WBC, UA 0-2 <3 WBC/hpf   RBC / HPF 7-10 <3 RBC/hpf  CBC     Status: None   Collection Time: 05/20/14 12:25 PM  Result Value Ref Range   WBC 6.4 4.0 - 10.5 K/uL   RBC 4.77 3.87 - 5.11 MIL/uL   Hemoglobin 13.9 12.0 - 15.0 g/dL   HCT 16.140.4 09.636.0 - 04.546.0 %   MCV 84.7 78.0 - 100.0 fL   MCH 29.1 26.0 - 34.0 pg   MCHC 34.4 30.0 - 36.0 g/dL   RDW 40.913.8 81.111.5 - 91.415.5 %   Platelets 271 150 - 400 K/uL  Comprehensive metabolic panel     Status: Abnormal   Collection Time: 05/20/14 12:25 PM  Result Value Ref Range   Sodium 139 135 - 145 mmol/L   Potassium 3.6 3.5 - 5.1 mmol/L   Chloride 112 96 - 112 mEq/L   CO2 21 19 - 32 mmol/L   Glucose, Bld 70 70 - 99 mg/dL   BUN 9 6 - 23 mg/dL   Creatinine, Ser 7.820.85 0.50 - 1.10 mg/dL   Calcium 9.0 8.4 - 95.610.5 mg/dL   Total Protein 6.6 6.0 - 8.3 g/dL   Albumin 3.6 3.5 - 5.2 g/dL   AST 40 (H) 0 - 37 U/L   ALT 47 (H) 0 - 35 U/L   Alkaline Phosphatase 81 39 - 117 U/L   Total Bilirubin 0.5 0.3 - 1.2 mg/dL   GFR calc non Af Amer >90 >90 mL/min   GFR calc Af Amer >90 >90 mL/min   Anion gap 6 5 - 15  Uric acid     Status: Abnormal   Collection Time: 05/20/14 12:25 PM  Result Value Ref Range   Uric Acid, Serum 8.1 (H) 2.4 - 7.0 mg/dL  Lactate dehydrogenase     Status: None   Collection Time: 05/20/14 12:25 PM  Result Value Ref Range   LDH 212 94 - 250 U/L   ASSESSMENT 1. Preeclampsia in postpartum period, unspecified trimester     PLAN Consult  Dr Dareen PianoAnderson Admit to hospital Mag sulfate 4 g bolus/2g per  hour Discussed rooming in with baby, pt has to have support person here but baby can stay in room     Medication List    ASK your doctor about these medications        acetaminophen 500 MG tablet  Commonly known as:  TYLENOL  Take 1,000 mg by mouth every 6 (six) hours as needed for headache.     albuterol 108 (90 BASE) MCG/ACT inhaler  Commonly known as:  PROVENTIL HFA;VENTOLIN HFA  Inhale 2 puffs into the lungs every 6 (six) hours as needed for wheezing or shortness of breath.     FLUoxetine 20 MG capsule  Commonly known as:  PROZAC  Take 20 mg by mouth daily.     labetalol 200 MG tablet  Commonly known as:  NORMODYNE  Take 400 mg by mouth 2 (two) times daily.     prenatal multivitamin Tabs tablet  Take 1 tablet by mouth daily at 12 noon.         Sharen Counter Certified Nurse-Midwife 05/20/2014  1:59 PM

## 2014-05-20 NOTE — MAU Note (Signed)
Pt presents to MAU with complaints of high blood pressure. She was evaluated in the office today for her blood pressure due to increase with blood pressure with her pregnancy which she delivered on December the 31st. Pt is taking labetalol 800mg  a day at this time

## 2014-05-21 LAB — COMPREHENSIVE METABOLIC PANEL
ALT: 42 U/L — ABNORMAL HIGH (ref 0–35)
ANION GAP: 6 (ref 5–15)
AST: 38 U/L — AB (ref 0–37)
Albumin: 3.2 g/dL — ABNORMAL LOW (ref 3.5–5.2)
Alkaline Phosphatase: 86 U/L (ref 39–117)
BUN: 6 mg/dL (ref 6–23)
CHLORIDE: 109 meq/L (ref 96–112)
CO2: 23 mmol/L (ref 19–32)
Calcium: 7 mg/dL — ABNORMAL LOW (ref 8.4–10.5)
Creatinine, Ser: 0.81 mg/dL (ref 0.50–1.10)
Glucose, Bld: 117 mg/dL — ABNORMAL HIGH (ref 70–99)
Potassium: 3.4 mmol/L — ABNORMAL LOW (ref 3.5–5.1)
Sodium: 138 mmol/L (ref 135–145)
Total Bilirubin: 0.5 mg/dL (ref 0.3–1.2)
Total Protein: 5.9 g/dL — ABNORMAL LOW (ref 6.0–8.3)

## 2014-05-21 NOTE — H&P (Signed)
Pt is a 29 y/o black female, now G2P2 S/P SVD on 05/12/14 who presented to the office for a follow up B/P check. At the time her B/P was elevated, she had a headache, vision changes. Pt has a h.o. CHTN . She was on Lisinopril prior to preg. She was changed to Labetalol during preg. She took Labetalol 400mg  BID just prior to delivery. She was induced and delivered on 05/12/14. The delivery and PP course was uncomplicated. She was not sent home on B/P meds but did continue her Labetalol because she had meds at home. She noticed an increase in edema in the last 2-3 days. B/P on admission was 180/110. She had mild elevations in her LFTs and an elevated uric acid.   PE: HEENT- wnl        Abd -soft non tender, no RUQ pain        Lochia- normal.         Exts-1+ edema, normal DTR IMP/ PP with Preeclampsia         CHTN         Abnl LFTs and severe B/P Plan/ Will admit for MgSO4           Recheck LFTs today.           Look for diuresis.

## 2014-05-22 DIAGNOSIS — Z3490 Encounter for supervision of normal pregnancy, unspecified, unspecified trimester: Secondary | ICD-10-CM

## 2014-05-22 MED ORDER — NIFEDIPINE ER OSMOTIC RELEASE 30 MG PO TB24: 30.0000 mg | ORAL_TABLET | Freq: Every day | ORAL | Status: DC

## 2014-05-22 MED ORDER — NIFEDIPINE ER 30 MG PO TB24
30.0000 mg | ORAL_TABLET | Freq: Once | ORAL | Status: AC
  Administered 2014-05-22: 30 mg via ORAL
  Filled 2014-05-22: qty 1

## 2014-05-22 MED ORDER — LABETALOL HCL 200 MG PO TABS
600.0000 mg | ORAL_TABLET | Freq: Two times a day (BID) | ORAL | Status: DC
  Administered 2014-05-22 – 2014-05-23 (×2): 600 mg via ORAL
  Filled 2014-05-22: qty 3
  Filled 2014-05-22 (×2): qty 2

## 2014-05-22 MED ORDER — LABETALOL HCL 200 MG PO TABS
400.0000 mg | ORAL_TABLET | Freq: Once | ORAL | Status: AC
  Administered 2014-05-22: 400 mg via ORAL
  Filled 2014-05-22: qty 2

## 2014-05-22 NOTE — Progress Notes (Signed)
   05/22/14 2100  Vitals  BP (!) 167/98 mmHg  MAP (mmHg) 116  BP Location Left Arm  BP Method Automatic  Patient Position (if appropriate) Sitting  Pulse Rate 67  Resp 20  PCA/Epidural/Spinal Assessment  Respiratory Pattern Regular;Unlabored;Symmetrical  Oxygen Therapy  SpO2 97 %  O2 Device Room Air   MD increase Labetalol to 600 mg twice daily. We will continue to monitor. Patient education done regarding medication, it's action and effects. Pt verbalized understanding.

## 2014-05-22 NOTE — Progress Notes (Signed)
Pt with out c/o. Pt beginning to diuresis. B/P still elevated. Will add Procardia XL 30mg .  PLAN/ Will discharge to home and repeat B/P TID. Will recheck this week in office.

## 2014-05-22 NOTE — Progress Notes (Signed)
Pt sleeping. 

## 2014-05-22 NOTE — Progress Notes (Signed)
   05/22/14 0900  Vitals  BP (!) 171/94 mmHg  MAP (mmHg) 114  Dr. Dareen PianoAnderson made aware. See new orders

## 2014-05-22 NOTE — Progress Notes (Signed)
Pts BP's remain elevated, despite changing medications-168-180/83-87. Pt currently without complaints. Discussed with Dr. Dareen PianoAnderson. Plan to discontinue discharge order and monitor overnight. POC also discussed with pt and family-both verbalized understanding.

## 2014-05-22 NOTE — Progress Notes (Signed)
   05/22/14 1333  Vitals  BP (!) 183/97 mmHg  MAP (mmHg) 119  Pulse Rate 71  Oxygen Therapy  SpO2 98 %  Dr. Dareen PianoAnderson made aware. See new orders

## 2014-05-22 NOTE — Progress Notes (Signed)
   05/22/14 0832  Vitals  BP (!) 175/93 mmHg  MAP (mmHg) 114  baby crying and pt helping FOB

## 2014-05-23 MED ORDER — NIFEDIPINE 10 MG PO CAPS
30.0000 mg | ORAL_CAPSULE | Freq: Once | ORAL | Status: AC
  Administered 2014-05-23: 30 mg via ORAL
  Filled 2014-05-23: qty 3

## 2014-05-23 MED ORDER — NIFEDIPINE ER 30 MG PO TB24: 30.0000 mg | ORAL_TABLET | Freq: Every day | ORAL | Status: DC

## 2014-05-23 MED ORDER — LABETALOL HCL 300 MG PO TABS: 600.0000 mg | ORAL_TABLET | Freq: Two times a day (BID) | ORAL | Status: DC

## 2014-05-23 MED ORDER — NIFEDIPINE ER 30 MG PO TB24
30.0000 mg | ORAL_TABLET | Freq: Every day | ORAL | Status: DC
  Filled 2014-05-23 (×2): qty 1

## 2014-05-23 NOTE — Progress Notes (Signed)
Pt discharged home independently. Given discharge instructions and instructions to see MD office tomorrow AM for BP checks. No distress noted. Patient verbalized understanding of discharge instructions.   Rosita FireLindsey B RN

## 2014-05-23 NOTE — Progress Notes (Signed)
Ur chart review completed.  

## 2014-05-23 NOTE — Progress Notes (Signed)
[facility] Blood pressure much improved 158/88 (105) HR 79 . Patient ambulated off unit with baby. All belongings taken with her and was assisted by Porscha NT.

## 2014-05-23 NOTE — Discharge Summary (Signed)
  Pt was admitted postpartum for elevated blood pressures and monitored in ICU.  She was started on Procardia XL with good control and transferred to Women' Unit.  Procardia XL was not ordered initially on women's unit and BPs became severe range again.  Procardia XL was restarted and pt was monitored an additional 6hrs with normal and mild range BPs.  Pt very much wanted to be discharged home.  She denied any HA, vision change or RUQ pain and had no other complaints.  She was discharged on labetalol 600bid and Procardia XL 30 qd with instruction to f/u in office for BP check tomorrow.

## 2014-08-31 ENCOUNTER — Ambulatory Visit (HOSPITAL_COMMUNITY): Payer: Medicaid Other | Admitting: Psychology

## 2014-12-16 ENCOUNTER — Encounter (HOSPITAL_COMMUNITY): Payer: Self-pay | Admitting: Emergency Medicine

## 2014-12-16 DIAGNOSIS — K219 Gastro-esophageal reflux disease without esophagitis: Secondary | ICD-10-CM | POA: Insufficient documentation

## 2014-12-16 DIAGNOSIS — F329 Major depressive disorder, single episode, unspecified: Secondary | ICD-10-CM | POA: Diagnosis not present

## 2014-12-16 DIAGNOSIS — F419 Anxiety disorder, unspecified: Secondary | ICD-10-CM | POA: Diagnosis not present

## 2014-12-16 DIAGNOSIS — Z79899 Other long term (current) drug therapy: Secondary | ICD-10-CM | POA: Insufficient documentation

## 2014-12-16 DIAGNOSIS — R1011 Right upper quadrant pain: Secondary | ICD-10-CM | POA: Diagnosis present

## 2014-12-16 DIAGNOSIS — K8010 Calculus of gallbladder with chronic cholecystitis without obstruction: Secondary | ICD-10-CM | POA: Diagnosis not present

## 2014-12-16 DIAGNOSIS — I10 Essential (primary) hypertension: Secondary | ICD-10-CM | POA: Diagnosis not present

## 2014-12-16 DIAGNOSIS — J45909 Unspecified asthma, uncomplicated: Secondary | ICD-10-CM | POA: Insufficient documentation

## 2014-12-16 DIAGNOSIS — Z87891 Personal history of nicotine dependence: Secondary | ICD-10-CM | POA: Insufficient documentation

## 2014-12-16 LAB — CBC
HEMATOCRIT: 44 % (ref 36.0–46.0)
HEMOGLOBIN: 15.6 g/dL — AB (ref 12.0–15.0)
MCH: 30.2 pg (ref 26.0–34.0)
MCHC: 35.5 g/dL (ref 30.0–36.0)
MCV: 85.3 fL (ref 78.0–100.0)
Platelets: 285 10*3/uL (ref 150–400)
RBC: 5.16 MIL/uL — ABNORMAL HIGH (ref 3.87–5.11)
RDW: 12.9 % (ref 11.5–15.5)
WBC: 9.4 10*3/uL (ref 4.0–10.5)

## 2014-12-16 LAB — LIPASE, BLOOD: LIPASE: 23 U/L (ref 22–51)

## 2014-12-16 LAB — URINALYSIS, ROUTINE W REFLEX MICROSCOPIC
Bilirubin Urine: NEGATIVE
GLUCOSE, UA: NEGATIVE mg/dL
HGB URINE DIPSTICK: NEGATIVE
Ketones, ur: NEGATIVE mg/dL
LEUKOCYTES UA: NEGATIVE
NITRITE: NEGATIVE
Protein, ur: NEGATIVE mg/dL
SPECIFIC GRAVITY, URINE: 1.019 (ref 1.005–1.030)
Urobilinogen, UA: 1 mg/dL (ref 0.0–1.0)
pH: 7 (ref 5.0–8.0)

## 2014-12-16 LAB — I-STAT BETA HCG BLOOD, ED (MC, WL, AP ONLY)

## 2014-12-16 NOTE — ED Notes (Addendum)
Pt from home for eval of ongoing abd pain x2 months, pt states diagnosed with gall stones but has not had time to set up a surgery. Pt denies any emesis or diarrhea or fevers but reports nausea. Pt hypertensive in triage, states takes labetolol but has not been helping today. Pt uncomfortable in triage. nad noted.

## 2014-12-17 ENCOUNTER — Observation Stay (HOSPITAL_COMMUNITY)
Admission: EM | Admit: 2014-12-17 | Discharge: 2014-12-19 | Disposition: A | Discharge status: Home / Self Care | Payer: Medicaid Other | Attending: Surgery | Admitting: Surgery

## 2014-12-17 ENCOUNTER — Emergency Department (HOSPITAL_COMMUNITY): Payer: Medicaid Other

## 2014-12-17 DIAGNOSIS — K8010 Calculus of gallbladder with chronic cholecystitis without obstruction: Secondary | ICD-10-CM | POA: Diagnosis present

## 2014-12-17 DIAGNOSIS — Z09 Encounter for follow-up examination after completed treatment for conditions other than malignant neoplasm: Secondary | ICD-10-CM

## 2014-12-17 DIAGNOSIS — K819 Cholecystitis, unspecified: Secondary | ICD-10-CM

## 2014-12-17 DIAGNOSIS — R1011 Right upper quadrant pain: Secondary | ICD-10-CM

## 2014-12-17 LAB — COMPREHENSIVE METABOLIC PANEL
ALK PHOS: 110 U/L (ref 38–126)
ALT: 17 U/L (ref 14–54)
AST: 24 U/L (ref 15–41)
Albumin: 4.1 g/dL (ref 3.5–5.0)
Anion gap: 13 (ref 5–15)
BILIRUBIN TOTAL: 0.4 mg/dL (ref 0.3–1.2)
BUN: 10 mg/dL (ref 6–20)
CHLORIDE: 105 mmol/L (ref 101–111)
CO2: 20 mmol/L — AB (ref 22–32)
CREATININE: 0.82 mg/dL (ref 0.44–1.00)
Calcium: 9.8 mg/dL (ref 8.9–10.3)
Glucose, Bld: 95 mg/dL (ref 65–99)
Potassium: 3.8 mmol/L (ref 3.5–5.1)
Sodium: 138 mmol/L (ref 135–145)
TOTAL PROTEIN: 7 g/dL (ref 6.5–8.1)

## 2014-12-17 MED ORDER — HYDROMORPHONE HCL 1 MG/ML IJ SOLN
1.0000 mg | Freq: Once | INTRAMUSCULAR | Status: AC
  Administered 2014-12-17: 1 mg via INTRAVENOUS
  Filled 2014-12-17: qty 1

## 2014-12-17 MED ORDER — MORPHINE SULFATE 4 MG/ML IJ SOLN
4.0000 mg | Freq: Once | INTRAMUSCULAR | Status: AC
  Administered 2014-12-17: 4 mg via INTRAVENOUS
  Filled 2014-12-17: qty 1

## 2014-12-17 MED ORDER — PANTOPRAZOLE SODIUM 40 MG IV SOLR
40.0000 mg | Freq: Every day | INTRAVENOUS | Status: DC
Start: 2014-12-17 — End: 2014-12-19
  Administered 2014-12-17 – 2014-12-18 (×2): 40 mg via INTRAVENOUS
  Filled 2014-12-17 (×2): qty 40

## 2014-12-17 MED ORDER — ONDANSETRON HCL 4 MG/2ML IJ SOLN
4.0000 mg | Freq: Four times a day (QID) | INTRAMUSCULAR | Status: DC | PRN
  Administered 2014-12-17 (×2): 4 mg via INTRAVENOUS
  Filled 2014-12-17 (×2): qty 2

## 2014-12-17 MED ORDER — CEFTRIAXONE SODIUM 2 G IJ SOLR
2.0000 g | INTRAMUSCULAR | Status: DC
  Administered 2014-12-17 – 2014-12-19 (×2): 2 g via INTRAVENOUS
  Filled 2014-12-17 (×3): qty 2

## 2014-12-17 MED ORDER — ONDANSETRON HCL 4 MG/2ML IJ SOLN
4.0000 mg | Freq: Once | INTRAMUSCULAR | Status: AC
  Administered 2014-12-17: 4 mg via INTRAVENOUS
  Filled 2014-12-17: qty 2

## 2014-12-17 MED ORDER — LABETALOL HCL 100 MG PO TABS
600.0000 mg | ORAL_TABLET | Freq: Two times a day (BID) | ORAL | Status: DC
  Administered 2014-12-17 – 2014-12-18 (×3): 600 mg via ORAL
  Filled 2014-12-17 (×4): qty 6

## 2014-12-17 MED ORDER — NORETHINDRONE 0.35 MG PO TABS
1.0000 | ORAL_TABLET | Freq: Every day | ORAL | Status: DC
  Administered 2014-12-17: 0.35 mg via ORAL
  Filled 2014-12-17: qty 1

## 2014-12-17 MED ORDER — HYDRALAZINE HCL 20 MG/ML IJ SOLN
10.0000 mg | INTRAMUSCULAR | Status: DC | PRN
  Administered 2014-12-17 (×2): 10 mg via INTRAVENOUS
  Filled 2014-12-17 (×2): qty 1

## 2014-12-17 MED ORDER — MORPHINE SULFATE 2 MG/ML IJ SOLN
1.0000 mg | INTRAMUSCULAR | Status: DC | PRN
  Administered 2014-12-17: 4 mg via INTRAVENOUS
  Administered 2014-12-17: 2 mg via INTRAVENOUS
  Administered 2014-12-18: 4 mg via INTRAVENOUS
  Administered 2014-12-18: 2 mg via INTRAVENOUS
  Administered 2014-12-18 (×2): 4 mg via INTRAVENOUS
  Administered 2014-12-18: 2 mg via INTRAVENOUS
  Administered 2014-12-18 (×3): 4 mg via INTRAVENOUS
  Administered 2014-12-19 (×3): 2 mg via INTRAVENOUS
  Filled 2014-12-17 (×3): qty 2
  Filled 2014-12-17: qty 1
  Filled 2014-12-17: qty 2
  Filled 2014-12-17: qty 1
  Filled 2014-12-17 (×2): qty 2
  Filled 2014-12-17 (×4): qty 1
  Filled 2014-12-17: qty 2

## 2014-12-17 MED ORDER — KCL IN DEXTROSE-NACL 20-5-0.9 MEQ/L-%-% IV SOLN
INTRAVENOUS | Status: DC
  Administered 2014-12-17 – 2014-12-19 (×4): via INTRAVENOUS
  Filled 2014-12-17 (×8): qty 1000

## 2014-12-17 MED ORDER — PRENATAL MULTIVITAMIN CH
1.0000 | ORAL_TABLET | Freq: Every day | ORAL | Status: DC
  Administered 2014-12-17 – 2014-12-19 (×3): 1 via ORAL
  Filled 2014-12-17 (×3): qty 1

## 2014-12-17 MED ORDER — ONDANSETRON 4 MG PO TBDP
4.0000 mg | ORAL_TABLET | Freq: Four times a day (QID) | ORAL | Status: DC | PRN
  Filled 2014-12-17: qty 1

## 2014-12-17 MED ORDER — METHOCARBAMOL 500 MG PO TABS
1000.0000 mg | ORAL_TABLET | Freq: Three times a day (TID) | ORAL | Status: DC | PRN
  Administered 2014-12-19: 1000 mg via ORAL
  Filled 2014-12-17: qty 2

## 2014-12-17 MED ORDER — FAMOTIDINE 20 MG PO TABS: 20.0000 mg | ORAL_TABLET | Freq: Two times a day (BID) | ORAL | Status: DC | PRN

## 2014-12-17 MED ORDER — ALBUTEROL SULFATE (2.5 MG/3ML) 0.083% IN NEBU: 3.0000 mL | INHALATION_SOLUTION | Freq: Four times a day (QID) | RESPIRATORY_TRACT | Status: DC | PRN

## 2014-12-17 MED ORDER — FLUOXETINE HCL 20 MG PO CAPS
20.0000 mg | ORAL_CAPSULE | Freq: Every day | ORAL | Status: DC
  Administered 2014-12-17 – 2014-12-19 (×2): 20 mg via ORAL
  Filled 2014-12-17 (×2): qty 1

## 2014-12-17 MED ORDER — LABETALOL HCL 5 MG/ML IV SOLN
10.0000 mg | INTRAVENOUS | Status: DC | PRN
  Filled 2014-12-17: qty 4

## 2014-12-17 MED ORDER — LABETALOL HCL 5 MG/ML IV SOLN
10.0000 mg | INTRAVENOUS | Status: DC | PRN
  Administered 2014-12-17: 10 mg via INTRAVENOUS
  Filled 2014-12-17 (×2): qty 4

## 2014-12-17 MED ORDER — NORETHINDRONE 0.35 MG PO TABS: 1.0000 | ORAL_TABLET | Freq: Every day | ORAL | Status: DC

## 2014-12-17 NOTE — H&P (Signed)
Kelli Wells is an 29 y.o. female.   Chief Complaint: abdominal pain HPI: The pt is a 29yo bf who presents to the ER with a 1 day history of abdominal pain. Pain is in the epigastric and RUQ. Pain is constant. It is associated with nausea but no vomiting. Denies fever. She has been having this pain for the last year but hasn't been this bad. She came to ER where u/s showed gallstones and thickened gallbladder wall. lft's normal  Past Medical History  Diagnosis Date  . Hypertension   . Pregnancy induced hypertension   . Vaginal Pap smear, abnormal     LEEP, normal since  . Infection     UTI  . Anxiety   . Depression   . Asthma   . GERD (gastroesophageal reflux disease)     Past Surgical History  Procedure Laterality Date  . Lymph gland excision    . Leep      Family History  Problem Relation Age of Onset  . Hypertension Mother   . Cancer Mother     rectal  . Hypertension Father   . Hypertension Maternal Grandmother   . Cancer Maternal Grandmother     breast  . Hypertension Maternal Grandfather   . Kidney disease Paternal Grandmother   . Heart disease Paternal Grandfather   . Hearing loss Neg Hx    Social History:  reports that she has quit smoking. She has never used smokeless tobacco. She reports that she does not drink alcohol or use illicit drugs.  Allergies:  Allergies  Allergen Reactions  . Sulfa Antibiotics Anaphylaxis and Swelling     (Not in a hospital admission)  Results for orders placed or performed during the hospital encounter of 12/17/14 (from the past 48 hour(s))  Urinalysis, Routine w reflex microscopic (not at [facility])     Status: Abnormal   Collection Time: 12/16/14 10:58 PM  Result Value Ref Range   Color, Urine YELLOW YELLOW   APPearance HAZY (A) CLEAR   Specific Gravity, Urine 1.019 1.005 - 1.030   pH 7.0 5.0 - 8.0   Glucose, UA NEGATIVE NEGATIVE mg/dL   Hgb urine dipstick NEGATIVE NEGATIVE   Bilirubin Urine NEGATIVE NEGATIVE   Ketones,  ur NEGATIVE NEGATIVE mg/dL   Protein, ur NEGATIVE NEGATIVE mg/dL   Urobilinogen, UA 1.0 0.0 - 1.0 mg/dL   Nitrite NEGATIVE NEGATIVE   Leukocytes, UA NEGATIVE NEGATIVE    Comment: MICROSCOPIC NOT DONE ON URINES WITH NEGATIVE PROTEIN, BLOOD, LEUKOCYTES, NITRITE, OR GLUCOSE <1000 mg/dL.  Lipase, blood     Status: None   Collection Time: 12/16/14 10:59 PM  Result Value Ref Range   Lipase 23 22 - 51 U/L  Comprehensive metabolic panel     Status: Abnormal   Collection Time: 12/16/14 10:59 PM  Result Value Ref Range   Sodium 138 135 - 145 mmol/L   Potassium 3.8 3.5 - 5.1 mmol/L   Chloride 105 101 - 111 mmol/L   CO2 20 (L) 22 - 32 mmol/L   Glucose, Bld 95 65 - 99 mg/dL   BUN 10 6 - 20 mg/dL   Creatinine, Ser 0.82 0.44 - 1.00 mg/dL   Calcium 9.8 8.9 - 10.3 mg/dL   Total Protein 7.0 6.5 - 8.1 g/dL   Albumin 4.1 3.5 - 5.0 g/dL   AST 24 15 - 41 U/L   ALT 17 14 - 54 U/L   Alkaline Phosphatase 110 38 - 126 U/L   Total Bilirubin 0.4 0.3 -  1.2 mg/dL   GFR calc non Af Amer >60 >60 mL/min   GFR calc Af Amer >60 >60 mL/min    Comment: (NOTE) The eGFR has been calculated using the CKD EPI equation. This calculation has not been validated in all clinical situations. eGFR's persistently <60 mL/min signify possible Chronic Kidney Disease.    Anion gap 13 5 - 15  CBC     Status: Abnormal   Collection Time: 12/16/14 10:59 PM  Result Value Ref Range   WBC 9.4 4.0 - 10.5 K/uL   RBC 5.16 (H) 3.87 - 5.11 MIL/uL   Hemoglobin 15.6 (H) 12.0 - 15.0 g/dL   HCT 44.0 36.0 - 46.0 %   MCV 85.3 78.0 - 100.0 fL   MCH 30.2 26.0 - 34.0 pg   MCHC 35.5 30.0 - 36.0 g/dL   RDW 12.9 11.5 - 15.5 %   Platelets 285 150 - 400 K/uL  I-Stat beta hCG blood, ED ([facility], [facility], [facility] only)     Status: None   Collection Time: 12/16/14 11:06 PM  Result Value Ref Range   I-stat hCG, quantitative <5.0 <5 mIU/mL   Comment 3            Comment:   GEST. AGE      CONC.  (mIU/mL)   <=1 WEEK        5 - 50     2 WEEKS       50 - 500      3 WEEKS       100 - 10,000     4 WEEKS     1,000 - 30,000        FEMALE AND NON-PREGNANT FEMALE:     LESS THAN 5 mIU/mL    US Abdomen Limited Ruq  12/17/2014   CLINICAL DATA:  Acute onset of right upper quadrant abdominal pain. Initial encounter.  EXAM: US ABDOMEN LIMITED - RIGHT UPPER QUADRANT  COMPARISON:  None.  FINDINGS: Gallbladder:  Diffuse gallbladder wall thickening is noted, measuring up to 4 mm, with a small amount of pericholecystic fluid. Stones are seen layering dependently within the gallbladder, measuring up to 1.1 cm in size. Evaluation for ultrasonographic Murphy's sign is limited, as the patient has been given pain medication.  Common bile duct:  Diameter: 0.3 cm, within normal limits in caliber.  Liver:  No focal lesion identified. Within normal limits in parenchymal echogenicity.  IMPRESSION: Diffuse gallbladder wall thickening, with a small amount of pericholecystic fluid. Underlying cholelithiasis noted. This raises concern for acute cholecystitis. Evaluation for ultrasonographic Murphy's sign is limited, as the patient has been given pain medication.   Electronically Signed   By: Garald Balding M.D.   On: 12/17/2014 01:29    Review of Systems  Constitutional: Negative.   HENT: Negative.   Eyes: Negative.   Respiratory: Negative.   Cardiovascular: Negative.   Gastrointestinal: Positive for nausea and abdominal pain.  Genitourinary: Negative.   Musculoskeletal: Positive for back pain.  Skin: Negative.   Neurological: Negative.   Endo/Heme/Allergies: Negative.   Psychiatric/Behavioral: Negative.     Blood pressure 181/102, pulse 65, temperature 97.3 F (36.3 C), temperature source Oral, resp. rate 16, height $RemoveBe'5\' 4"'[id number]$  (1.626 m), SpO2 94 %, currently breastfeeding. Physical Exam  Constitutional: She is oriented to person, place, and time. She appears well-developed and well-nourished.  HENT:  Head: Normocephalic and atraumatic.  Eyes: Conjunctivae and EOM are normal.  Pupils are equal, round, and reactive to light.  Neck: Normal range  of motion. Neck supple.  Cardiovascular: Normal rate, regular rhythm and normal heart sounds.   Respiratory: Effort normal and breath sounds normal.  GI: Soft.  There is moderate tenderness in the epigastric and RUQ  Musculoskeletal: Normal range of motion.  Neurological: She is alert and oriented to person, place, and time.  Skin: Skin is warm and dry.  Psychiatric: She has a normal mood and affect. Her behavior is normal.     Assessment/Plan The pt has symptomatic cholecystitis with cholelithiasis. I will plan to admit for bowel rest and IV hydration. Will start on abx. She will probably benefit from having gallbladder removed during this admission.  TOTH III,PAUL S 12/17/2014, 5:48 AM

## 2014-12-17 NOTE — Progress Notes (Signed)
[facility] Progress Note     Subjective: Pt having pain, but thirsty.  No N/V.  Ambulating some OOB.  Pain well controlled.  Having flatus, urinating well.    Objective: Vital signs in last 24 hours: Temp:  [97.3 F (36.3 C)-97.8 F (36.6 C)] 97.8 F (36.6 C) (08/06 0651) Pulse Rate:  [65-96] 66 (08/06 0700) Resp:  [16-17] 17 (08/06 0651) BP: (163-198)/(102-121) 171/120 mmHg (08/06 0700) SpO2:  [94 %-100 %] 99 % (08/06 0651) Weight:  [79.969 kg (176 lb 4.8 oz)] 79.969 kg (176 lb 4.8 oz) (08/06 0651)    Intake/Output from previous day:   Intake/Output this shift:    PE: Gen:  Alert, NAD, pleasant Abd: Soft, ND, tender to palpation to RUQ and epigastrium, +BS, no HSM   Lab Results:   Recent Labs  12/16/14 2259  WBC 9.4  HGB 15.6*  HCT 44.0  PLT 285   BMET  Recent Labs  12/16/14 2259  NA 138  K 3.8  CL 105  CO2 20*  GLUCOSE 95  BUN 10  CREATININE 0.82  CALCIUM 9.8   PT/INR No results for input(s): LABPROT, INR in the last 72 hours. CMP     Component Value Date/Time   NA 138 12/16/2014 2259   NA 135* 03/22/2013 0320   K 3.8 12/16/2014 2259   K 3.9 03/22/2013 0320   CL 105 12/16/2014 2259   CL 105 03/22/2013 0320   CO2 20* 12/16/2014 2259   CO2 24 03/22/2013 0320   GLUCOSE 95 12/16/2014 2259   GLUCOSE 99 03/22/2013 0320   BUN 10 12/16/2014 2259   BUN 11 03/22/2013 0320   CREATININE 0.82 12/16/2014 2259   CREATININE 0.83 03/22/2013 0320   CALCIUM 9.8 12/16/2014 2259   CALCIUM 9.7 03/22/2013 0320   PROT 7.0 12/16/2014 2259   PROT 7.7 03/22/2013 0320   ALBUMIN 4.1 12/16/2014 2259   ALBUMIN 3.8 03/22/2013 0320   AST 24 12/16/2014 2259   AST 23 03/22/2013 0320   ALT 17 12/16/2014 2259   ALT 21 03/22/2013 0320   ALKPHOS 110 12/16/2014 2259   ALKPHOS 102 03/22/2013 0320   BILITOT 0.4 12/16/2014 2259   BILITOT 0.3 03/22/2013 0320   GFRNONAA >60 12/16/2014 2259   GFRNONAA >60 03/22/2013 0320   GFRAA >60 12/16/2014 2259   GFRAA  >60 03/22/2013 0320   Lipase     Component Value Date/Time   LIPASE 23 12/16/2014 2259       Studies/Results: US Abdomen Limited Ruq  12/17/2014   CLINICAL DATA:  Acute onset of right upper quadrant abdominal pain. Initial encounter.  EXAM: US ABDOMEN LIMITED - RIGHT UPPER QUADRANT  COMPARISON:  None.  FINDINGS: Gallbladder:  Diffuse gallbladder wall thickening is noted, measuring up to 4 mm, with a small amount of pericholecystic fluid. Stones are seen layering dependently within the gallbladder, measuring up to 1.1 cm in size. Evaluation for ultrasonographic Murphy's sign is limited, as the patient has been given pain medication.  Common bile duct:  Diameter: 0.3 cm, within normal limits in caliber.  Liver:  No focal lesion identified. Within normal limits in parenchymal echogenicity.  IMPRESSION: Diffuse gallbladder wall thickening, with a small amount of pericholecystic fluid. Underlying cholelithiasis noted. This raises concern for acute cholecystitis. Evaluation for ultrasonographic Murphy's sign is limited, as the patient has been given pain medication.   Electronically Signed   By: Roanna Raider M.D.   On: 12/17/2014 01:29    Anti-infectives: Anti-infectives  Start     Dose/Rate Route Frequency Ordered Stop   12/17/14 0800  cefTRIAXone (ROCEPHIN) 2 g in dextrose 5 % 50 mL IVPB     2 g 100 mL/hr over 30 Minutes Intravenous Every 24 hours 12/17/14 0729         Assessment/Plan Cholecystitis with cholelithiasis -Plan for OR tomorrow unless emergencies come in -Clears, NPO MN, IVF, pain control, antiemetics, antibiotics (Day 1 ceftriaxone) -Ambulate and IS -SCD's, hold on dvt proph for now -Labs in AM  HTN (had it during pregnancy) -PRN hydralazine -Scheduled labetalol  bid -Ordered oral home meds, okay to take with sip of water before surgery        Nonie Hoyer 12/17/2014, 9:18 AM Pager: [phone]

## 2014-12-17 NOTE — ED Provider Notes (Signed)
CSN: [id number]   Arrival date & time 12/16/14 2233  History  This chart was scribed for  Shon Baton, MD by Bethel Born, ED Scribe. This patient was seen in room A05C/A05C and the patient's care was started at 1:31 AM.  Chief Complaint  Patient presents with  . Abdominal Pain  . Nausea    HPI The history is provided by the patient. No language interpreter was used.   Kelli Wells is a 29 y.o. female who presents to the Emergency Department complaining of constant and recurrent RUQ pain with onset 3 hours ago after eating a salad. The pain is non-radiating and she rates it 8/10 in severity at worst (6/10 at present). Zantac QID typically keeps her pain free but has provided insufficient pain relief today. Associated symptoms include chills and nausea. Pt denies vomiting, diarrhea, and fever. She has known gallstones (diagnosed by [place] in her PCPs office 2 months ago) but has not been able to f/u with surgery because of work.   Past Medical History  Diagnosis Date  . Hypertension   . Pregnancy induced hypertension   . Vaginal Pap smear, abnormal     LEEP, normal since  . Infection     UTI  . Anxiety   . Depression   . Asthma   . GERD (gastroesophageal reflux disease)     Past Surgical History  Procedure Laterality Date  . Lymph gland excision    . Leep      Family History  Problem Relation Age of Onset  . Hypertension Mother   . Cancer Mother     rectal  . Hypertension Father   . Hypertension Maternal Grandmother   . Cancer Maternal Grandmother     breast  . Hypertension Maternal Grandfather   . Kidney disease Paternal Grandmother   . Heart disease Paternal Grandfather   . Hearing loss Neg Hx     History  Substance Use Topics  . Smoking status: Former Games developer  . Smokeless tobacco: Never Used     Comment: quit June 2015  . Alcohol Use: No     Review of Systems  Constitutional: Negative for fever.  Respiratory: Negative for chest tightness and shortness  of breath.   Cardiovascular: Negative for chest pain.  Gastrointestinal: Positive for nausea and abdominal pain. Negative for vomiting.  Genitourinary: Negative for dysuria.  Musculoskeletal: Negative for back pain.  Neurological: Negative for headaches.  All other systems reviewed and are negative.    Home Medications   Prior to Admission medications   Medication Sig Start Date End Date Taking? Authorizing Provider  albuterol (PROVENTIL HFA;VENTOLIN HFA) 108 (90 BASE) MCG/ACT inhaler Inhale 2 puffs into the lungs every 6 (six) hours as needed for wheezing or shortness of breath.    Historical Provider, MD  FLUoxetine (PROZAC) 20 MG capsule Take 20 mg by mouth daily.    Historical Provider, MD  labetalol (NORMODYNE) 200 MG tablet Take 400 mg by mouth 2 (two) times daily. 11/22/13   Lisa A Leftwich-Kirby, CNM  labetalol (NORMODYNE) 300 MG tablet Take 2 tablets (600 mg total) by mouth 2 (two) times daily. 05/23/14   Philip Aspen, DO  NIFEdipine (PROCARDIA XL) 30 MG 24 hr tablet Take 1 tablet (30 mg total) by mouth daily. 05/22/14   Levi Aland, MD  NIFEdipine (PROCARDIA-XL/ADALAT CC) 30 MG 24 hr tablet Take 1 tablet (30 mg total) by mouth daily. 05/23/14   Philip Aspen, DO  Prenatal Vit-Fe Fumarate-FA (PRENATAL MULTIVITAMIN) TABS  tablet Take 1 tablet by mouth daily at 12 noon.    Historical Provider, MD    Allergies  Sulfa antibiotics  Triage Vitals: BP 194/112 mmHg  Pulse 70  Temp(Src) 97.3 F (36.3 C) (Oral)  Resp 16  Ht 5\' 4"  (1.626 m)  SpO2 100%  LMP  (LMP Unknown)  Breastfeeding? Yes  Physical Exam  Constitutional: She is oriented to person, place, and time. She appears well-developed and well-nourished. No distress.  HENT:  Head: Normocephalic and atraumatic.  Cardiovascular: Normal rate, regular rhythm and normal heart sounds.   No murmur heard. Pulmonary/Chest: Effort normal and breath sounds normal. No respiratory distress. She has no wheezes.  Abdominal: Soft.  Bowel sounds are normal. There is tenderness. There is no rebound and no guarding.  Epigastric and right upper quadrant tenderness to palpation without rebound or guarding, positive Murphy's  Neurological: She is alert and oriented to person, place, and time.  Skin: Skin is warm and dry.  Psychiatric: She has a normal mood and affect.  Nursing note and vitals reviewed.   ED Course  Procedures   DIAGNOSTIC STUDIES: Oxygen Saturation is 100% on RA, normal by my interpretation.    COORDINATION OF CARE: 1:36 AM Discussed treatment plan which includes lab work, abdominal US, Dilaudid, morphine, and Zofran with pt at bedside and pt agreed to plan.  Labs Review-  Labs Reviewed  COMPREHENSIVE METABOLIC PANEL - Abnormal; Notable for the following:    CO2 20 (*)    All other components within normal limits  CBC - Abnormal; Notable for the following:    RBC 5.16 (*)    Hemoglobin 15.6 (*)    All other components within normal limits  URINALYSIS, ROUTINE W REFLEX MICROSCOPIC (NOT AT [facility]) - Abnormal; Notable for the following:    APPearance HAZY (*)    All other components within normal limits  LIPASE, BLOOD  I-STAT BETA HCG BLOOD, ED ([facility], [facility], [facility] ONLY)    Imaging Review US Abdomen Limited Ruq  12/17/2014   CLINICAL DATA:  Acute onset of right upper quadrant abdominal pain. Initial encounter.  EXAM: US ABDOMEN LIMITED - RIGHT UPPER QUADRANT  COMPARISON:  None.  FINDINGS: Gallbladder:  Diffuse gallbladder wall thickening is noted, measuring up to 4 mm, with a small amount of pericholecystic fluid. Stones are seen layering dependently within the gallbladder, measuring up to 1.1 cm in size. Evaluation for ultrasonographic Murphy's sign is limited, as the patient has been given pain medication.  Common bile duct:  Diameter: 0.3 cm, within normal limits in caliber.  Liver:  No focal lesion identified. Within normal limits in parenchymal echogenicity.  IMPRESSION: Diffuse gallbladder wall thickening,  with a small amount of pericholecystic fluid. Underlying cholelithiasis noted. This raises concern for acute cholecystitis. Evaluation for ultrasonographic Murphy's sign is limited, as the patient has been given pain medication.   Electronically Signed   By: Roanna Raider M.D.   On: 12/17/2014 01:29    EKG Interpretation None      MDM   Final diagnoses:  RUQ pain  Cholecystitis    Patient presents with right upper quadrant pain. Consistent with prior gallstones. Nontoxic. Afebrile. Tenderness palpation the right upper quadrant. No signs of peritonitis. Patient is hypertensive. Patient was given pain and nausea medication. Labs are reassuring including LFTs. Right upper quadrant ultrasound shows gallbladder wall thickening with pericholecystic fluid consistent with possible acute cholecystitis. Given ultrasound findings, general surgery consulted. Expect admission.  I personally performed the services described in  this documentation, which was scribed in my presence. The recorded information has been reviewed and is accurate.     Shon Baton, MD 12/17/14 [id number]

## 2014-12-18 ENCOUNTER — Observation Stay (HOSPITAL_COMMUNITY): Payer: Medicaid Other

## 2014-12-18 ENCOUNTER — Encounter (HOSPITAL_COMMUNITY)
Admission: EM | Disposition: A | Discharge status: Home / Self Care | Payer: Self-pay | Source: Home / Self Care | Attending: Emergency Medicine

## 2014-12-18 ENCOUNTER — Observation Stay (HOSPITAL_COMMUNITY): Payer: Medicaid Other | Admitting: Certified Registered Nurse Anesthetist

## 2014-12-18 ENCOUNTER — Encounter (HOSPITAL_COMMUNITY): Payer: Self-pay | Admitting: Certified Registered Nurse Anesthetist

## 2014-12-18 DIAGNOSIS — K8010 Calculus of gallbladder with chronic cholecystitis without obstruction: Secondary | ICD-10-CM | POA: Diagnosis not present

## 2014-12-18 DIAGNOSIS — J45909 Unspecified asthma, uncomplicated: Secondary | ICD-10-CM | POA: Diagnosis not present

## 2014-12-18 DIAGNOSIS — F329 Major depressive disorder, single episode, unspecified: Secondary | ICD-10-CM | POA: Diagnosis not present

## 2014-12-18 DIAGNOSIS — F419 Anxiety disorder, unspecified: Secondary | ICD-10-CM | POA: Diagnosis not present

## 2014-12-18 HISTORY — PX: CHOLECYSTECTOMY: SHX55

## 2014-12-18 LAB — COMPREHENSIVE METABOLIC PANEL
ALT: 13 U/L — ABNORMAL LOW (ref 14–54)
ANION GAP: 6 (ref 5–15)
AST: 14 U/L — AB (ref 15–41)
Albumin: 3.4 g/dL — ABNORMAL LOW (ref 3.5–5.0)
Alkaline Phosphatase: 88 U/L (ref 38–126)
BUN: 5 mg/dL — ABNORMAL LOW (ref 6–20)
CALCIUM: 9.1 mg/dL (ref 8.9–10.3)
CO2: 22 mmol/L (ref 22–32)
CREATININE: 0.87 mg/dL (ref 0.44–1.00)
Chloride: 110 mmol/L (ref 101–111)
GFR calc Af Amer: >60 mL/min (ref >60)
Glucose, Bld: 95 mg/dL (ref 65–99)
POTASSIUM: 4.4 mmol/L (ref 3.5–5.1)
SODIUM: 138 mmol/L (ref 135–145)
TOTAL PROTEIN: 6.3 g/dL — AB (ref 6.5–8.1)
Total Bilirubin: 0.4 mg/dL (ref 0.3–1.2)

## 2014-12-18 LAB — SURGICAL PCR SCREEN
MRSA, PCR: NEGATIVE
STAPHYLOCOCCUS AUREUS: NEGATIVE

## 2014-12-18 LAB — CBC
HEMATOCRIT: 41.4 % (ref 36.0–46.0)
HEMOGLOBIN: 13.8 g/dL (ref 12.0–15.0)
MCH: 29.1 pg (ref 26.0–34.0)
MCHC: 33.3 g/dL (ref 30.0–36.0)
MCV: 87.2 fL (ref 78.0–100.0)
PLATELETS: 234 10*3/uL (ref 150–400)
RBC: 4.75 MIL/uL (ref 3.87–5.11)
RDW: 13.5 % (ref 11.5–15.5)
WBC: 4.7 10*3/uL (ref 4.0–10.5)

## 2014-12-18 SURGERY — LAPAROSCOPIC CHOLECYSTECTOMY WITH INTRAOPERATIVE CHOLANGIOGRAM
Anesthesia: General | Site: Abdomen

## 2014-12-18 MED ORDER — PROMETHAZINE HCL 25 MG/ML IJ SOLN: 6.2500 mg | INTRAMUSCULAR | Status: DC | PRN

## 2014-12-18 MED ORDER — ONDANSETRON HCL 4 MG/2ML IJ SOLN
INTRAMUSCULAR | Status: AC
  Filled 2014-12-18: qty 2

## 2014-12-18 MED ORDER — PROPOFOL 10 MG/ML IV BOLUS
INTRAVENOUS | Status: AC
  Filled 2014-12-18: qty 20

## 2014-12-18 MED ORDER — FENTANYL CITRATE (PF) 250 MCG/5ML IJ SOLN
INTRAMUSCULAR | Status: AC
  Filled 2014-12-18: qty 5

## 2014-12-18 MED ORDER — PROPOFOL 10 MG/ML IV BOLUS
INTRAVENOUS | Status: DC | PRN
  Administered 2014-12-18: 200 mg via INTRAVENOUS

## 2014-12-18 MED ORDER — FENTANYL CITRATE (PF) 100 MCG/2ML IJ SOLN
INTRAMUSCULAR | Status: DC | PRN
  Administered 2014-12-18 (×2): 50 ug via INTRAVENOUS
  Administered 2014-12-18: 150 ug via INTRAVENOUS

## 2014-12-18 MED ORDER — ROCURONIUM BROMIDE 50 MG/5ML IV SOLN
INTRAVENOUS | Status: AC
  Filled 2014-12-18: qty 1

## 2014-12-18 MED ORDER — BUPIVACAINE HCL (PF) 0.25 % IJ SOLN
INTRAMUSCULAR | Status: AC
  Filled 2014-12-18: qty 30

## 2014-12-18 MED ORDER — LIDOCAINE HCL (CARDIAC) 20 MG/ML IV SOLN
INTRAVENOUS | Status: AC
  Filled 2014-12-18: qty 5

## 2014-12-18 MED ORDER — 0.9 % SODIUM CHLORIDE (POUR BTL) OPTIME
TOPICAL | Status: DC | PRN
  Administered 2014-12-18: 1000 mL

## 2014-12-18 MED ORDER — ROCURONIUM BROMIDE 100 MG/10ML IV SOLN
INTRAVENOUS | Status: DC | PRN
  Administered 2014-12-18: 40 mg via INTRAVENOUS

## 2014-12-18 MED ORDER — GLYCOPYRROLATE 0.2 MG/ML IJ SOLN
INTRAMUSCULAR | Status: DC | PRN
  Administered 2014-12-18: 0.6 mg via INTRAVENOUS

## 2014-12-18 MED ORDER — OXYCODONE HCL 5 MG/5ML PO SOLN: 5.0000 mg | Freq: Once | ORAL | Status: DC | PRN

## 2014-12-18 MED ORDER — ARTIFICIAL TEARS OP OINT
TOPICAL_OINTMENT | OPHTHALMIC | Status: DC | PRN
  Administered 2014-12-18: 1 via OPHTHALMIC

## 2014-12-18 MED ORDER — NEOSTIGMINE METHYLSULFATE 10 MG/10ML IV SOLN
INTRAVENOUS | Status: DC | PRN
  Administered 2014-12-18: 4 mg via INTRAVENOUS

## 2014-12-18 MED ORDER — CEFAZOLIN SODIUM-DEXTROSE 2-3 GM-% IV SOLR
2.0000 g | Freq: Once | INTRAVENOUS | Status: AC
  Administered 2014-12-18: 2 g via INTRAVENOUS
  Filled 2014-12-18: qty 50

## 2014-12-18 MED ORDER — ARTIFICIAL TEARS OP OINT
TOPICAL_OINTMENT | OPHTHALMIC | Status: AC
  Filled 2014-12-18: qty 3.5

## 2014-12-18 MED ORDER — LACTATED RINGERS IV SOLN
INTRAVENOUS | Status: DC | PRN
  Administered 2014-12-18: 07:00:00 via INTRAVENOUS

## 2014-12-18 MED ORDER — LABETALOL HCL 5 MG/ML IV SOLN
INTRAVENOUS | Status: AC
  Filled 2014-12-18: qty 4

## 2014-12-18 MED ORDER — LABETALOL HCL 5 MG/ML IV SOLN
INTRAVENOUS | Status: DC | PRN
  Administered 2014-12-18: 5 mg via INTRAVENOUS

## 2014-12-18 MED ORDER — BUPIVACAINE-EPINEPHRINE 0.25% -1:200000 IJ SOLN
INTRAMUSCULAR | Status: DC | PRN
  Administered 2014-12-18: 20 mL

## 2014-12-18 MED ORDER — OXYCODONE-ACETAMINOPHEN 5-325 MG PO TABS
1.0000 | ORAL_TABLET | ORAL | Status: DC | PRN
  Administered 2014-12-18: 1 via ORAL
  Administered 2014-12-19: 2 via ORAL
  Filled 2014-12-18: qty 2
  Filled 2014-12-18: qty 1

## 2014-12-18 MED ORDER — CEFAZOLIN SODIUM-DEXTROSE 2-3 GM-% IV SOLR
INTRAVENOUS | Status: AC
  Filled 2014-12-18: qty 50

## 2014-12-18 MED ORDER — OXYCODONE HCL 5 MG PO TABS: 5.0000 mg | ORAL_TABLET | Freq: Once | ORAL | Status: DC | PRN

## 2014-12-18 MED ORDER — HYDROMORPHONE HCL 1 MG/ML IJ SOLN
0.2500 mg | INTRAMUSCULAR | Status: DC | PRN
  Administered 2014-12-18 (×2): 0.5 mg via INTRAVENOUS

## 2014-12-18 MED ORDER — ONDANSETRON HCL 4 MG/2ML IJ SOLN
INTRAMUSCULAR | Status: DC | PRN
  Administered 2014-12-18: 4 mg via INTRAVENOUS

## 2014-12-18 MED ORDER — MIDAZOLAM HCL 5 MG/5ML IJ SOLN
INTRAMUSCULAR | Status: DC | PRN
  Administered 2014-12-18: 2 mg via INTRAVENOUS

## 2014-12-18 MED ORDER — HYDROMORPHONE HCL 1 MG/ML IJ SOLN
INTRAMUSCULAR | Status: AC
  Filled 2014-12-18: qty 1

## 2014-12-18 MED ORDER — NEOSTIGMINE METHYLSULFATE 10 MG/10ML IV SOLN
INTRAVENOUS | Status: AC
  Filled 2014-12-18: qty 1

## 2014-12-18 MED ORDER — LIDOCAINE HCL (CARDIAC) 20 MG/ML IV SOLN
INTRAVENOUS | Status: DC | PRN
  Administered 2014-12-18: 40 mg via INTRAVENOUS

## 2014-12-18 MED ORDER — SODIUM CHLORIDE 0.9 % IV SOLN
INTRAVENOUS | Status: DC | PRN
  Administered 2014-12-18: 25 mL

## 2014-12-18 MED ORDER — SUCCINYLCHOLINE CHLORIDE 20 MG/ML IJ SOLN
INTRAMUSCULAR | Status: AC
  Filled 2014-12-18: qty 1

## 2014-12-18 MED ORDER — MIDAZOLAM HCL 2 MG/2ML IJ SOLN
INTRAMUSCULAR | Status: AC
  Filled 2014-12-18: qty 4

## 2014-12-18 MED ORDER — GLYCOPYRROLATE 0.2 MG/ML IJ SOLN
INTRAMUSCULAR | Status: AC
  Filled 2014-12-18: qty 3

## 2014-12-18 MED ORDER — KETOROLAC TROMETHAMINE 30 MG/ML IJ SOLN: 30.0000 mg | Freq: Once | INTRAMUSCULAR | Status: DC | PRN

## 2014-12-18 MED ORDER — SODIUM CHLORIDE 0.9 % IR SOLN
Status: DC | PRN
  Administered 2014-12-18: 1000 mL

## 2014-12-18 SURGICAL SUPPLY — 33 items
APPLIER CLIP 5 13 M/L LIGAMAX5 (MISCELLANEOUS) ×3
BLADE SURG ROTATE 9660 (MISCELLANEOUS) IMPLANT
CANISTER SUCTION 2500CC (MISCELLANEOUS) ×3 IMPLANT
CATH REDDICK CHOLANGI 4FR 50CM (CATHETERS) ×3 IMPLANT
CHLORAPREP W/TINT 26ML (MISCELLANEOUS) ×3 IMPLANT
CLIP APPLIE 5 13 M/L LIGAMAX5 (MISCELLANEOUS) ×1 IMPLANT
COVER MAYO STAND STRL (DRAPES) ×3 IMPLANT
COVER SURGICAL LIGHT HANDLE (MISCELLANEOUS) ×3 IMPLANT
DECANTER SPIKE VIAL GLASS SM (MISCELLANEOUS) ×3 IMPLANT
DRAPE C-ARM 42X72 X-RAY (DRAPES) ×3 IMPLANT
ELECT REM PT RETURN 9FT ADLT (ELECTROSURGICAL) ×3
ELECTRODE REM PT RTRN 9FT ADLT (ELECTROSURGICAL) ×1 IMPLANT
GLOVE BIO SURGEON STRL SZ7.5 (GLOVE) ×3 IMPLANT
GOWN STRL REUS W/ TWL LRG LVL3 (GOWN DISPOSABLE) ×3 IMPLANT
GOWN STRL REUS W/TWL LRG LVL3 (GOWN DISPOSABLE) ×6
IV CATH 14GX2 1/4 (CATHETERS) ×3 IMPLANT
KIT BASIN OR (CUSTOM PROCEDURE TRAY) ×3 IMPLANT
KIT ROOM TURNOVER OR (KITS) ×3 IMPLANT
LIQUID BAND (GAUZE/BANDAGES/DRESSINGS) ×3 IMPLANT
NS IRRIG 1000ML POUR BTL (IV SOLUTION) ×3 IMPLANT
PAD ARMBOARD 7.5X6 YLW CONV (MISCELLANEOUS) ×3 IMPLANT
POUCH SPECIMEN RETRIEVAL 10MM (ENDOMECHANICALS) ×3 IMPLANT
SCISSORS LAP 5X35 DISP (ENDOMECHANICALS) ×3 IMPLANT
SET IRRIG TUBING LAPAROSCOPIC (IRRIGATION / IRRIGATOR) ×3 IMPLANT
SLEEVE ENDOPATH XCEL 5M (ENDOMECHANICALS) ×6 IMPLANT
SPECIMEN JAR SMALL (MISCELLANEOUS) ×3 IMPLANT
SUT MNCRL AB 4-0 PS2 18 (SUTURE) ×3 IMPLANT
TOWEL OR 17X24 6PK STRL BLUE (TOWEL DISPOSABLE) ×3 IMPLANT
TOWEL OR 17X26 10 PK STRL BLUE (TOWEL DISPOSABLE) ×3 IMPLANT
TRAY LAPAROSCOPIC MC (CUSTOM PROCEDURE TRAY) ×3 IMPLANT
TROCAR XCEL BLUNT TIP 100MML (ENDOMECHANICALS) ×3 IMPLANT
TROCAR XCEL NON-BLD 5MMX100MML (ENDOMECHANICALS) ×3 IMPLANT
TUBING INSUFFLATION (TUBING) ×3 IMPLANT

## 2014-12-18 NOTE — Anesthesia Preprocedure Evaluation (Signed)
Anesthesia Evaluation  Patient identified by MRN, date of birth, ID band Patient awake    Reviewed: Allergy & Precautions, NPO status , Patient's Chart, lab work & pertinent test results, reviewed documented beta blocker date and time   Airway Mallampati: II  TM Distance: >3 FB Neck ROM: Full    Dental   Pulmonary asthma , former smoker,  breath sounds clear to auscultation        Cardiovascular hypertension, Pt. on medications and Pt. on home beta blockers Rhythm:Regular Rate:Normal     Neuro/Psych negative neurological ROS     GI/Hepatic Neg liver ROS, GERD-  ,  Endo/Other  negative endocrine ROS  Renal/GU negative Renal ROS     Musculoskeletal   Abdominal   Peds  Hematology negative hematology ROS (+)   Anesthesia Other Findings   Reproductive/Obstetrics                             Anesthesia Physical Anesthesia Plan  ASA: II  Anesthesia Plan: General   Post-op Pain Management:    Induction: Intravenous  Airway Management Planned: Oral ETT  Additional Equipment:   Intra-op Plan:   Post-operative Plan: Extubation in OR  Informed Consent: I have reviewed the patients History and Physical, chart, labs and discussed the procedure including the risks, benefits and alternatives for the proposed anesthesia with the patient or authorized representative who has indicated his/her understanding and acceptance.   Dental advisory given  Plan Discussed with: CRNA  Anesthesia Plan Comments:         Anesthesia Quick Evaluation

## 2014-12-18 NOTE — Anesthesia Postprocedure Evaluation (Signed)
  Anesthesia Post-op Note  Patient: Kelli Wells  Procedure(s) Performed: Procedure(s): LAPAROSCOPIC CHOLECYSTECTOMY WITH INTRAOPERATIVE CHOLANGIOGRAM (N/A)  Patient Location: PACU  Anesthesia Type:General  Level of Consciousness: awake and alert   Airway and Oxygen Therapy: Patient Spontanous Breathing  Post-op Pain: mild  Post-op Assessment: Post-op Vital signs reviewed              Post-op Vital Signs: Reviewed  Last Vitals:  Filed Vitals:   12/18/14 1007  BP: 146/87  Pulse: 74  Temp: 36.8 C  Resp: 16    Complications: No apparent anesthesia complications

## 2014-12-18 NOTE — Anesthesia Procedure Notes (Signed)
Procedure Name: Intubation Date/Time: 12/18/2014 8:01 AM Performed by: Bishop Limbo Pre-anesthesia Checklist: Patient identified, Timeout performed, Emergency Drugs available, Suction available and Patient being monitored Patient Re-evaluated:Patient Re-evaluated prior to inductionOxygen Delivery Method: Circle system utilized Preoxygenation: Pre-oxygenation with 100% oxygen Intubation Type: IV induction Ventilation: Mask ventilation without difficulty Laryngoscope Size: Mac and 3 Grade View: Grade I Tube type: Oral Tube size: 7.5 mm Number of attempts: 1 Airway Equipment and Method: Stylet Placement Confirmation: ETT inserted through vocal cords under direct vision,  positive ETCO2,  CO2 detector and breath sounds checked- equal and bilateral Secured at: 21 cm Tube secured with: Tape Dental Injury: Teeth and Oropharynx as per pre-operative assessment

## 2014-12-18 NOTE — Op Note (Signed)
12/17/2014 - 12/18/2014  9:06 AM  PATIENT:  Kelli Wells  29 y.o. female  PRE-OPERATIVE DIAGNOSIS:  cholecystitis  POST-OPERATIVE DIAGNOSIS:  cholecystitis  PROCEDURE:  Procedure(s): LAPAROSCOPIC CHOLECYSTECTOMY WITH INTRAOPERATIVE CHOLANGIOGRAM (N/A)  SURGEON:  Surgeon(s) and Role:    * Griselda Miner, MD - Primary  PHYSICIAN ASSISTANT:   ASSISTANTS: none   ANESTHESIA:   general  EBL:     BLOOD ADMINISTERED:none  DRAINS: none   LOCAL MEDICATIONS USED:  MARCAINE     SPECIMEN:  Source of Specimen:  gallbladder  DISPOSITION OF SPECIMEN:  PATHOLOGY  COUNTS:  YES  TOURNIQUET:  * No tourniquets in log *  DICTATION: .Dragon Dictation   Procedure: After informed consent was obtained the patient was brought to the operating room and placed in the supine position on the operating room table. After adequate induction of general anesthesia the patient's abdomen was prepped with ChloraPrep allowed to dry and draped in usual sterile manner. The area below the umbilicus was infiltrated with quarter percent  Marcaine. A small incision was made with a 15 blade knife. The incision was carried down through the subcutaneous tissue bluntly with a hemostat and [employer] retractors. The linea alba was identified. The linea alba was incised with a 15 blade knife and each side was grasped with Coker clamps. The preperitoneal space was then probed with a hemostat until the peritoneum was opened and access was gained to the abdominal cavity. A 0 Vicryl pursestring stitch was placed in the fascia surrounding the opening. A Hassan cannula was then placed through the opening and anchored in place with the previously placed Vicryl purse string stitch. The abdomen was insufflated with carbon dioxide without difficulty. A laparoscope was inserted through the [facility] cannula in the right upper quadrant was inspected. Next the epigastric region was infiltrated with % Marcaine. A small incision was made with a 15  blade knife. A 5 mm port was placed bluntly through this incision into the abdominal cavity under direct vision. Next 2 sites were chosen laterally on the right side of the abdomen for placement of 5 mm ports. Each of these areas was infiltrated with quarter percent Marcaine. Small stab incisions were made with a 15 blade knife. 5 mm ports were then placed bluntly through these incisions into the abdominal cavity under direct vision without difficulty. A blunt grasper was placed through the lateralmost 5 mm port and used to grasp the dome of the gallbladder and elevated anteriorly and superiorly. Another blunt grasper was placed through the other 5 mm port and used to retract the body and neck of the gallbladder. A dissector was placed through the epigastric port and using the electrocautery the peritoneal reflection at the gallbladder neck was opened. Blunt dissection was then carried out in this area until the gallbladder neck-cystic duct junction was readily identified and a good window was created. A single clip was placed on the gallbladder neck. A small  ductotomy was made just below the clip with laparoscopic scissors. A 14-gauge Angiocath was then placed through the anterior abdominal wall under direct vision. A Reddick cholangiogram catheter was then placed through the Angiocath and flushed. The catheter was then placed in the cystic duct and anchored in place with a clip. A cholangiogram was obtained that showed no filling defects good emptying into the duodenum an adequate length on the cystic duct. The anchoring clip and catheters were then removed from the patient. 3 clips were placed proximally on the cystic duct and the  duct was divided between the 2 sets of clips. Posterior to this the cystic artery was identified and again dissected bluntly in a circumferential manner until a good window  was created. 2 clips were placed proximally and one distally on the artery and the artery was divided between  the 2 sets of clips. Next a laparoscopic hook cautery device was used to separate the gallbladder from the liver bed. Prior to completely detaching the gallbladder from the liver bed the liver bed was inspected and several small bleeding points were coagulated with the electrocautery until the area was completely hemostatic. The gallbladder was then detached the rest of it from the liver bed without difficulty. A laparoscopic bag was inserted through the hassan port. The laparoscope was moved to the epigastric port. The gallbladder was placed within the bag and the bag was sealed.  The bag with the gallbladder was then removed with the [facility] cannula through the infraumbilical port without difficulty. The fascial defect was then closed with the previously placed Vicryl pursestring stitch as well as with another figure-of-eight 0 Vicryl stitch. The liver bed was inspected again and found to be hemostatic. The abdomen was irrigated with copious amounts of saline until the effluent was clear. The ports were then removed under direct vision without difficulty and were found to be hemostatic. The gas was allowed to escape. The skin incisions were all closed with interrupted 4-0 Monocryl subcuticular stitches. Dermabond dressings were applied. The patient tolerated the procedure well. At the end of the case all needle sponge and instrument counts were correct. The patient was then awakened and taken to recovery in stable condition  PLAN OF CARE: Admit to inpatient   PATIENT DISPOSITION:  PACU - hemodynamically stable.   Delay start of Pharmacological VTE agent (>24hrs) due to surgical blood loss or risk of bleeding: no

## 2014-12-18 NOTE — Transfer of Care (Signed)
Immediate Anesthesia Transfer of Care Note  Patient: Kelli Wells  Procedure(s) Performed: Procedure(s): LAPAROSCOPIC CHOLECYSTECTOMY WITH INTRAOPERATIVE CHOLANGIOGRAM (N/A)  Patient Location: PACU  Anesthesia Type:General  Level of Consciousness: awake, alert , oriented and patient cooperative  Airway & Oxygen Therapy: Patient Spontanous Breathing and Patient connected to nasal cannula oxygen  Post-op Assessment: Report given to RN, Post -op Vital signs reviewed and stable and Patient moving all extremities  Post vital signs: Reviewed and stable  Last Vitals:  Filed Vitals:   12/18/14 0624  BP: 162/97  Pulse: 74  Temp: 36.6 C  Resp: 16    Complications: No apparent anesthesia complications

## 2014-12-19 ENCOUNTER — Encounter (HOSPITAL_COMMUNITY): Payer: Self-pay | Admitting: General Surgery

## 2014-12-19 MED ORDER — OXYCODONE-ACETAMINOPHEN 5-325 MG PO TABS: 1.0000 | ORAL_TABLET | Freq: Four times a day (QID) | ORAL | Status: DC | PRN

## 2014-12-19 NOTE — Progress Notes (Signed)
Patient discharged to home with instructions. 

## 2014-12-19 NOTE — Discharge Summary (Signed)
[facility] Discharge Summary   Patient ID: Kelli Wells MRN: [id number] DOB/AGE: April 08, 1986 29 y.o.  Admit date: 12/17/2014 Discharge date: 12/19/2014  Admitting Diagnosis: Cholecystitis with cholelithiasis  Discharge Diagnosis Patient Active Problem List   Diagnosis Date Noted  . Cholecystitis with cholelithiasis 12/17/2014  . Pregnancy 05/22/2014  . Postpartum state 05/12/2014  . Chronic hypertension in pregnancy   . [redacted] weeks gestation of pregnancy   . HTN in pregnancy, chronic 04/06/2014  . Non-reactive NST (non-stress test)   . [redacted] weeks gestation of pregnancy     Consultants None  Imaging: Dg Cholangiogram Operative  12/18/2014   CLINICAL DATA:  Gallstones.  Laparoscopic cholecystectomy.  EXAM: INTRAOPERATIVE CHOLANGIOGRAM  TECHNIQUE: Cholangiographic images from the C-arm fluoroscopic device were submitted for interpretation post-operatively. Please see the procedural report for the amount of contrast and the fluoroscopy time utilized.  COMPARISON:  Ultrasound 12/17/2014.  FLUOROSCOPY TIME:  FLUOROSCOPY TIME 12 seconds.  FINDINGS: Injection of the cystic duct remnant demonstrates opacification of the common bile duct with drainage into the duodenum. There is partial opacification of the intrahepatic biliary system which is normal in caliber. No evidence of retained stone, duct obstruction or extravasation.  IMPRESSION: Normal intraoperative cholangiogram. No evidence of retained calculus.   Electronically Signed   By: Carey Bullocks M.D.   On: 12/18/2014 09:34    Procedures Dr. Carolynne Edouard (12/18/14) - Laparoscopic Cholecystectomy with negative [facility]  Hospital Course:  29 y/o AA female who [facility] to [facility] with a 1 day history of abdominal pain. Pain is in the epigastric and RUQ. Pain is constant. It is associated with nausea but no vomiting. Denies fever. She has been having this pain for the last year but hasn't been this bad. She came to ER where u/s showed  gallstones and thickened gallbladder wall. lft's normal.  Patient was admitted and underwent procedure listed above.  Tolerated procedure well and was transferred to the floor.  Diet was advanced as tolerated.  On POD #1, the patient was voiding well, tolerating diet, ambulating well, pain well controlled, vital signs stable, incisions c/d/i and felt stable for discharge home.  Patient will follow up in our office in 3 weeks and knows to call with questions or concerns.  Physical Exam: General:  Alert, NAD, pleasant, comfortable Abd:  Soft, mild distension, mild tenderness, incisions C/D/I with dermabond in place      Medication List    TAKE these medications        acetaminophen 500 MG tablet  Commonly known as:  TYLENOL  Take 1,000 mg by mouth every 6 (six) hours as needed for mild pain.     albuterol 108 (90 BASE) MCG/ACT inhaler  Commonly known as:  PROVENTIL HFA;VENTOLIN HFA  Inhale 2 puffs into the lungs every 6 (six) hours as needed for wheezing or shortness of breath.     CAMILA 0.35 MG tablet  Generic drug:  norethindrone  Take 1 tablet by mouth daily.     FLUoxetine 20 MG capsule  Commonly known as:  PROZAC  Take 20 mg by mouth daily.     labetalol 300 MG tablet  Commonly known as:  NORMODYNE  Take 2 tablets (600 mg total) by mouth 2 (two) times daily.     oxyCODONE-acetaminophen 5-325 MG per tablet  Commonly known as:  PERCOCET/ROXICET  Take 1-2 tablets by mouth every 6 (six) hours as needed for moderate pain.     prenatal multivitamin Tabs tablet  Take 1 tablet  by mouth daily at 12 noon.     ranitidine 150 MG tablet  Commonly known as:  ZANTAC  Take 150 mg by mouth 4 (four) times daily.         Follow-up Information    Follow up with [facility] [place]. Go on 01/10/2015.   Why:  For post-operation check. Your appointment is at 2:15pm, please arrive at least 30 min before your appointment to complete your check in paperwork.  If you are unable to arrive 30 min  prior to your appointment time we may have to cancel or reschedule you   Contact information:   [address] [place] [address] [phone]      Signed: Nonie Hoyer, [facility] [phone]  12/19/2014, 8:38 AM

## 2014-12-19 NOTE — Discharge Instructions (Signed)

## 2015-06-28 ENCOUNTER — Other Ambulatory Visit: Payer: Self-pay | Admitting: Obstetrics and Gynecology

## 2015-06-29 LAB — CYTOLOGY - PAP

## 2015-12-11 ENCOUNTER — Ambulatory Visit: Payer: Medicaid Other | Attending: Internal Medicine

## 2015-12-14 ENCOUNTER — Encounter: Payer: Self-pay | Admitting: Family Medicine

## 2015-12-14 ENCOUNTER — Ambulatory Visit: Payer: Self-pay | Attending: Family Medicine | Admitting: Family Medicine

## 2015-12-14 DIAGNOSIS — Z79899 Other long term (current) drug therapy: Secondary | ICD-10-CM | POA: Insufficient documentation

## 2015-12-14 DIAGNOSIS — F419 Anxiety disorder, unspecified: Secondary | ICD-10-CM

## 2015-12-14 DIAGNOSIS — Z9049 Acquired absence of other specified parts of digestive tract: Secondary | ICD-10-CM | POA: Insufficient documentation

## 2015-12-14 DIAGNOSIS — I10 Essential (primary) hypertension: Secondary | ICD-10-CM

## 2015-12-14 DIAGNOSIS — F418 Other specified anxiety disorders: Secondary | ICD-10-CM

## 2015-12-14 DIAGNOSIS — Z9889 Other specified postprocedural states: Secondary | ICD-10-CM | POA: Insufficient documentation

## 2015-12-14 DIAGNOSIS — F329 Major depressive disorder, single episode, unspecified: Secondary | ICD-10-CM

## 2015-12-14 MED ORDER — LABETALOL HCL 300 MG PO TABS: 600.0000 mg | ORAL_TABLET | Freq: Two times a day (BID) | ORAL | 3 refills | Status: DC

## 2015-12-14 MED ORDER — BUSPIRONE HCL 15 MG PO TABS: 15.0000 mg | ORAL_TABLET | Freq: Two times a day (BID) | ORAL | 3 refills | Status: DC

## 2015-12-14 MED ORDER — NIFEDIPINE ER OSMOTIC RELEASE 30 MG PO TB24: 30.0000 mg | ORAL_TABLET | Freq: Every day | ORAL | 3 refills | Status: DC

## 2015-12-14 MED FILL — LABETALOL HCL 300 MG TABLET: 300 | 7 days supply | Qty: 30 | Fill #0

## 2015-12-14 MED FILL — busPIRone HCL 15 MG TABS: 15 | 30 days supply | Qty: 60 | Fill #0

## 2015-12-14 MED FILL — NIFEDIPINE ER 30 MG TABLET: 30 | 30 days supply | Qty: 30 | Fill #0

## 2015-12-14 NOTE — Progress Notes (Signed)
Subjective:  Patient ID: Kelli Wells, female    DOB: June 29, 1985  Age: 30 y.o. MRN: [id number]  CC: Hypertension; Anxiety; and Depression   HPI Kelli Wells is a 30 year old female with a history of hypertension, anxiety who comes into the clinic to establish care. She has been out of her antihypertensives for the last 1 week.  Anxiety and depression is managed on Prozac and BuSpar and she has also been out of her BuSpar but has been taking her Prozac. Currently nursing-has no additional concerns at this time.  Past Medical History:  Diagnosis Date  . Anxiety   . Asthma   . Depression   . GERD (gastroesophageal reflux disease)   . Hypertension   . Infection    UTI  . Pregnancy induced hypertension   . Vaginal Pap smear, abnormal    LEEP, normal since    Past Surgical History:  Procedure Laterality Date  . CHOLECYSTECTOMY N/A 12/18/2014   Procedure: LAPAROSCOPIC CHOLECYSTECTOMY WITH INTRAOPERATIVE CHOLANGIOGRAM;  Surgeon: Chevis Pretty III, MD;  Location: [facility] OR;  Service: General;  Laterality: N/A;  . LEEP    . LYMPH GLAND EXCISION      Allergies  Allergen Reactions  . Sulfa Antibiotics Anaphylaxis and Swelling     Outpatient Medications Prior to Visit  Medication Sig Dispense Refill  . acetaminophen (TYLENOL) 500 MG tablet Take 1,000 mg by mouth every 6 (six) hours as needed for mild pain.    Marland Kitchen albuterol (PROVENTIL HFA;VENTOLIN HFA) 108 (90 BASE) MCG/ACT inhaler Inhale 2 puffs into the lungs every 6 (six) hours as needed for wheezing or shortness of breath.    Marland Kitchen FLUoxetine (PROZAC) 20 MG capsule Take 20 mg by mouth daily.    . norethindrone (CAMILA) 0.35 MG tablet Take 1 tablet by mouth daily.    . Prenatal Vit-Fe Fumarate-FA (PRENATAL MULTIVITAMIN) TABS tablet Take 1 tablet by mouth daily at 12 noon.    . ranitidine (ZANTAC) 150 MG tablet Take 150 mg by mouth 4 (four) times daily.    Marland Kitchen labetalol (NORMODYNE) 300 MG tablet Take 2 tablets (600 mg total) by mouth 2  (two) times daily. (Patient not taking: Reported on 12/14/2015) 30 tablet 1  . oxyCODONE-acetaminophen (PERCOCET/ROXICET) 5-325 MG per tablet Take 1-2 tablets by mouth every 6 (six) hours as needed for moderate pain. (Patient not taking: Reported on 12/14/2015) 40 tablet 0   No facility-administered medications prior to visit.     ROS Review of Systems  Constitutional: Negative for activity change, appetite change and fatigue.  HENT: Negative for congestion, sinus pressure and sore throat.   Eyes: Negative for visual disturbance.  Respiratory: Negative for cough, chest tightness, shortness of breath and wheezing.   Cardiovascular: Negative for chest pain and palpitations.  Gastrointestinal: Negative for abdominal distention, abdominal pain and constipation.  Endocrine: Negative for polydipsia.  Genitourinary: Negative for dysuria and frequency.  Musculoskeletal: Negative for arthralgias and back pain.  Skin: Negative for rash.  Neurological: Negative for tremors, light-headedness and numbness.  Hematological: Does not bruise/bleed easily.  Psychiatric/Behavioral: Negative for agitation and behavioral problems.    Objective:  BP (!) 157/96 (BP Location: Right Arm, Patient Position: Sitting, Cuff Size: Large)   Pulse 86   Temp 98.5 F (36.9 C) (Oral)   Ht  (1.6 m)   Wt 179 lb 3.2 oz (81.3 kg)   SpO2 99%   BMI 31.74 kg/m   BP/Weight 12/14/2015 12/19/2014 12/17/2014  Systolic BP 157 164 -  Diastolic BP  96 94 -  Wt. (Lbs) 179.2 - 176.3  BMI 31.74 - 30.25      Physical Exam  Constitutional: She is oriented to person, place, and time. She appears well-developed and well-nourished.  Cardiovascular: Normal rate, normal heart sounds and intact distal pulses.   No murmur heard. Pulmonary/Chest: Effort normal and breath sounds normal. She has no wheezes. She has no rales. She exhibits no tenderness.  Abdominal: Soft. Bowel sounds are normal. She exhibits no distension and no mass.  There is no tenderness.  Musculoskeletal: Normal range of motion.  Neurological: She is alert and oriented to person, place, and time.     Assessment & Plan:   1. Essential hypertension Uncontrolled Refilled medications We'll reassess blood pressure at next visit Low-sodium, DASH diet, lifestyle management including weight loss  2. Anxiety and depression Old on BuSpar and Prozac Medications refilled   Meds ordered this encounter  Medications  . busPIRone (BUSPAR) 15 MG tablet    Sig: Take 1 tablet (15 mg total) by mouth 2 (two) times daily.    Dispense:  60 tablet    Refill:  3  . labetalol (NORMODYNE) 300 MG tablet    Sig: Take 2 tablets (600 mg total) by mouth 2 (two) times daily.    Dispense:  30 tablet    Refill:  3  . NIFEdipine (PROCARDIA-XL/ADALAT-CC/NIFEDICAL-XL) 30 MG 24 hr tablet    Sig: Take 1 tablet (30 mg total) by mouth daily.    Dispense:  30 tablet    Refill:  3    Follow-up: Return in about 2 weeks (around 12/28/2015) for Follow-up of hypertension.   Jaclyn Shaggy MD

## 2015-12-14 NOTE — Progress Notes (Signed)
Off of BP meds for one week breast feeding- infant 18 months

## 2015-12-14 NOTE — Patient Instructions (Signed)
Hypertension Hypertension, commonly called high blood pressure, is when the force of blood pumping through your arteries is too strong. Your arteries are the blood vessels that carry blood from your heart throughout your body. A blood pressure reading consists of a higher number over a lower number, such as 110/72. The higher number (systolic) is the pressure inside your arteries when your heart pumps. The lower number (diastolic) is the pressure inside your arteries when your heart relaxes. Ideally you want your blood pressure below 120/80. Hypertension forces your heart to work harder to pump blood. Your arteries may become narrow or stiff. Having untreated or uncontrolled hypertension can cause heart attack, stroke, kidney disease, and other problems. RISK FACTORS Some risk factors for high blood pressure are controllable. Others are not.  Risk factors you cannot control include:   Race. You may be at higher risk if you are African American.  Age. Risk increases with age.  Gender. Men are at higher risk than women before age 45 years. After age 65, women are at higher risk than men. Risk factors you can control include:  Not getting enough exercise or physical activity.  Being overweight.  Getting too much fat, sugar, calories, or salt in your diet.  Drinking too much alcohol. SIGNS AND SYMPTOMS Hypertension does not usually cause signs or symptoms. Extremely high blood pressure (hypertensive crisis) may cause headache, anxiety, shortness of breath, and nosebleed. DIAGNOSIS To check if you have hypertension, your health care provider will measure your blood pressure while you are seated, with your arm held at the level of your heart. It should be measured at least twice using the same arm. Certain conditions can cause a difference in blood pressure between your right and left arms. A blood pressure reading that is higher than normal on one occasion does not mean that you need treatment. If  it is not clear whether you have high blood pressure, you may be asked to return on a different day to have your blood pressure checked again. Or, you may be asked to monitor your blood pressure at home for 1 or more weeks. TREATMENT Treating high blood pressure includes making lifestyle changes and possibly taking medicine. Living a healthy lifestyle can help lower high blood pressure. You may need to change some of your habits. Lifestyle changes may include:  Following the DASH diet. This diet is high in fruits, vegetables, and whole grains. It is low in salt, red meat, and added sugars.  Keep your sodium intake below 2,300 mg per day.  Getting at least 30-45 minutes of aerobic exercise at least 4 times per week.  Losing weight if necessary.  Not smoking.  Limiting alcoholic beverages.  Learning ways to reduce stress. Your health care provider may prescribe medicine if lifestyle changes are not enough to get your blood pressure under control, and if one of the following is true:  You are 18-59 years of age and your systolic blood pressure is above 140.  You are 60 years of age or older, and your systolic blood pressure is above 150.  Your diastolic blood pressure is above 90.  You have diabetes, and your systolic blood pressure is over 140 or your diastolic blood pressure is over 90.  You have kidney disease and your blood pressure is above 140/90.  You have heart disease and your blood pressure is above 140/90. Your personal target blood pressure may vary depending on your medical conditions, your age, and other factors. HOME CARE INSTRUCTIONS    Have your blood pressure rechecked as directed by your health care provider.   Take medicines only as directed by your health care provider. Follow the directions carefully. Blood pressure medicines must be taken as prescribed. The medicine does not work as well when you skip doses. Skipping doses also puts you at risk for  problems.  Do not smoke.   Monitor your blood pressure at home as directed by your health care provider. SEEK MEDICAL CARE IF:   You think you are having a reaction to medicines taken.  You have recurrent headaches or feel dizzy.  You have swelling in your ankles.  You have trouble with your vision. SEEK IMMEDIATE MEDICAL CARE IF:  You develop a severe headache or confusion.  You have unusual weakness, numbness, or feel faint.  You have severe chest or abdominal pain.  You vomit repeatedly.  You have trouble breathing. MAKE SURE YOU:   Understand these instructions.  Will watch your condition.  Will get help right away if you are not doing well or get worse.   This information is not intended to replace advice given to you by your health care provider. Make sure you discuss any questions you have with your health care provider.   Document Released: 04/29/2005 Document Revised: 09/13/2014 Document Reviewed: 02/19/2013 Elsevier Interactive Patient Education 2016 Elsevier Inc.  

## 2015-12-29 MED FILL — LABETALOL HCL 300 MG TABLET: 300 | 7 days supply | Qty: 30 | Fill #1

## 2016-01-01 ENCOUNTER — Ambulatory Visit: Payer: Self-pay | Attending: Family Medicine | Admitting: Family Medicine

## 2016-01-01 ENCOUNTER — Encounter: Payer: Self-pay | Admitting: Family Medicine

## 2016-01-01 VITALS — BP 125/79 | HR 82 | Temp 98.2°F | Ht 64.0 in | Wt 174.0 lb

## 2016-01-01 DIAGNOSIS — H18822 Corneal disorder due to contact lens, left eye: Secondary | ICD-10-CM

## 2016-01-01 DIAGNOSIS — F418 Other specified anxiety disorders: Secondary | ICD-10-CM | POA: Insufficient documentation

## 2016-01-01 DIAGNOSIS — F329 Major depressive disorder, single episode, unspecified: Secondary | ICD-10-CM

## 2016-01-01 DIAGNOSIS — Z309 Encounter for contraceptive management, unspecified: Secondary | ICD-10-CM

## 2016-01-01 DIAGNOSIS — Z9889 Other specified postprocedural states: Secondary | ICD-10-CM | POA: Insufficient documentation

## 2016-01-01 DIAGNOSIS — I10 Essential (primary) hypertension: Secondary | ICD-10-CM | POA: Insufficient documentation

## 2016-01-01 DIAGNOSIS — Z30011 Encounter for initial prescription of contraceptive pills: Secondary | ICD-10-CM | POA: Insufficient documentation

## 2016-01-01 DIAGNOSIS — K219 Gastro-esophageal reflux disease without esophagitis: Secondary | ICD-10-CM | POA: Insufficient documentation

## 2016-01-01 DIAGNOSIS — H189 Unspecified disorder of cornea: Secondary | ICD-10-CM | POA: Insufficient documentation

## 2016-01-01 DIAGNOSIS — Z79899 Other long term (current) drug therapy: Secondary | ICD-10-CM | POA: Insufficient documentation

## 2016-01-01 DIAGNOSIS — J45909 Unspecified asthma, uncomplicated: Secondary | ICD-10-CM | POA: Insufficient documentation

## 2016-01-01 DIAGNOSIS — F419 Anxiety disorder, unspecified: Secondary | ICD-10-CM

## 2016-01-01 LAB — COMPLETE METABOLIC PANEL WITH GFR
ALT: 13 U/L (ref 6–29)
AST: 15 U/L (ref 10–30)
Albumin: 4.5 g/dL (ref 3.6–5.1)
Alkaline Phosphatase: 107 U/L (ref 33–115)
BILIRUBIN TOTAL: 0.6 mg/dL (ref 0.2–1.2)
BUN: 10 mg/dL (ref 7–25)
CO2: 24 mmol/L (ref 20–31)
Calcium: 9.8 mg/dL (ref 8.6–10.2)
Chloride: 103 mmol/L (ref 98–110)
Creat: 0.86 mg/dL (ref 0.50–1.10)
GFR, Est African American: >89 mL/min (ref >=60)
GFR, Est Non African American: >89 mL/min (ref >=60)
GLUCOSE: 80 mg/dL (ref 65–99)
Potassium: 4 mmol/L (ref 3.5–5.3)
SODIUM: 136 mmol/L (ref 135–146)
TOTAL PROTEIN: 7.4 g/dL (ref 6.1–8.1)

## 2016-01-01 LAB — LIPID PANEL
Cholesterol: 178 mg/dL (ref 125–200)
HDL: 52 mg/dL (ref >=46)
LDL CALC: 116 mg/dL (ref <130)
Total CHOL/HDL Ratio: 3.4 Ratio (ref <=5.0)
Triglycerides: 51 mg/dL (ref <150)
VLDL: 10 mg/dL (ref <30)

## 2016-01-01 MED ORDER — NORETHINDRONE 0.35 MG PO TABS: 1.0000 | ORAL_TABLET | Freq: Every day | ORAL | 6 refills | Status: DC

## 2016-01-01 MED ORDER — FLUOXETINE HCL 20 MG PO CAPS: 20.0000 mg | ORAL_CAPSULE | Freq: Every day | ORAL | 3 refills | Status: DC

## 2016-01-01 MED ORDER — OFLOXACIN 0.3 % OP SOLN: 1.0000 [drp] | Freq: Four times a day (QID) | OPHTHALMIC | 0 refills | Status: DC

## 2016-01-01 MED FILL — OFLOXACIN 0.3% EYE DROPS: 0.3 | 15 days supply | Qty: 5 | Fill #0

## 2016-01-01 MED FILL — NORETHINDRONE 0.35 MG TAB: 0.35 | 28 days supply | Qty: 28 | Fill #0

## 2016-01-01 MED FILL — FLUoxetine HCL 20 MG CAPS: 20 | 30 days supply | Qty: 30 | Fill #0

## 2016-01-01 NOTE — Progress Notes (Signed)
Subjective:  Patient ID: Kelli Wells, female    DOB: 1985/10/11  Age: 30 y.o. MRN: [id number]  CC: Hypertension   HPI Kelli Wells is a 30 year old female with a history of anxiety and depression, hypertension who comes into the clinic for follow-up of her blood pressure which is controlled today compared to her last office visit.  She is fasting in anticipation of labs.  Complains of burning and itching in her left eye which has happened in the last 2 weekends and has had intermittent asymptomatic periods -She is asymptomatic at this time. Denies visual symptoms or visual discharge. Uses contact lenses but does make sure to wash her hands and take them out every night and changes them EVERY month.  Past Medical History:  Diagnosis Date  . Anxiety   . Asthma   . Depression   . GERD (gastroesophageal reflux disease)   . Hypertension   . Infection    UTI  . Pregnancy induced hypertension   . Vaginal Pap smear, abnormal    LEEP, normal since    Past Surgical History:  Procedure Laterality Date  . CHOLECYSTECTOMY N/A 12/18/2014   Procedure: LAPAROSCOPIC CHOLECYSTECTOMY WITH INTRAOPERATIVE CHOLANGIOGRAM;  Surgeon: Chevis PrettyPaul Toth III, MD;  Location: [facility] OR;  Service: General;  Laterality: N/A;  . LEEP    . LYMPH GLAND EXCISION      Allergies  Allergen Reactions  . Sulfa Antibiotics Anaphylaxis and Swelling     Outpatient Medications Prior to Visit  Medication Sig Dispense Refill  . acetaminophen (TYLENOL) 500 MG tablet Take 1,000 mg by mouth every 6 (six) hours as needed for mild pain.    Marland Kitchen. albuterol (PROVENTIL HFA;VENTOLIN HFA) 108 (90 BASE) MCG/ACT inhaler Inhale 2 puffs into the lungs every 6 (six) hours as needed for wheezing or shortness of breath.    . busPIRone (BUSPAR) 15 MG tablet Take 1 tablet (15 mg total) by mouth 2 (two) times daily. 60 tablet 3  . labetalol (NORMODYNE) 300 MG tablet Take 2 tablets (600 mg total) by mouth 2 (two) times daily. 30 tablet 3  .  NIFEdipine (PROCARDIA-XL/ADALAT-CC/NIFEDICAL-XL) 30 MG 24 hr tablet Take 1 tablet (30 mg total) by mouth daily. 30 tablet 3  . Prenatal Vit-Fe Fumarate-FA (PRENATAL MULTIVITAMIN) TABS tablet Take 1 tablet by mouth daily at 12 noon.    . ranitidine (ZANTAC) 150 MG tablet Take 150 mg by mouth 4 (four) times daily.    Marland Kitchen. FLUoxetine (PROZAC) 20 MG capsule Take 20 mg by mouth daily.    . norethindrone (CAMILA) 0.35 MG tablet Take 1 tablet by mouth daily.     No facility-administered medications prior to visit.     ROS Review of Systems  Constitutional: Negative for activity change, appetite change and fatigue.  HENT: Negative for congestion, sinus pressure and sore throat.   Eyes: Negative for visual disturbance.       See hpi  Respiratory: Negative for cough, chest tightness, shortness of breath and wheezing.   Cardiovascular: Negative for chest pain and palpitations.  Gastrointestinal: Negative for abdominal distention, abdominal pain and constipation.  Endocrine: Negative for polydipsia.  Genitourinary: Negative.  Negative for dysuria and frequency.  Musculoskeletal: Negative.  Negative for arthralgias and back pain.  Skin: Negative for rash.  Neurological: Negative for tremors, light-headedness and numbness.  Hematological: Does not bruise/bleed easily.  Psychiatric/Behavioral: Negative for agitation, behavioral problems and dysphoric mood.    Objective:  BP 125/79 (BP Location: Right Arm, Patient Position: Sitting, Cuff Size: Large)  Pulse 82   Temp 98.2 F (36.8 C) (Oral)   Ht 5\' 4"  (1.626 m)   Wt 174 lb (78.9 kg)   SpO2 98%   BMI 29.87 kg/m   BP/Weight 01/01/2016 12/14/2015 12/19/2014  Systolic BP 125 157 164  Diastolic BP 79 96 94  Wt. (Lbs) 174 179.2 -  BMI 29.87 31.74 -      Physical Exam  Constitutional: She is oriented to person, place, and time. She appears well-developed and well-nourished.  Cardiovascular: Normal rate, normal heart sounds and intact distal  pulses.   No murmur heard. Pulmonary/Chest: Effort normal and breath sounds normal. She has no wheezes. She has no rales. She exhibits no tenderness.  Abdominal: Soft. Bowel sounds are normal. She exhibits no distension and no mass. There is no tenderness.  Musculoskeletal: Normal range of motion.  Neurological: She is alert and oriented to person, place, and time.     Assessment & Plan:   1. Essential hypertension Controlled Continue antihypertensives Fasting labs today.  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 Anxiety and depression Controlled Refilled Prozac  4. Visit for oral contraceptive prescription Refilled progestin only contraception She is currently nursing  Flu shot given today.  Meds ordered this encounter  Medications  . FLUoxetine (PROZAC) 20 MG capsule    Sig: Take 1 capsule (20 mg total) by mouth daily.    Dispense:  30 capsule    Refill:  3  . norethindrone (CAMILA) 0.35 MG tablet    Sig: Take 1 tablet (0.35 mg total) by mouth daily.    Dispense:  1 Package    Refill:  6  . ofloxacin (OCUFLOX) 0.3 % ophthalmic solution    Sig: Place 1 drop into the left eye 4 (four) times daily.    Dispense:  5 mL    Refill:  0    Follow-up: Return in about 3 months (around 04/02/2016) for Follow-up on hypertension.   Jaclyn ShaggyEnobong Amao MD

## 2016-01-01 NOTE — Patient Instructions (Signed)
Hypertension Hypertension, commonly called high blood pressure, is when the force of blood pumping through your arteries is too strong. Your arteries are the blood vessels that carry blood from your heart throughout your body. A blood pressure reading consists of a higher number over a lower number, such as 110/72. The higher number (systolic) is the pressure inside your arteries when your heart pumps. The lower number (diastolic) is the pressure inside your arteries when your heart relaxes. Ideally you want your blood pressure below 120/80. Hypertension forces your heart to work harder to pump blood. Your arteries may become narrow or stiff. Having untreated or uncontrolled hypertension can cause heart attack, stroke, kidney disease, and other problems. RISK FACTORS Some risk factors for high blood pressure are controllable. Others are not.  Risk factors you cannot control include:   Race. You may be at higher risk if you are African American.  Age. Risk increases with age.  Gender. Men are at higher risk than women before age 45 years. After age 65, women are at higher risk than men. Risk factors you can control include:  Not getting enough exercise or physical activity.  Being overweight.  Getting too much fat, sugar, calories, or salt in your diet.  Drinking too much alcohol. SIGNS AND SYMPTOMS Hypertension does not usually cause signs or symptoms. Extremely high blood pressure (hypertensive crisis) may cause headache, anxiety, shortness of breath, and nosebleed. DIAGNOSIS To check if you have hypertension, your health care provider will measure your blood pressure while you are seated, with your arm held at the level of your heart. It should be measured at least twice using the same arm. Certain conditions can cause a difference in blood pressure between your right and left arms. A blood pressure reading that is higher than normal on one occasion does not mean that you need treatment. If  it is not clear whether you have high blood pressure, you may be asked to return on a different day to have your blood pressure checked again. Or, you may be asked to monitor your blood pressure at home for 1 or more weeks. TREATMENT Treating high blood pressure includes making lifestyle changes and possibly taking medicine. Living a healthy lifestyle can help lower high blood pressure. You may need to change some of your habits. Lifestyle changes may include:  Following the DASH diet. This diet is high in fruits, vegetables, and whole grains. It is low in salt, red meat, and added sugars.  Keep your sodium intake below 2,300 mg per day.  Getting at least 30-45 minutes of aerobic exercise at least 4 times per week.  Losing weight if necessary.  Not smoking.  Limiting alcoholic beverages.  Learning ways to reduce stress. Your health care provider may prescribe medicine if lifestyle changes are not enough to get your blood pressure under control, and if one of the following is true:  You are 18-59 years of age and your systolic blood pressure is above 140.  You are 60 years of age or older, and your systolic blood pressure is above 150.  Your diastolic blood pressure is above 90.  You have diabetes, and your systolic blood pressure is over 140 or your diastolic blood pressure is over 90.  You have kidney disease and your blood pressure is above 140/90.  You have heart disease and your blood pressure is above 140/90. Your personal target blood pressure may vary depending on your medical conditions, your age, and other factors. HOME CARE INSTRUCTIONS    Have your blood pressure rechecked as directed by your health care provider.   Take medicines only as directed by your health care provider. Follow the directions carefully. Blood pressure medicines must be taken as prescribed. The medicine does not work as well when you skip doses. Skipping doses also puts you at risk for  problems.  Do not smoke.   Monitor your blood pressure at home as directed by your health care provider. SEEK MEDICAL CARE IF:   You think you are having a reaction to medicines taken.  You have recurrent headaches or feel dizzy.  You have swelling in your ankles.  You have trouble with your vision. SEEK IMMEDIATE MEDICAL CARE IF:  You develop a severe headache or confusion.  You have unusual weakness, numbness, or feel faint.  You have severe chest or abdominal pain.  You vomit repeatedly.  You have trouble breathing. MAKE SURE YOU:   Understand these instructions.  Will watch your condition.  Will get help right away if you are not doing well or get worse.   This information is not intended to replace advice given to you by your health care provider. Make sure you discuss any questions you have with your health care provider.   Document Released: 04/29/2005 Document Revised: 09/13/2014 Document Reviewed: 02/19/2013 Elsevier Interactive Patient Education 2016 Elsevier Inc.  

## 2016-01-01 NOTE — Progress Notes (Signed)
Left eye pain- burning and itching Fasting today for blood work Med refills

## 2016-01-03 ENCOUNTER — Telehealth: Payer: Self-pay

## 2016-01-03 NOTE — Telephone Encounter (Signed)
-----   Message from Enobong Amao, MD sent at 01/02/2016  5:19 PM EDT ----- Please inform the patient that labs are normal. Thank you. 

## 2016-01-03 NOTE — Telephone Encounter (Signed)
-----   Message from Jaclyn ShaggyEnobong Amao, MD sent at 01/02/2016  5:19 PM EDT ----- Please inform the patient that labs are normal. Thank you.

## 2016-01-03 NOTE — Telephone Encounter (Signed)
Patient called back and writer was able to give her the good news that all her test results per Dr. Venetia NightAmao came back normal.  Patient stated understanding.

## 2016-01-03 NOTE — Telephone Encounter (Signed)
Writer called patient to give her lab results, LVM asking patient to return this call to discuss.

## 2016-01-12 MED FILL — NIFEDIPINE ER 30 MG TABLET: 30 | 30 days supply | Qty: 30 | Fill #1

## 2016-01-12 MED FILL — LABETALOL HCL 300 MG TABLET: 300 | 7 days supply | Qty: 30 | Fill #2

## 2016-02-14 MED FILL — NIFEDIPINE ER 30 MG TABLET: 30 | 30 days supply | Qty: 30 | Fill #2

## 2016-02-14 MED FILL — busPIRone HCL 15 MG TABS: 15 | 30 days supply | Qty: 60 | Fill #1

## 2016-02-14 MED FILL — LABETALOL HCL 300 MG TABLET: 300 | 7 days supply | Qty: 30 | Fill #3

## 2016-02-29 ENCOUNTER — Telehealth: Payer: Self-pay | Admitting: Family Medicine

## 2016-02-29 NOTE — Telephone Encounter (Signed)
Pt. Called stating she was prescribed busPIRone (BUSPAR) 15 MG tablet  And it is helping her a lot but she read in the bottle that it can cause drowsiness  And pt. States that at work she kind of fell asleep and her employer got on her and tried  To fire her. Pt. Would like a letter stating that she is on the medication. Please f/u

## 2016-03-01 ENCOUNTER — Other Ambulatory Visit: Payer: Self-pay | Admitting: Family Medicine

## 2016-03-01 NOTE — Telephone Encounter (Signed)
If BuSpar causes sedation at work, advised to take daily dose at night rather than twice a day. Letter is ready for pickup.

## 2016-03-01 NOTE — Telephone Encounter (Signed)
Writer called patient and she, her husband or her mother will pick up requested letter today.  Patient also asked to try and take her daily dose at bedtime which she stated understanding.

## 2016-03-04 MED FILL — LABETALOL HCL 300 MG TABLET: 300 | 23 days supply | Qty: 90 | Fill #0

## 2016-04-12 ENCOUNTER — Telehealth: Payer: Self-pay | Admitting: Family Medicine

## 2016-04-12 ENCOUNTER — Other Ambulatory Visit: Payer: Self-pay | Admitting: Family Medicine

## 2016-04-12 MED FILL — busPIRone HCL 15 MG TABS: 15 | 30 days supply | Qty: 60 | Fill #2

## 2016-04-12 MED FILL — LABETALOL HCL 300 MG TABLET: 300 | 30 days supply | Qty: 120 | Fill #0

## 2016-04-12 MED FILL — NIFEDIPINE ER 30 MG TABLET: 30 | 30 days supply | Qty: 30 | Fill #3

## 2016-04-12 NOTE — Telephone Encounter (Signed)
Patient would like a letter to give to her apartment complex explaining why she needs an animal for emotional support because she suffers with depression. Patient said she has been experiencing homelessness Please follow up with the patient

## 2016-04-15 NOTE — Telephone Encounter (Signed)
Ready for pick up

## 2016-04-15 NOTE — Telephone Encounter (Signed)
Writer called patient to inform her that her requested letter is ready for pick up at the front desk.

## 2016-04-22 ENCOUNTER — Ambulatory Visit: Payer: Self-pay | Admitting: Family Medicine

## 2016-04-22 ENCOUNTER — Ambulatory Visit: Payer: Medicaid Other | Attending: Family Medicine | Admitting: Family Medicine

## 2016-04-22 ENCOUNTER — Encounter: Payer: Self-pay | Admitting: Family Medicine

## 2016-04-22 VITALS — BP 145/82 | HR 72 | Temp 98.2°F | Resp 18 | Ht 64.0 in | Wt 183.0 lb

## 2016-04-22 DIAGNOSIS — F329 Major depressive disorder, single episode, unspecified: Secondary | ICD-10-CM | POA: Insufficient documentation

## 2016-04-22 DIAGNOSIS — N946 Dysmenorrhea, unspecified: Secondary | ICD-10-CM | POA: Insufficient documentation

## 2016-04-22 DIAGNOSIS — Z79899 Other long term (current) drug therapy: Secondary | ICD-10-CM | POA: Insufficient documentation

## 2016-04-22 DIAGNOSIS — I10 Essential (primary) hypertension: Secondary | ICD-10-CM | POA: Insufficient documentation

## 2016-04-22 DIAGNOSIS — F418 Other specified anxiety disorders: Secondary | ICD-10-CM

## 2016-04-22 DIAGNOSIS — F419 Anxiety disorder, unspecified: Secondary | ICD-10-CM | POA: Insufficient documentation

## 2016-04-22 DIAGNOSIS — N939 Abnormal uterine and vaginal bleeding, unspecified: Secondary | ICD-10-CM

## 2016-04-22 DIAGNOSIS — R109 Unspecified abdominal pain: Secondary | ICD-10-CM

## 2016-04-22 MED ORDER — NIFEDIPINE ER 60 MG PO TB24: 60.0000 mg | ORAL_TABLET | Freq: Every day | ORAL | 3 refills | Status: DC

## 2016-04-22 MED ORDER — LABETALOL HCL 300 MG PO TABS: 600.0000 mg | ORAL_TABLET | Freq: Two times a day (BID) | ORAL | 3 refills | Status: DC

## 2016-04-22 MED ORDER — BUSPIRONE HCL 15 MG PO TABS: 15.0000 mg | ORAL_TABLET | Freq: Two times a day (BID) | ORAL | 3 refills | Status: DC

## 2016-04-22 MED ORDER — FLUOXETINE HCL 20 MG PO CAPS: 20.0000 mg | ORAL_CAPSULE | Freq: Every day | ORAL | 3 refills | Status: DC

## 2016-04-22 NOTE — Progress Notes (Signed)
Subjective:  Patient ID: Kelli Wells, female    DOB: 1985-08-02  Age: 30 y.o. MRN: [id number]  CC: Dysmenorrhea   HPI Kelli Wells is a 30 year old female with history of hypertension, anxiety and depression who presents to the clinic for follow-up visit.  Her blood pressure is slightly elevated despite compliance with her antihypertensive. She complains of worsening anxiety during the day but states after she takes the BuSpar pills at night she feels better. Endorses some degree of stresses in her life which could be worsening her anxiety.  Also complains of intermittent lower abdominal pain; endorses constipation and loose bowel movements every 2-3 days. Denies nausea or vomiting.  Complaints of intermenstrual bleeding and now has periods about twice a month; sometimes spotting, at other times regular periods. She was placed on the mini pill because she is currently nursing.  Past Medical History:  Diagnosis Date  . Anxiety   . Asthma   . Depression   . GERD (gastroesophageal reflux disease)   . Hypertension   . Infection    UTI  . Pregnancy induced hypertension   . Vaginal Pap smear, abnormal    LEEP, normal since    Past Surgical History:  Procedure Laterality Date  . CHOLECYSTECTOMY N/A 12/18/2014   Procedure: LAPAROSCOPIC CHOLECYSTECTOMY WITH INTRAOPERATIVE CHOLANGIOGRAM;  Surgeon: Chevis PrettyPaul Toth III, MD;  Location: [facility] OR;  Service: General;  Laterality: N/A;  . LEEP    . LYMPH GLAND EXCISION      Allergies  Allergen Reactions  . Sulfa Antibiotics Anaphylaxis and Swelling     Outpatient Medications Prior to Visit  Medication Sig Dispense Refill  . acetaminophen (TYLENOL) 500 MG tablet Take 1,000 mg by mouth every 6 (six) hours as needed for mild pain.    Marland Kitchen. albuterol (PROVENTIL HFA;VENTOLIN HFA) 108 (90 BASE) MCG/ACT inhaler Inhale 2 puffs into the lungs every 6 (six) hours as needed for wheezing or shortness of breath.    . norethindrone (CAMILA) 0.35 MG  tablet Take 1 tablet (0.35 mg total) by mouth daily. 1 Package 6  . Prenatal Vit-Fe Fumarate-FA (PRENATAL MULTIVITAMIN) TABS tablet Take 1 tablet by mouth daily at 12 noon.    . ranitidine (ZANTAC) 150 MG tablet Take 150 mg by mouth 4 (four) times daily.    . busPIRone (BUSPAR) 15 MG tablet Take 1 tablet (15 mg total) by mouth 2 (two) times daily. 60 tablet 3  . FLUoxetine (PROZAC) 20 MG capsule Take 1 capsule (20 mg total) by mouth daily. 30 capsule 3  . labetalol (NORMODYNE) 300 MG tablet TAKE 2 TABLETS BY MOUTH 2 TIMES DAILY. 120 tablet 0  . NIFEdipine (PROCARDIA-XL/ADALAT-CC/NIFEDICAL-XL) 30 MG 24 hr tablet Take 1 tablet (30 mg total) by mouth daily. 30 tablet 3  . ofloxacin (OCUFLOX) 0.3 % ophthalmic solution Place 1 drop into the left eye 4 (four) times daily. 5 mL 0   No facility-administered medications prior to visit.     ROS Review of Systems  Constitutional: Negative for activity change, appetite change and fatigue.  HENT: Negative for congestion, sinus pressure and sore throat.   Eyes: Negative for visual disturbance.  Respiratory: Negative for cough, chest tightness, shortness of breath and wheezing.   Cardiovascular: Negative for chest pain and palpitations.  Gastrointestinal: Positive for abdominal pain. Negative for abdominal distention and constipation.  Endocrine: Negative for polydipsia.  Genitourinary: Positive for menstrual problem. Negative for dysuria and frequency.  Musculoskeletal: Negative for arthralgias and back pain.  Skin: Negative for rash.  Neurological: Negative for tremors, light-headedness and numbness.  Hematological: Does not bruise/bleed easily.  Psychiatric/Behavioral: Negative for agitation and behavioral problems.       Positive for anxiety    Objective:  BP (!) 145/82 (BP Location: Right Arm, Patient Position: Sitting, Cuff Size: Normal)   Pulse 72   Temp 98.2 F (36.8 C) (Oral)   Resp 18   Ht 5\' 4"  (1.626 m)   Wt 183 lb (83 kg)   LMP  04/13/2016   SpO2 100%   BMI 31.41 kg/m   BP/Weight 04/22/2016 01/01/2016 12/14/2015  Systolic BP 145 125 157  Diastolic BP 82 79 96  Wt. (Lbs) 183 174 179.2  BMI 31.41 29.87 31.74      Physical Exam  Constitutional: She is oriented to person, place, and time. She appears well-developed and well-nourished.  HENT:  Right Ear: External ear normal.  Left Ear: External ear normal.  Neck: No JVD present.  Cardiovascular: Normal rate, normal heart sounds and intact distal pulses.   No murmur heard. Pulmonary/Chest: Effort normal and breath sounds normal. She has no wheezes. She has no rales. She exhibits no tenderness.  Abdominal: Soft. Bowel sounds are normal. She exhibits no distension and no mass. There is no tenderness.  Musculoskeletal: Normal range of motion.  Neurological: She is alert and oriented to person, place, and time.  Skin: Skin is warm and dry.  Psychiatric: She has a normal mood and affect.   CMP Latest Ref Rng & Units 01/01/2016 12/18/2014 12/16/2014  Glucose 65 - 99 mg/dL 80 95 95  BUN 7 - 25 mg/dL 10 5(L) 10  Creatinine 0.50 - 1.10 mg/dL 1.61 0.96 0.45  Sodium 135 - 146 mmol/L 136 138 138  Potassium 3.5 - 5.3 mmol/L 4.0 4.4 3.8  Chloride 98 - 110 mmol/L 103 110 105  CO2 20 - 31 mmol/L 24 22 20(L)  Calcium 8.6 - 10.2 mg/dL 9.8 9.1 9.8  Total Protein 6.1 - 8.1 g/dL 7.4 6.3(L) 7.0  Total Bilirubin 0.2 - 1.2 mg/dL 0.6 0.4 0.4  Alkaline Phos 33 - 115 U/L 107 88 110  AST 10 - 30 U/L 15 14(L) 24  ALT 6 - 29 U/L 13 13(L) 17    Lipid Panel     Component Value Date/Time   CHOL 178 01/01/2016 1020   TRIG 51 01/01/2016 1020   HDL 52 01/01/2016 1020   CHOLHDL 3.4 01/01/2016 1020   VLDL 10 01/01/2016 1020   LDLCALC 116 01/01/2016 1020      Assessment & Plan:   1. Anxiety and depression Uncontrolled Likely due to underlying stressors Will need cognitive behavioral therapy-we'll arrange for this at her next visit with the clinical social worker If this doesn't  help will consider increasing the dose of fluoxetine. - busPIRone (BUSPAR) 15 MG tablet; Take 1 tablet (15 mg total) by mouth 2 (two) times daily.  Dispense: 60 tablet; Refill: 3 - FLUoxetine (PROZAC) 20 MG capsule; Take 1 capsule (20 mg total) by mouth daily.  Dispense: 30 capsule; Refill: 3  2. Essential hypertension Uncontrolled Increased dose of nifedipine Low-sodium diet - NIFEdipine (PROCARDIA-XL/ADALAT CC) 60 MG 24 hr tablet; Take 1 tablet (60 mg total) by mouth daily.  Dispense: 30 tablet; Refill: 3 - labetalol (NORMODYNE) 300 MG tablet; Take 2 tablets (600 mg total) by mouth 2 (two) times daily.  Dispense: 120 tablet; Refill: 3  3. Abdominal pain, unspecified abdominal location Could be secondary to constipation as a side effect of nifedipine Advised  to increase fiber  4. Abnormal uterine bleeding Likely secondary to use of the minipill She is currently nursing and given her history of smoking and hypertension the minipill is the safest at this point Discussed once she is able to wean the baby we can consider possible fungal forms of contraception   Meds ordered this encounter  Medications  . NIFEdipine (PROCARDIA-XL/ADALAT CC) 60 MG 24 hr tablet    Sig: Take 1 tablet (60 mg total) by mouth daily.    Dispense:  30 tablet    Refill:  3    Discontinue previous dose  . busPIRone (BUSPAR) 15 MG tablet    Sig: Take 1 tablet (15 mg total) by mouth 2 (two) times daily.    Dispense:  60 tablet    Refill:  3  . FLUoxetine (PROZAC) 20 MG capsule    Sig: Take 1 capsule (20 mg total) by mouth daily.    Dispense:  30 capsule    Refill:  3  . labetalol (NORMODYNE) 300 MG tablet    Sig: Take 2 tablets (600 mg total) by mouth 2 (two) times daily.    Dispense:  120 tablet    Refill:  3    Follow-up: Return in about 3 weeks (around 05/13/2016) for Follow-up on hypertension and anxiety.   Jaclyn ShaggyEnobong Amao MD

## 2016-04-22 NOTE — Patient Instructions (Signed)
Hypertension Hypertension, commonly called high blood pressure, is when the force of blood pumping through your arteries is too strong. Your arteries are the blood vessels that carry blood from your heart throughout your body. A blood pressure reading consists of a higher number over a lower number, such as 110/72. The higher number (systolic) is the pressure inside your arteries when your heart pumps. The lower number (diastolic) is the pressure inside your arteries when your heart relaxes. Ideally you want your blood pressure below 120/80. Hypertension forces your heart to work harder to pump blood. Your arteries may become narrow or stiff. Having untreated or uncontrolled hypertension can cause heart attack, stroke, kidney disease, and other problems. What increases the risk? Some risk factors for high blood pressure are controllable. Others are not. Risk factors you cannot control include:  Race. You may be at higher risk if you are African American.  Age. Risk increases with age.  Gender. Men are at higher risk than women before age 45 years. After age 65, women are at higher risk than men. Risk factors you can control include:  Not getting enough exercise or physical activity.  Being overweight.  Getting too much fat, sugar, calories, or salt in your diet.  Drinking too much alcohol. What are the signs or symptoms? Hypertension does not usually cause signs or symptoms. Extremely high blood pressure (hypertensive crisis) may cause headache, anxiety, shortness of breath, and nosebleed. How is this diagnosed? To check if you have hypertension, your health care provider will measure your blood pressure while you are seated, with your arm held at the level of your heart. It should be measured at least twice using the same arm. Certain conditions can cause a difference in blood pressure between your right and left arms. A blood pressure reading that is higher than normal on one occasion does  not mean that you need treatment. If it is not clear whether you have high blood pressure, you may be asked to return on a different day to have your blood pressure checked again. Or, you may be asked to monitor your blood pressure at home for 1 or more weeks. How is this treated? Treating high blood pressure includes making lifestyle changes and possibly taking medicine. Living a healthy lifestyle can help lower high blood pressure. You may need to change some of your habits. Lifestyle changes may include:  Following the DASH diet. This diet is high in fruits, vegetables, and whole grains. It is low in salt, red meat, and added sugars.  Keep your sodium intake below 2,300 mg per day.  Getting at least 30-45 minutes of aerobic exercise at least 4 times per week.  Losing weight if necessary.  Not smoking.  Limiting alcoholic beverages.  Learning ways to reduce stress. Your health care provider may prescribe medicine if lifestyle changes are not enough to get your blood pressure under control, and if one of the following is true:  You are 18-59 years of age and your systolic blood pressure is above 140.  You are 60 years of age or older, and your systolic blood pressure is above 150.  Your diastolic blood pressure is above 90.  You have diabetes, and your systolic blood pressure is over 140 or your diastolic blood pressure is over 90.  You have kidney disease and your blood pressure is above 140/90.  You have heart disease and your blood pressure is above 140/90. Your personal target blood pressure may vary depending on your medical   conditions, your age, and other factors. Follow these instructions at home:  Have your blood pressure rechecked as directed by your health care provider.  Take medicines only as directed by your health care provider. Follow the directions carefully. Blood pressure medicines must be taken as prescribed. The medicine does not work as well when you skip  doses. Skipping doses also puts you at risk for problems.  Do not smoke.  Monitor your blood pressure at home as directed by your health care provider. Contact a health care provider if:  You think you are having a reaction to medicines taken.  You have recurrent headaches or feel dizzy.  You have swelling in your ankles.  You have trouble with your vision. Get help right away if:  You develop a severe headache or confusion.  You have unusual weakness, numbness, or feel faint.  You have severe chest or abdominal pain.  You vomit repeatedly.  You have trouble breathing. This information is not intended to replace advice given to you by your health care provider. Make sure you discuss any questions you have with your health care provider. Document Released: 04/29/2005 Document Revised: 10/05/2015 Document Reviewed: 02/19/2013 Elsevier Interactive Patient Education  2017 [employer] Inc[employer]  

## 2016-04-22 NOTE — Progress Notes (Signed)
Patient is here for HTN  Patient has taken medication today. Patient has eaten today.

## 2016-05-15 ENCOUNTER — Telehealth: Payer: Self-pay | Admitting: Family Medicine

## 2016-05-15 NOTE — Telephone Encounter (Signed)
Pt. Came into facility to drop off a form to be filled out by her PCP.  Pt. States that she needs the form for the place where she will be living. Pt. States that her PCP had already written her a letter but the company Where she will be living at needs the form. Pt. Was informed that it would be Put in her PCP box and it would take 7-14 business days. Pt. Stated that she  Needed the form by Friday. Please f/u with pt.

## 2016-05-20 NOTE — Telephone Encounter (Signed)
Writer called patient to let her know that her form is ready for pick up at the front desk.  LVM on her cell phone.

## 2016-05-20 NOTE — Telephone Encounter (Signed)
Form is ready for pick up.

## 2016-05-24 ENCOUNTER — Telehealth: Payer: Self-pay | Admitting: Family Medicine

## 2016-05-24 NOTE — Telephone Encounter (Signed)
Pt came in with service animal policy form  Pt states number 2 on page 2 needs to be checked "yes" instead of "no"  Previous form and new blank form has been placed in provider's box

## 2016-05-27 ENCOUNTER — Ambulatory Visit: Payer: Self-pay | Attending: Family Medicine | Admitting: Family Medicine

## 2016-05-27 ENCOUNTER — Encounter: Payer: Self-pay | Admitting: Family Medicine

## 2016-05-27 VITALS — BP 162/92 | HR 76 | Temp 99.0°F | Ht 64.0 in | Wt 184.4 lb

## 2016-05-27 DIAGNOSIS — Z590 Homelessness: Secondary | ICD-10-CM | POA: Insufficient documentation

## 2016-05-27 DIAGNOSIS — F418 Other specified anxiety disorders: Secondary | ICD-10-CM

## 2016-05-27 DIAGNOSIS — K219 Gastro-esophageal reflux disease without esophagitis: Secondary | ICD-10-CM | POA: Insufficient documentation

## 2016-05-27 DIAGNOSIS — J45909 Unspecified asthma, uncomplicated: Secondary | ICD-10-CM | POA: Insufficient documentation

## 2016-05-27 DIAGNOSIS — I10 Essential (primary) hypertension: Secondary | ICD-10-CM | POA: Insufficient documentation

## 2016-05-27 DIAGNOSIS — F329 Major depressive disorder, single episode, unspecified: Secondary | ICD-10-CM | POA: Insufficient documentation

## 2016-05-27 DIAGNOSIS — F419 Anxiety disorder, unspecified: Secondary | ICD-10-CM | POA: Insufficient documentation

## 2016-05-27 DIAGNOSIS — F32A Depression, unspecified: Secondary | ICD-10-CM

## 2016-05-27 DIAGNOSIS — Z882 Allergy status to sulfonamides status: Secondary | ICD-10-CM | POA: Insufficient documentation

## 2016-05-27 MED FILL — busPIRone HCL 15 MG TABS: 15 | 30 days supply | Qty: 60 | Fill #0

## 2016-05-27 MED FILL — FLUoxetine HCL 20 MG CAPS: 20 | 30 days supply | Qty: 30 | Fill #0

## 2016-05-27 MED FILL — ?NIFEDIPINE ER 60 MG TABLET: 60 | 30 days supply | Qty: 30 | Fill #0

## 2016-05-27 NOTE — Progress Notes (Signed)
Out of nifedipine Med refills

## 2016-05-27 NOTE — Patient Instructions (Signed)
Hypertension Hypertension, commonly called high blood pressure, is when the force of blood pumping through your arteries is too strong. Your arteries are the blood vessels that carry blood from your heart throughout your body. A blood pressure reading consists of a higher number over a lower number, such as 110/72. The higher number (systolic) is the pressure inside your arteries when your heart pumps. The lower number (diastolic) is the pressure inside your arteries when your heart relaxes. Ideally you want your blood pressure below 120/80. Hypertension forces your heart to work harder to pump blood. Your arteries may become narrow or stiff. Having untreated or uncontrolled hypertension can cause heart attack, stroke, kidney disease, and other problems. What increases the risk? Some risk factors for high blood pressure are controllable. Others are not. Risk factors you cannot control include:  Race. You may be at higher risk if you are African American.  Age. Risk increases with age.  Gender. Men are at higher risk than women before age 45 years. After age 65, women are at higher risk than men. Risk factors you can control include:  Not getting enough exercise or physical activity.  Being overweight.  Getting too much fat, sugar, calories, or salt in your diet.  Drinking too much alcohol. What are the signs or symptoms? Hypertension does not usually cause signs or symptoms. Extremely high blood pressure (hypertensive crisis) may cause headache, anxiety, shortness of breath, and nosebleed. How is this diagnosed? To check if you have hypertension, your health care provider will measure your blood pressure while you are seated, with your arm held at the level of your heart. It should be measured at least twice using the same arm. Certain conditions can cause a difference in blood pressure between your right and left arms. A blood pressure reading that is higher than normal on one occasion does  not mean that you need treatment. If it is not clear whether you have high blood pressure, you may be asked to return on a different day to have your blood pressure checked again. Or, you may be asked to monitor your blood pressure at home for 1 or more weeks. How is this treated? Treating high blood pressure includes making lifestyle changes and possibly taking medicine. Living a healthy lifestyle can help lower high blood pressure. You may need to change some of your habits. Lifestyle changes may include:  Following the DASH diet. This diet is high in fruits, vegetables, and whole grains. It is low in salt, red meat, and added sugars.  Keep your sodium intake below 2,300 mg per day.  Getting at least 30-45 minutes of aerobic exercise at least 4 times per week.  Losing weight if necessary.  Not smoking.  Limiting alcoholic beverages.  Learning ways to reduce stress. Your health care provider may prescribe medicine if lifestyle changes are not enough to get your blood pressure under control, and if one of the following is true:  You are 18-59 years of age and your systolic blood pressure is above 140.  You are 60 years of age or older, and your systolic blood pressure is above 150.  Your diastolic blood pressure is above 90.  You have diabetes, and your systolic blood pressure is over 140 or your diastolic blood pressure is over 90.  You have kidney disease and your blood pressure is above 140/90.  You have heart disease and your blood pressure is above 140/90. Your personal target blood pressure may vary depending on your medical   conditions, your age, and other factors. Follow these instructions at home:  Have your blood pressure rechecked as directed by your health care provider.  Take medicines only as directed by your health care provider. Follow the directions carefully. Blood pressure medicines must be taken as prescribed. The medicine does not work as well when you skip  doses. Skipping doses also puts you at risk for problems.  Do not smoke.  Monitor your blood pressure at home as directed by your health care provider. Contact a health care provider if:  You think you are having a reaction to medicines taken.  You have recurrent headaches or feel dizzy.  You have swelling in your ankles.  You have trouble with your vision. Get help right away if:  You develop a severe headache or confusion.  You have unusual weakness, numbness, or feel faint.  You have severe chest or abdominal pain.  You vomit repeatedly.  You have trouble breathing. This information is not intended to replace advice given to you by your health care provider. Make sure you discuss any questions you have with your health care provider. Document Released: 04/29/2005 Document Revised: 10/05/2015 Document Reviewed: 02/19/2013 Elsevier Interactive Patient Education  2017 [employer] Inc[employer]  

## 2016-05-27 NOTE — Progress Notes (Signed)
Subjective:  Patient ID: Kelli Wells, female    DOB: [birth year]  Age: 31 y.o. MRN: [id number]  CC: Hypertension   HPI Kelli Wells  Is a 31 year old female with a history of hypertension, anxiety who presents today for follow-up of her blood pressure. Her blood pressure is elevated today and she endorses running out of nifedipine but has been compliant with other medications.  I had completed a form for her to have a dog as a companion due to anxiety but she informs me today it was not approved. Her anxiety does not affect her ability to perform her ADLs and she has no physical impairment which is requested to be checked for her to be approved.  She is currently homeless. She also complains of abdominal pain with her antihypertensive and is unsure which of them causes it.  Past Medical History:  Diagnosis Date  . Anxiety   . Asthma   . Depression   . GERD (gastroesophageal reflux disease)   . Hypertension   . Infection    UTI  . Pregnancy induced hypertension   . Vaginal Pap smear, abnormal    LEEP, normal since    Past Surgical History:  Procedure Laterality Date  . CHOLECYSTECTOMY N/A 12/18/2014   Procedure: LAPAROSCOPIC CHOLECYSTECTOMY WITH INTRAOPERATIVE CHOLANGIOGRAM;  Surgeon: Chevis Pretty III, MD;  Location: [facility] OR;  Service: General;  Laterality: N/A;  . LEEP    . LYMPH GLAND EXCISION      Allergies  Allergen Reactions  . Sulfa Antibiotics Anaphylaxis and Swelling     Outpatient Medications Prior to Visit  Medication Sig Dispense Refill  . acetaminophen (TYLENOL) 500 MG tablet Take 1,000 mg by mouth every 6 (six) hours as needed for mild pain.    Marland Kitchen albuterol (PROVENTIL HFA;VENTOLIN HFA) 108 (90 BASE) MCG/ACT inhaler Inhale 2 puffs into the lungs every 6 (six) hours as needed for wheezing or shortness of breath.    . busPIRone (BUSPAR) 15 MG tablet Take 1 tablet (15 mg total) by mouth 2 (two) times daily. 60 tablet 3  . FLUoxetine (PROZAC) 20 MG capsule Take  1 capsule (20 mg total) by mouth daily. 30 capsule 3  . labetalol (NORMODYNE) 300 MG tablet Take 2 tablets (600 mg total) by mouth 2 (two) times daily. 120 tablet 3  . norethindrone (CAMILA) 0.35 MG tablet Take 1 tablet (0.35 mg total) by mouth daily. 1 Package 6  . Prenatal Vit-Fe Fumarate-FA (PRENATAL MULTIVITAMIN) TABS tablet Take 1 tablet by mouth daily at 12 noon.    . ranitidine (ZANTAC) 150 MG tablet Take 150 mg by mouth 4 (four) times daily.    Marland Kitchen NIFEdipine (PROCARDIA-XL/ADALAT CC) 60 MG 24 hr tablet Take 1 tablet (60 mg total) by mouth daily. (Patient not taking: Reported on 05/27/2016) 30 tablet 3   No facility-administered medications prior to visit.     ROS Review of Systems Constitutional: Negative for activity change, appetite change and fatigue.  HENT: Negative for congestion, sinus pressure and sore throat.   Eyes: Negative for visual disturbance.  Respiratory: Negative for cough, chest tightness, shortness of breath and wheezing.   Cardiovascular: Negative for chest pain and palpitations.  Gastrointestinal: Negative for abdominal distention and constipation.  Endocrine: Negative for polydipsia.  Genitourinary: Negative for dysuria and frequency.  Musculoskeletal: Negative for arthralgias and back pain.  Skin: Negative for rash.  Neurological: Negative for tremors, light-headedness and numbness.  Hematological: Does not bruise/bleed easily.  Psychiatric/Behavioral: Negative for agitation and behavioral problems.  Positive for anxiety   Objective:  BP (!) 162/92 (BP Location: Right Arm, Cuff Size: Large)   Pulse 76   Temp 99 F (37.2 C) (Oral)   Ht 5\' 4"  (1.626 m)   Wt 184 lb 6.4 oz (83.6 kg)   SpO2 99%   BMI 31.65 kg/m   BP/Weight 05/27/2016 04/22/2016 01/01/2016  Systolic BP 162 145 125  Diastolic BP 92 82 79  Wt. (Lbs) 184.4 183 174  BMI 31.65 31.41 29.87      Physical Exam  Constitutional: She is oriented to person, place, and time. She appears  well-developed and well-nourished.  Cardiovascular: Normal rate, normal heart sounds and intact distal pulses.   No murmur heard. Pulmonary/Chest: Effort normal and breath sounds normal. She has no wheezes. She has no rales. She exhibits no tenderness.  Abdominal: Soft. Bowel sounds are normal. She exhibits no distension and no mass. There is no tenderness.  Musculoskeletal: Normal range of motion.  Neurological: She is alert and oriented to person, place, and time.     Assessment & Plan:   1. Essential hypertension Uncontrolled due to running out of medications Advised to pickup medication for pharmacy She complains of abdominal pain with one of her medications and we have discussed the process of elimination to help identify the culprit  2. Anxiety and depression Currently on Prozac and BuSpar    No orders of the defined types were placed in this encounter.   Follow-up: Return in about 1 month (around 06/27/2016) for Follow-up on hypertension.   Jaclyn ShaggyEnobong Amao MD

## 2016-05-29 ENCOUNTER — Ambulatory Visit: Payer: Self-pay | Admitting: Family Medicine

## 2016-06-18 ENCOUNTER — Ambulatory Visit: Payer: Medicaid Other | Attending: Family Medicine | Admitting: *Deleted

## 2016-06-18 VITALS — BP 122/70

## 2016-06-18 DIAGNOSIS — I10 Essential (primary) hypertension: Secondary | ICD-10-CM | POA: Insufficient documentation

## 2016-06-18 NOTE — Progress Notes (Signed)
Pt here for f/u BP check. Pt denies  HA ,new vison concerns, or generalized swelling.  States she has some anxiety attacks during the day were she has noticed that she has SOB and chest pain. The only way she can tolerate the buspar is to take it at night and she  she did not feel that the Buspar is holding her during the day.  She also addresses concern for abdominal pain and sweats after taking BP medication, Labetalol and Procardia on full stomach. Blood pressure taken manually. Nurse visit will be routed to provider to address concerns.Guy Francoravia Kelli Fickel, RN, BSN

## 2016-07-03 ENCOUNTER — Encounter (HOSPITAL_COMMUNITY): Payer: Self-pay | Admitting: *Deleted

## 2016-07-03 ENCOUNTER — Telehealth: Payer: Self-pay | Admitting: Family Medicine

## 2016-07-03 ENCOUNTER — Emergency Department (HOSPITAL_COMMUNITY)
Admission: EM | Admit: 2016-07-03 | Discharge: 2016-07-03 | Disposition: A | Discharge status: Home / Self Care | Payer: Self-pay | Attending: Emergency Medicine | Admitting: Emergency Medicine

## 2016-07-03 DIAGNOSIS — Z79899 Other long term (current) drug therapy: Secondary | ICD-10-CM | POA: Insufficient documentation

## 2016-07-03 DIAGNOSIS — F17210 Nicotine dependence, cigarettes, uncomplicated: Secondary | ICD-10-CM | POA: Insufficient documentation

## 2016-07-03 DIAGNOSIS — I10 Essential (primary) hypertension: Secondary | ICD-10-CM | POA: Insufficient documentation

## 2016-07-03 DIAGNOSIS — J45909 Unspecified asthma, uncomplicated: Secondary | ICD-10-CM | POA: Insufficient documentation

## 2016-07-03 MED ORDER — NIFEDIPINE ER OSMOTIC RELEASE 90 MG PO TB24: 90.0000 mg | ORAL_TABLET | Freq: Every day | ORAL | 0 refills | Status: DC

## 2016-07-03 MED FILL — busPIRone HCL 15 MG TABS: 15 | 30 days supply | Qty: 60 | Fill #1

## 2016-07-03 MED FILL — FLUoxetine HCL 20 MG CAPS: 20 | 30 days supply | Qty: 30 | Fill #1

## 2016-07-03 MED FILL — LABETALOL HCL 300 MG TABLET: 300 | 30 days supply | Qty: 120 | Fill #0

## 2016-07-03 NOTE — Discharge Instructions (Signed)
No NSAID (ibuprofen/naproxen/advil) medications. Taken additional dose of labetalol 300 mg at lunchtime.

## 2016-07-03 NOTE — Telephone Encounter (Signed)
Pt states that she is prescribed 20mg  Prozac, is supposed to be 40mg . Pharmacy will not change prescription without Dr Approval. Please f/u.

## 2016-07-03 NOTE — ED Notes (Signed)
Per registration staff, Pt stepped outside but said she will be coming back.

## 2016-07-03 NOTE — ED Provider Notes (Signed)
[facility]-EMERGENCY DEPT Provider Note   CSN: [id number] Arrival date & time: 07/03/16  1025     History   Chief Complaint Chief Complaint  Patient presents with  . Hypertension    HPI Kelli Wells is a 31 y.o. female.  HPI Patient patient was referred from the health department where she was having her annual pelvic exam. Her blood pressure was found to be 170/110 and they felt that it was too high for them to be able to treat her and she was sent to the emergency department. Patient has had hypertension for 2 years since her last pregnancy. He states she is compliant with her medications.She does get intermittent headaches. She reports they're not severe. She has noted swelling in her ankles and hands more recently. Occasional chest pains. None recently. Patient is an occasional smoker. She reports she is careful about salt consumption. She does take intermittent NSAID medications for headache. She is currently breast-feeding. Past Medical History:  Diagnosis Date  . Anxiety   . Asthma   . Depression   . GERD (gastroesophageal reflux disease)   . Hypertension   . Infection    UTI  . Pregnancy induced hypertension   . Vaginal Pap smear, abnormal    LEEP, normal since    Patient Active Problem List   Diagnosis Date Noted  . Hypertension 12/14/2015  . Anxiety and depression 12/14/2015  . Cholecystitis with cholelithiasis 12/17/2014  . Pregnancy 05/22/2014  . Postpartum state 05/12/2014  . Chronic hypertension in pregnancy   . [redacted] weeks gestation of pregnancy   . Non-reactive NST (non-stress test)   . [redacted] weeks gestation of pregnancy     Past Surgical History:  Procedure Laterality Date  . CHOLECYSTECTOMY N/A 12/18/2014   Procedure: LAPAROSCOPIC CHOLECYSTECTOMY WITH INTRAOPERATIVE CHOLANGIOGRAM;  Surgeon: Chevis Pretty III, MD;  Location: [facility] OR;  Service: General;  Laterality: N/A;  . LEEP    . LYMPH GLAND EXCISION      OB History    Gravida Para Term Preterm AB Living   2 2 2  0 0 2   SAB TAB Ectopic Multiple Live Births   0 0 0 0 2       Home Medications    Prior to Admission medications   Medication Sig Start Date End Date Taking? Authorizing Provider  acetaminophen (TYLENOL) 500 MG tablet Take 1,000 mg by mouth every 6 (six) hours as needed for mild pain.    Historical Provider, MD  albuterol (PROVENTIL HFA;VENTOLIN HFA) 108 (90 BASE) MCG/ACT inhaler Inhale 2 puffs into the lungs every 6 (six) hours as needed for wheezing or shortness of breath.    Historical Provider, MD  busPIRone (BUSPAR) 15 MG tablet Take 1 tablet (15 mg total) by mouth 2 (two) times daily. 04/22/16   Jaclyn Shaggy, MD  FLUoxetine (PROZAC) 20 MG capsule Take 1 capsule (20 mg total) by mouth daily. 04/22/16   Jaclyn Shaggy, MD  labetalol (NORMODYNE) 300 MG tablet Take 2 tablets (600 mg total) by mouth 2 (two) times daily. 04/22/16   Jaclyn Shaggy, MD  NIFEdipine (PROCARDIA XL/ADALAT-CC) 90 MG 24 hr tablet Take 1 tablet (90 mg total) by mouth daily. 07/03/16   Arby Barrette, MD  NIFEdipine (PROCARDIA-XL/ADALAT CC) 60 MG 24 hr tablet Take 1 tablet (60 mg total) by mouth daily. Patient not taking: Reported on 05/27/2016 04/22/16   Jaclyn Shaggy, MD  norethindrone (CAMILA) 0.35 MG tablet Take 1 tablet (0.35 mg total) by mouth daily. 01/01/16   Enobong  Venetia NightAmao, MD  Prenatal Vit-Fe Fumarate-FA (PRENATAL MULTIVITAMIN) TABS tablet Take 1 tablet by mouth daily at 12 noon.    Historical Provider, MD  ranitidine (ZANTAC) 150 MG tablet Take 150 mg by mouth 4 (four) times daily.    Historical Provider, MD    Family History Family History  Problem Relation Age of Onset  . Hypertension Maternal Grandmother   . Cancer Maternal Grandmother     breast  . Hypertension Maternal Grandfather   . Kidney disease Paternal Grandmother   . Heart disease Paternal Grandfather   . Hypertension Mother   . Cancer Mother     rectal  . Hypertension Father   . Hearing loss Neg Hx     Social History Social History   Substance Use Topics  . Smoking status: Current Every Day Smoker    Packs/day: 0.25    Types: Cigarettes  . Smokeless tobacco: Never Used     Comment: quit June 2015  . Alcohol use 1.2 - 1.8 oz/week    2 - 3 Glasses of wine per week     Comment: occas     Allergies   Sulfa antibiotics   Review of Systems Review of Systems 10 Systems reviewed and are negative for acute change except as noted in the HPI.  Physical Exam Updated Vital Signs BP 166/98 (BP Location: Right Arm)   Pulse 76   Temp 98.1 F (36.7 C) (Oral)   Resp 14   LMP 06/26/2016   SpO2 100%   Physical Exam  Constitutional: She is oriented to person, place, and time. She appears well-developed and well-nourished. No distress.  HENT:  Head: Normocephalic and atraumatic.  Eyes: Conjunctivae and EOM are normal.  Neck: Neck supple.  Cardiovascular: Normal rate and regular rhythm.   No murmur heard. Pulmonary/Chest: Effort normal and breath sounds normal. No respiratory distress.  Abdominal: Soft. There is no tenderness.  Musculoskeletal: Normal range of motion. She exhibits no edema.  No edema at this time.  Neurological: She is alert and oriented to person, place, and time. She exhibits normal muscle tone. Coordination normal.  Skin: Skin is warm and dry.  Psychiatric: She has a normal mood and affect.  Nursing note and vitals reviewed.    ED Treatments / Results  Labs (all labs ordered are listed, but only abnormal results are displayed) Labs Reviewed - No data to display  EKG  EKG Interpretation None       Radiology No results found.  Procedures Procedures (including critical care time)  Medications Ordered in ED Medications - No data to display   Initial Impression / Assessment and Plan / ED Course  I have reviewed the triage vital signs and the nursing notes.  Pertinent labs & imaging results that were available during my care of the patient were reviewed by me and considered in my  medical decision making (see chart for details).      Final Clinical Impressions(s) / ED Diagnoses   Final diagnoses:  Essential hypertension  The patient is alert and well without any signs of hypertensive urgency. She has had chronic hypertension which has been somewhat difficult to manage since her pregnancy 2 years ago. At this time, and will be to increase her labetalol by adding an afternoon dose of 300 mg. We will also increase her nifedipine to 90 mg extenderelease. Patient will try to increase exercise. She is an occasional smoker and will also desist from any smoking. She will follow up with her primary  care physician within the week for reassessment and monitor her blood pressures at home.  New Prescriptions New Prescriptions   NIFEDIPINE (PROCARDIA XL/ADALAT-CC) 90 MG 24 HR TABLET    Take 1 tablet (90 mg total) by mouth daily.     Arby Barrette, MD 07/03/16 [phone]

## 2016-07-03 NOTE — ED Triage Notes (Addendum)
Pt reports gong for a check up at health department and sent here due to high bp. reports hx of htn and takes her meds as prescribed. Reports recent headaches. No acute distress noted at triage.

## 2016-07-03 NOTE — Telephone Encounter (Signed)
I have no record of Prozac 40mg  in her chart. Was the dose recently increased?

## 2016-07-04 ENCOUNTER — Telehealth: Payer: Self-pay | Admitting: Family Medicine

## 2016-07-04 NOTE — Telephone Encounter (Signed)
Writer called patient back and LVM requesting patient to return call to verify prozac dose.

## 2016-07-04 NOTE — Telephone Encounter (Signed)
Pt returning call from nurse

## 2016-07-05 MED FILL — NIFEDIPINE ER 90 MG TABLET: 90 | 30 days supply | Qty: 30 | Fill #0

## 2016-07-11 NOTE — Telephone Encounter (Signed)
Writer returned patient's call again.

## 2016-07-18 ENCOUNTER — Telehealth: Payer: Self-pay | Admitting: Family Medicine

## 2016-07-18 DIAGNOSIS — F329 Major depressive disorder, single episode, unspecified: Secondary | ICD-10-CM

## 2016-07-18 DIAGNOSIS — F419 Anxiety disorder, unspecified: Principal | ICD-10-CM

## 2016-07-18 NOTE — Telephone Encounter (Signed)
Patient returned nurse call to inform her that she is taking 40mg  of Prozac. Patient stated that her OBGYN had prescribed her this medication. Please follow up.  Thank you.

## 2016-07-19 MED ORDER — FLUOXETINE HCL 40 MG PO CAPS: 40.0000 mg | ORAL_CAPSULE | Freq: Every day | ORAL | 3 refills | Status: DC

## 2016-07-19 NOTE — Telephone Encounter (Signed)
Prozac changed to 40 mg.

## 2016-07-24 NOTE — Telephone Encounter (Signed)
Writer called and LVM that her prozac dose was changed to 40 mg.

## 2016-07-25 MED FILL — FLUoxetine HCL 40 MG CAPS: 40 | 30 days supply | Qty: 30 | Fill #0

## 2016-07-30 MED FILL — ?BUSPIRONE HCL 15 MG TABLET: 15 | 30 days supply | Qty: 60 | Fill #2

## 2016-08-05 MED FILL — LABETALOL HCL 300 MG TABLET: 300 | 30 days supply | Qty: 120 | Fill #1

## 2016-08-16 ENCOUNTER — Other Ambulatory Visit: Payer: Self-pay | Admitting: Pharmacist

## 2016-08-16 MED ORDER — NIFEDIPINE ER OSMOTIC RELEASE 90 MG PO TB24
90.0000 mg | ORAL_TABLET | Freq: Every day | ORAL | 0 refills | Status: DC
Start: 2016-08-16 — End: 2016-09-12

## 2016-08-16 NOTE — Telephone Encounter (Signed)
Refilled x 30 days - patient needs office visit. 

## 2016-08-19 MED FILL — FLUoxetine HCL 40 MG CAPS: 40 | 30 days supply | Qty: 30 | Fill #1

## 2016-08-19 MED FILL — NIFEDIPINE ER 90 MG TABLET: 90 | 30 days supply | Qty: 30 | Fill #0

## 2016-09-10 MED FILL — ?BUSPIRONE HCL 15 MG TABLET: 15 | 30 days supply | Qty: 60 | Fill #3

## 2016-09-10 MED FILL — LABETALOL HCL 300 MG TABLET: 300 | 30 days supply | Qty: 120 | Fill #2

## 2016-09-12 ENCOUNTER — Encounter: Payer: Self-pay | Admitting: Family Medicine

## 2016-09-12 ENCOUNTER — Ambulatory Visit: Payer: Self-pay | Attending: Family Medicine | Admitting: Family Medicine

## 2016-09-12 VITALS — BP 122/74 | HR 69 | Temp 98.0°F | Resp 18 | Ht 63.0 in | Wt 183.2 lb

## 2016-09-12 DIAGNOSIS — F32A Depression, unspecified: Secondary | ICD-10-CM

## 2016-09-12 DIAGNOSIS — Z79899 Other long term (current) drug therapy: Secondary | ICD-10-CM | POA: Insufficient documentation

## 2016-09-12 DIAGNOSIS — F419 Anxiety disorder, unspecified: Secondary | ICD-10-CM | POA: Insufficient documentation

## 2016-09-12 DIAGNOSIS — E669 Obesity, unspecified: Secondary | ICD-10-CM | POA: Insufficient documentation

## 2016-09-12 DIAGNOSIS — Z0000 Encounter for general adult medical examination without abnormal findings: Secondary | ICD-10-CM

## 2016-09-12 DIAGNOSIS — Z23 Encounter for immunization: Secondary | ICD-10-CM

## 2016-09-12 DIAGNOSIS — Z590 Homelessness: Secondary | ICD-10-CM | POA: Insufficient documentation

## 2016-09-12 DIAGNOSIS — F329 Major depressive disorder, single episode, unspecified: Secondary | ICD-10-CM | POA: Insufficient documentation

## 2016-09-12 DIAGNOSIS — I10 Essential (primary) hypertension: Secondary | ICD-10-CM | POA: Insufficient documentation

## 2016-09-12 MED ORDER — FLUOXETINE HCL 20 MG PO TABS: 20.0000 mg | ORAL_TABLET | Freq: Every day | ORAL | 3 refills | Status: DC

## 2016-09-12 MED ORDER — FLUOXETINE HCL 40 MG PO CAPS: 40.0000 mg | ORAL_CAPSULE | Freq: Every day | ORAL | 3 refills | Status: DC

## 2016-09-12 MED ORDER — NIFEDIPINE ER OSMOTIC RELEASE 90 MG PO TB24: 90.0000 mg | ORAL_TABLET | Freq: Every day | ORAL | 0 refills | Status: DC

## 2016-09-12 MED ORDER — LABETALOL HCL 300 MG PO TABS: 600.0000 mg | ORAL_TABLET | Freq: Two times a day (BID) | ORAL | 3 refills | Status: DC

## 2016-09-12 MED FILL — FLUoxetine HCL 40 MG CAPS: 40 | 30 days supply | Qty: 30 | Fill #0

## 2016-09-12 MED FILL — FLUoxetine HCL 20 MG CAPS: 20 | 30 days supply | Qty: 30 | Fill #0

## 2016-09-12 MED FILL — NIFEDIPINE ER 90 MG TABLET: 90 | 30 days supply | Qty: 30 | Fill #0

## 2016-09-12 NOTE — Progress Notes (Signed)
Patient is here for HTN  Patient denies pain for today  Patient only concern is when she takes her both BP medication she feels sick for 10 minutes she sweats & bad pain stomach   Patient stated that she eats or drink milk with it

## 2016-09-12 NOTE — Progress Notes (Signed)
Subjective:  Patient ID: Kelli Wells, female    DOB: 1986/03/04  Age: 31 y.o. MRN: [id number]  CC: Hypertension   HPI Haru Anspaugh presents for   Hypertension: Patient here for follow-up of elevated blood pressure. She is not exercising and is not adherent to low salt diet.  Blood pressure is not well controlled at home. Cardiac symptoms none. Patient denies chest pain, claudication, dyspnea, palpitations and syncope.  Cardiovascular risk factors: hypertension, obesity (BMI >= 30 kg/m2), sedentary lifestyle and smoking/ tobacco exposure. Use of agents associated with hypertension: none. History of target organ damage: none.   Anxiety and depression:  Patient has history of anxiety disorder and depression.  She has the following symptoms: difficulty concentrating, racing thoughts, and crying. Onset of symptoms was approximately several years ago, stable since that time. She denies current suicidal and homicidal ideation. Family history significant for no psychiatric illness.Possible organic causes contributing are: none. Risk factors: negative life event she is currently experiencing homelessness and has young two children and previous episode of depression Previous treatment includes BuSpar and Prozac and attends [facility].  She complains of the following side effects from the treatment: none. Prozac recently increased to 40 mg approximately 8 weeks ago. Reports she is on awaiting list for housing through [place]. She reports staying between hotels and her brother's home.     Outpatient Medications Prior to Visit  Medication Sig Dispense Refill  . acetaminophen (TYLENOL) 500 MG tablet Take 1,000 mg by mouth every 6 (six) hours as needed for mild pain.    Marland Kitchen albuterol (PROVENTIL HFA;VENTOLIN HFA) 108 (90 BASE) MCG/ACT inhaler Inhale 2 puffs into the lungs every 6 (six) hours as needed for wheezing or shortness of breath.    . busPIRone (BUSPAR) 15 MG tablet Take 1  tablet (15 mg total) by mouth 2 (two) times daily. 60 tablet 3  . norethindrone (CAMILA) 0.35 MG tablet Take 1 tablet (0.35 mg total) by mouth daily. 1 Package 6  . FLUoxetine (PROZAC) 40 MG capsule Take 1 capsule (40 mg total) by mouth daily. 30 capsule 3  . labetalol (NORMODYNE) 300 MG tablet Take 2 tablets (600 mg total) by mouth 2 (two) times daily. 120 tablet 3  . NIFEdipine (PROCARDIA XL/ADALAT-CC) 90 MG 24 hr tablet Take 1 tablet (90 mg total) by mouth daily. 30 tablet 0  . Prenatal Vit-Fe Fumarate-FA (PRENATAL MULTIVITAMIN) TABS tablet Take 1 tablet by mouth daily at 12 noon.    . ranitidine (ZANTAC) 150 MG tablet Take 150 mg by mouth 4 (four) times daily.     No facility-administered medications prior to visit.     ROS Review of Systems  Constitutional: Negative.   Eyes: Negative.   Respiratory: Negative.   Cardiovascular: Negative.   Gastrointestinal: Negative.   Musculoskeletal: Negative.   Skin: Negative.   Neurological: Negative.   Psychiatric/Behavioral: Positive for dysphoric mood. Negative for suicidal ideas. The patient is nervous/anxious.      Objective:  BP 122/74 (BP Location: Left Arm, Patient Position: Sitting, Cuff Size: Normal)   Pulse 69   Temp 98 F (36.7 C) (Oral)   Resp 18   Ht '5\' 3"'$  (1.6 m)   Wt 183 lb 3.2 oz (83.1 kg)   SpO2 99%   BMI 32.45 kg/m   BP/Weight 09/12/2016 10/30/5091 06/18/7122  Systolic BP 580 998 338  Diastolic BP 74 98 70  Wt. (Lbs) 183.2 - -  BMI 32.45 - -   Physical Exam  Constitutional:  She is oriented to person, place, and time. She appears well-developed and well-nourished.  Eyes: Conjunctivae are normal. Pupils are equal, round, and reactive to light.  Neck: No JVD present.  Cardiovascular: Normal rate, regular rhythm, normal heart sounds and intact distal pulses.   Pulmonary/Chest: Effort normal and breath sounds normal.  Abdominal: Soft. Bowel sounds are normal.  Neurological: She is alert and oriented to person, place,  and time.  Skin: Skin is warm and dry.  Psychiatric: Her mood appears anxious. She expresses no homicidal and no suicidal ideation. She expresses no suicidal plans and no homicidal plans.  Nursing note and vitals reviewed.  Depression screen [facility] 2/9 09/12/2016 05/27/2016 04/22/2016  Decreased Interest '1 2 1  '$ Down, Depressed, Hopeless '3 3 1  '$ PHQ - 2 Score '4 5 2  '$ Altered sleeping '3 1 1  '$ Tired, decreased energy '3 3 2  '$ Change in appetite '2 3 3  '$ Feeling bad or failure about yourself  '1 3 3  '$ Trouble concentrating '1 1 1  '$ Moving slowly or fidgety/restless '3 2 1  '$ Suicidal thoughts 0 0 0  PHQ-9 Score '17 18 13   '$ GAD 7 : Generalized Anxiety Score 09/12/2016 05/27/2016 04/22/2016 12/14/2015  Nervous, Anxious, on Edge '3 3 2 2  '$ Control/stop worrying '3 3 2 3  '$ Worry too much - different things '3 3 2 3  '$ Trouble relaxing '2 3 2 1  '$ Restless '1 2 2 2  '$ Easily annoyed or irritable '3 2 2 2  '$ Afraid - awful might happen '2 3 2 2  '$ Total GAD 7 Score '17 19 14 15     '$ Assessment & Plan:   Problem List Items Addressed This Visit      Cardiovascular and Mediastinum   Hypertension - Primary   Relevant Medications   labetalol (NORMODYNE) 300 MG tablet   NIFEdipine (PROCARDIA XL/ADALAT-CC) 90 MG 24 hr tablet   Other Relevant Orders   CMP14+EGFR     Other   Anxiety and depression   Increase dosage of Prozac to 60 mg daily.   Refer to clinic case management due to current homelessness situation.   Relevant Medications   FLUoxetine (PROZAC) 40 MG capsule   FLUoxetine (PROZAC) 20 MG tablet    Other Visit Diagnoses    Healthcare maintenance       Relevant Orders   Tdap vaccine greater than or equal to 7yo IM (Completed)      Meds ordered this encounter  Medications  . FLUoxetine (PROZAC) 40 MG capsule    Sig: Take 1 capsule (40 mg total) by mouth daily. Take with 1 capsule of (20 mg total) by mouth to total 60 mg daily.    Dispense:  30 capsule    Refill:  3    Take with 1 capsule of (20 mg total) by  mouth to total 60 mg daily.    Order Specific Question:   Supervising Provider    Answer:   Tresa Garter [id number]  . FLUoxetine (PROZAC) 20 MG tablet    Sig: Take 1 tablet (20 mg total) by mouth daily. Take with 1 capsule of (40 mg total) by mouth to total 60 mg daily.    Dispense:  30 tablet    Refill:  3    Take with 1 capsule of (40 mg total) by mouth to total 60 mg daily.    Order Specific Question:   Supervising Provider    Answer:   Tresa Garter [id number]  . labetalol (  NORMODYNE) 300 MG tablet    Sig: Take 2 tablets (600 mg total) by mouth 2 (two) times daily.    Dispense:  120 tablet    Refill:  3    Order Specific Question:   Supervising Provider    Answer:   Tresa Garter [id number]  . NIFEdipine (PROCARDIA XL/ADALAT-CC) 90 MG 24 hr tablet    Sig: Take 1 tablet (90 mg total) by mouth daily.    Dispense:  90 tablet    Refill:  0    Order Specific Question:   Supervising Provider    Answer:   Tresa Garter [id number]    Follow-up: Return in about 8 weeks (around 11/07/2016) for Anxiety .   Alfonse Spruce FNP

## 2016-09-12 NOTE — Patient Instructions (Addendum)
Cas management and will contact you with additional resources available.  Take medications with applesauce, small cup of fruit, or pudding instead of milk. You will be called with your labs results.   DASH Eating Plan DASH stands for "Dietary Approaches to Stop Hypertension." The DASH eating plan is a healthy eating plan that has been shown to reduce high blood pressure (hypertension). It may also reduce your risk for type 2 diabetes, heart disease, and stroke. The DASH eating plan may also help with weight loss. What are tips for following this plan? General guidelines   Avoid eating more than 2,300 mg (milligrams) of salt (sodium) a day. If you have hypertension, you may need to reduce your sodium intake to 1,500 mg a day.  Limit alcohol intake to no more than 1 drink a day for nonpregnant women and 2 drinks a day for men. One drink equals 12 oz of beer, 5 oz of wine, or 1 oz of hard liquor.  Work with your health care provider to maintain a healthy body weight or to lose weight. Ask what an ideal weight is for you.  Get at least 30 minutes of exercise that causes your heart to beat faster (aerobic exercise) most days of the week. Activities may include walking, swimming, or biking.  Work with your health care provider or diet and nutrition specialist (dietitian) to adjust your eating plan to your individual calorie needs. Reading food labels   Check food labels for the amount of sodium per serving. Choose foods with less than 5 percent of the Daily Value of sodium. Generally, foods with less than 300 mg of sodium per serving fit into this eating plan.  To find whole grains, look for the word "whole" as the first word in the ingredient list. Shopping   Buy products labeled as "low-sodium" or "no salt added."  Buy fresh foods. Avoid canned foods and premade or frozen meals. Cooking   Avoid adding salt when cooking. Use salt-free seasonings or herbs instead of table salt or sea salt.  Check with your health care provider or pharmacist before using salt substitutes.  Do not fry foods. Cook foods using healthy methods such as baking, boiling, grilling, and broiling instead.  Cook with heart-healthy oils, such as olive, canola, soybean, or sunflower oil. Meal planning    Eat a balanced diet that includes:  5 or more servings of fruits and vegetables each day. At each meal, try to fill half of your plate with fruits and vegetables.  Up to 6-8 servings of whole grains each day.  Less than 6 oz of lean meat, poultry, or fish each day. A 3-oz serving of meat is about the same size as a deck of cards. One egg equals 1 oz.  2 servings of low-fat dairy each day.  A serving of nuts, seeds, or beans 5 times each week.  Heart-healthy fats. Healthy fats called Omega-3 fatty acids are found in foods such as flaxseeds and coldwater fish, like sardines, salmon, and mackerel.  Limit how much you eat of the following:  Canned or prepackaged foods.  Food that is high in trans fat, such as fried foods.  Food that is high in saturated fat, such as fatty meat.  Sweets, desserts, sugary drinks, and other foods with added sugar.  Full-fat dairy products.  Do not salt foods before eating.  Try to eat at least 2 vegetarian meals each week.  Eat more home-cooked food and less restaurant, buffet, and fast food.  When eating at a restaurant, ask that your food be prepared with less salt or no salt, if possible. What foods are recommended? The items listed may not be a complete list. Talk with your dietitian about what dietary choices are best for you. Grains  Whole-grain or whole-wheat bread. Whole-grain or whole-wheat pasta. Brown rice. Orpah Cobbatmeal. Quinoa. Bulgur. Whole-grain and low-sodium cereals. Pita bread. Low-fat, low-sodium crackers. Whole-wheat flour tortillas. Vegetables  Fresh or frozen vegetables (raw, steamed, roasted, or grilled). Low-sodium or reduced-sodium tomato and  vegetable juice. Low-sodium or reduced-sodium tomato sauce and tomato paste. Low-sodium or reduced-sodium canned vegetables. Fruits  All fresh, dried, or frozen fruit. Canned fruit in natural juice (without added sugar). Meat and other protein foods  Skinless chicken or Malaw[place]urkey. Ground chicken or [place]urkey. Pork with fat trimmed off. Fish and seafood. Egg whites. Dried beans, peas, or lentils. Unsalted nuts, nut butters, and seeds. Unsalted canned beans. Lean cuts of beef with fat trimmed off. Low-sodium, lean deli meat. Dairy  Low-fat (1%) or fat-free (skim) milk. Fat-free, low-fat, or reduced-fat cheeses. Nonfat, low-sodium ricotta or cottage cheese. Low-fat or nonfat yogurt. Low-fat, low-sodium cheese. Fats and oils  Soft margarine without trans fats. Vegetable oil. Low-fat, reduced-fat, or light mayonnaise and salad dressings (reduced-sodium). Canola, safflower, olive, soybean, and sunflower oils. Avocado. Seasoning and other foods  Herbs. Spices. Seasoning mixes without salt. Unsalted popcorn and pretzels. Fat-free sweets. What foods are not recommended? The items listed may not be a complete list. Talk with your dietitian about what dietary choices are best for you. Grains  Baked goods made with fat, such as croissants, muffins, or some breads. Dry pasta or rice meal packs. Vegetables  Creamed or fried vegetables. Vegetables in a cheese sauce. Regular canned vegetables (not low-sodium or reduced-sodium). Regular canned tomato sauce and paste (not low-sodium or reduced-sodium). Regular tomato and vegetable juice (not low-sodium or reduced-sodium). Rosita FirePickles. Olives. Fruits  Canned fruit in a light or heavy syrup. Fried fruit. Fruit in cream or butter sauce. Meat and other protein foods  Fatty cuts of meat. Ribs. Fried meat. Tomasa BlaseBacon. Sausage. Bologna and other processed lunch meats. Salami. Fatback. Hotdogs. Bratwurst. Salted nuts and seeds. Canned beans with added salt. Canned or smoked fish. Whole  eggs or egg yolks. Chicken or Mal[place]urkey with skin. Dairy  Whole or 2% milk, cream, and half-and-half. Whole or full-fat cream cheese. Whole-fat or sweetened yogurt. Full-fat cheese. Nondairy creamers. Whipped toppings. Processed cheese and cheese spreads. Fats and oils  Butter. Stick margarine. Lard. Shortening. Ghee. Bacon fat. Tropical oils, such as coconut, palm kernel, or palm oil. Seasoning and other foods  Salted popcorn and pretzels. Onion salt, garlic salt, seasoned salt, table salt, and sea salt. Worcestershire sauce. Tartar sauce. Barbecue sauce. Teriyaki sauce. Soy sauce, including reduced-sodium. Steak sauce. Canned and packaged gravies. Fish sauce. Oyster sauce. Cocktail sauce. Horseradish that you find on the shelf. Ketchup. Mustard. Meat flavorings and tenderizers. Bouillon cubes. Hot sauce and Tabasco sauce. Premade or packaged marinades. Premade or packaged taco seasonings. Relishes. Regular salad dressings. Where to find more information:  National Heart, Lung, and Blood Institute: PopSteam.iswww.nhlbi.nih.gov  American Heart Association: www.heart.org Summary  The DASH eating plan is a healthy eating plan that has been shown to reduce high blood pressure (hypertension). It may also reduce your risk for type 2 diabetes, heart disease, and stroke.  With the DASH eating plan, you should limit salt (sodium) intake to 2,300 mg a day. If you have hypertension, you may need to reduce your  sodium intake to 1,500 mg a day.  When on the DASH eating plan, aim to eat more fresh fruits and vegetables, whole grains, lean proteins, low-fat dairy, and heart-healthy fats.  Work with your health care provider or diet and nutrition specialist (dietitian) to adjust your eating plan to your individual calorie needs. This information is not intended to replace advice given to you by your health care provider. Make sure you discuss any questions you have with your health care provider. Document Released:  04/18/2011 Document Revised: 04/22/2016 Document Reviewed: 04/22/2016 Elsevier Interactive Patient Education  2017 [employer].

## 2016-09-13 LAB — CMP14+EGFR
ALBUMIN: 4.2 g/dL (ref 3.5–5.5)
ALT: 12 IU/L (ref 0–32)
AST: 14 IU/L (ref 0–40)
Albumin/Globulin Ratio: 1.6 (ref 1.2–2.2)
Alkaline Phosphatase: 94 IU/L (ref 39–117)
BUN/Creatinine Ratio: 11 (ref 9–23)
BUN: 9 mg/dL (ref 6–20)
Bilirubin Total: 0.3 mg/dL (ref 0.0–1.2)
CO2: 21 mmol/L (ref 18–29)
CREATININE: 0.79 mg/dL (ref 0.57–1.00)
Calcium: 9.9 mg/dL (ref 8.7–10.2)
Chloride: 102 mmol/L (ref 96–106)
GFR calc Af Amer: 115 mL/min/{1.73_m2} (ref >59)
GFR calc non Af Amer: 99 mL/min/{1.73_m2} (ref >59)
Globulin, Total: 2.7 g/dL (ref 1.5–4.5)
Glucose: 82 mg/dL (ref 65–99)
Potassium: 4.4 mmol/L (ref 3.5–5.2)
Sodium: 137 mmol/L (ref 134–144)
Total Protein: 6.9 g/dL (ref 6.0–8.5)

## 2016-09-16 ENCOUNTER — Telehealth: Payer: Self-pay

## 2016-09-16 ENCOUNTER — Telehealth: Payer: Self-pay | Admitting: Family Medicine

## 2016-09-16 NOTE — Telephone Encounter (Signed)
Message received from Arrie SenateMandesia Hairston, FNP requesting this CM contact the patient about resources for the homeless. Call placed to # [phone] (M) and a HIPAA compliant voicemail message was left requesting a call back to # [phone].

## 2016-09-16 NOTE — Telephone Encounter (Signed)
PT called to return your call can you please call her back
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
## 2016-09-18 ENCOUNTER — Telehealth: Payer: Self-pay

## 2016-09-18 ENCOUNTER — Encounter: Payer: Self-pay | Admitting: Family Medicine

## 2016-09-18 NOTE — Telephone Encounter (Signed)
-----   Message from Lizbeth BarkMandesia R Hairston, FNP sent at 09/18/2016  1:17 PM EDT ----- Liver function normal Kidney function normal

## 2016-09-18 NOTE — Telephone Encounter (Signed)
CMA call regarding lab results   Patient did not answer but left a detailed message  & if have any questions just to call back   

## 2016-09-18 NOTE — Telephone Encounter (Signed)
Okay thank you

## 2016-10-09 ENCOUNTER — Other Ambulatory Visit: Payer: Self-pay | Admitting: Family Medicine

## 2016-10-09 DIAGNOSIS — F329 Major depressive disorder, single episode, unspecified: Secondary | ICD-10-CM

## 2016-10-09 DIAGNOSIS — F419 Anxiety disorder, unspecified: Principal | ICD-10-CM

## 2016-10-09 MED FILL — FLUoxetine HCL 20 MG CAPS: 20 | 30 days supply | Qty: 30 | Fill #1

## 2016-10-09 MED FILL — FLUoxetine HCL 40 MG CAPS: 40 | 30 days supply | Qty: 30 | Fill #1

## 2016-10-09 MED FILL — NIFEDIPINE ER 90 MG TABLET: 90 | 30 days supply | Qty: 30 | Fill #1 | Status: TO

## 2016-10-09 MED FILL — busPIRone HCL 15 MG TABS: 15 | 30 days supply | Qty: 60 | Fill #0

## 2016-10-18 ENCOUNTER — Other Ambulatory Visit: Payer: Self-pay | Admitting: Family Medicine

## 2016-10-18 DIAGNOSIS — I10 Essential (primary) hypertension: Secondary | ICD-10-CM

## 2016-10-30 MED FILL — LABETALOL HCL 300 MG TABLET: 300 | 30 days supply | Qty: 120 | Fill #3

## 2016-11-07 ENCOUNTER — Ambulatory Visit: Payer: Self-pay | Admitting: Family Medicine

## 2016-11-07 ENCOUNTER — Ambulatory Visit: Payer: Self-pay | Attending: Family Medicine | Admitting: Family Medicine

## 2016-11-07 VITALS — BP 127/76 | HR 66 | Temp 98.7°F | Resp 18 | Ht 62.0 in | Wt 180.4 lb

## 2016-11-07 DIAGNOSIS — I10 Essential (primary) hypertension: Secondary | ICD-10-CM | POA: Insufficient documentation

## 2016-11-07 DIAGNOSIS — F419 Anxiety disorder, unspecified: Secondary | ICD-10-CM | POA: Insufficient documentation

## 2016-11-07 DIAGNOSIS — F329 Major depressive disorder, single episode, unspecified: Secondary | ICD-10-CM | POA: Insufficient documentation

## 2016-11-07 DIAGNOSIS — R5383 Other fatigue: Secondary | ICD-10-CM | POA: Insufficient documentation

## 2016-11-07 DIAGNOSIS — Z8709 Personal history of other diseases of the respiratory system: Secondary | ICD-10-CM

## 2016-11-07 MED ORDER — ALBUTEROL SULFATE HFA 108 (90 BASE) MCG/ACT IN AERS: 2.0000 | INHALATION_SPRAY | Freq: Four times a day (QID) | RESPIRATORY_TRACT | 2 refills | Status: DC | PRN

## 2016-11-07 MED ORDER — NIFEDIPINE ER OSMOTIC RELEASE 90 MG PO TB24: 90.0000 mg | ORAL_TABLET | Freq: Every day | ORAL | 1 refills | Status: DC

## 2016-11-07 NOTE — Patient Instructions (Signed)
Apply for orange card to complete referral process.   Living With Anxiety After being diagnosed with an anxiety disorder, you may be relieved to know why you have felt or behaved a certain way. It is natural to also feel overwhelmed about the treatment ahead and what it will mean for your life. With care and support, you can manage this condition and recover from it. How to cope with anxiety Dealing with stress Stress is your body's reaction to life changes and events, both good and bad. Stress can last just a few hours or it can be ongoing. Stress can play a major role in anxiety, so it is important to learn both how to cope with stress and how to think about it differently. Talk with your health care provider or a counselor to learn more about stress reduction. He or she may suggest some stress reduction techniques, such as:  Music therapy. This can include creating or listening to music that you enjoy and that inspires you.  Mindfulness-based meditation. This involves being aware of your normal breaths, rather than trying to control your breathing. It can be done while sitting or walking.  Centering prayer. This is a kind of meditation that involves focusing on a word, phrase, or sacred image that is meaningful to you and that brings you peace.  Deep breathing. To do this, expand your stomach and inhale slowly through your nose. Hold your breath for 3-5 seconds. Then exhale slowly, allowing your stomach muscles to relax.  Self-talk. This is a skill where you identify thought patterns that lead to anxiety reactions and correct those thoughts.  Muscle relaxation. This involves tensing muscles then relaxing them.  Choose a stress reduction technique that fits your lifestyle and personality. Stress reduction techniques take time and practice. Set aside 5-15 minutes a day to do them. Therapists can offer training in these techniques. The training may be covered by some insurance plans. Other  things you can do to manage stress include:  Keeping a stress diary. This can help you learn what triggers your stress and ways to control your response.  Thinking about how you respond to certain situations. You may not be able to control everything, but you can control your reaction.  Making time for activities that help you relax, and not feeling guilty about spending your time in this way.  Therapy combined with coping and stress-reduction skills provides the best chance for successful treatment. Medicines Medicines can help ease symptoms. Medicines for anxiety include:  Anti-anxiety drugs.  Antidepressants.  Beta-blockers.  Medicines may be used as the main treatment for anxiety disorder, along with therapy, or if other treatments are not working. Medicines should be prescribed by a health care provider. Relationships Relationships can play a big part in helping you recover. Try to spend more time connecting with trusted friends and family members. Consider going to couples counseling, taking family education classes, or going to family therapy. Therapy can help you and others better understand the condition. How to recognize changes in your condition Everyone has a different response to treatment for anxiety. Recovery from anxiety happens when symptoms decrease and stop interfering with your daily activities at home or work. This may mean that you will start to:  Have better concentration and focus.  Sleep better.  Be less irritable.  Have more energy.  Have improved memory.  It is important to recognize when your condition is getting worse. Contact your health care provider if your symptoms interfere with home  or work and you do not feel like your condition is improving. Where to find help and support: You can get help and support from these sources:  Self-help groups.  Online and [employer] organizations.  A trusted spiritual leader.  Couples counseling.  Family  education classes.  Family therapy.  Follow these instructions at home:  Eat a healthy diet that includes plenty of vegetables, fruits, whole grains, low-fat dairy products, and lean protein. Do not eat a lot of foods that are high in solid fats, added sugars, or salt.  Exercise. Most adults should do the following: ? Exercise for at least 150 minutes each week. The exercise should increase your heart rate and make you sweat (moderate-intensity exercise). ? Strengthening exercises at least twice a week.  Cut down on caffeine, tobacco, alcohol, and other potentially harmful substances.  Get the right amount and quality of sleep. Most adults need 7-9 hours of sleep each night.  Make choices that simplify your life.  Take over-the-counter and prescription medicines only as told by your health care provider.  Avoid caffeine, alcohol, and certain over-the-counter cold medicines. These may make you feel worse. Ask your pharmacist which medicines to avoid.  Keep all follow-up visits as told by your health care provider. This is important. Questions to ask your health care provider  Would I benefit from therapy?  How often should I follow up with a health care provider?  How long do I need to take medicine?  Are there any long-term side effects of my medicine?  Are there any alternatives to taking medicine? Contact a health care provider if:  You have a hard time staying focused or finishing daily tasks.  You spend many hours a day feeling worried about everyday life.  You become exhausted by worry.  You start to have headaches, feel tense, or have nausea.  You urinate more than normal.  You have diarrhea. Get help right away if:  You have a racing heart and shortness of breath.  You have thoughts of hurting yourself or others. If you ever feel like you may hurt yourself or others, or have thoughts about taking your own life, get help right away. You can go to your nearest  emergency department or call:  Your local emergency services (911 in the [place]  A suicide crisis helpline, such as the National Suicide Prevention Lifeline at 850-101-54071-415-588-5797. This is open 24-hours a day.  Summary  Taking steps to deal with stress can help calm you.  Medicines cannot cure anxiety disorders, but they can help ease symptoms.  Family, friends, and partners can play a big part in helping you recover from an anxiety disorder. This information is not intended to replace advice given to you by your health care provider. Make sure you discuss any questions you have with your health care provider. Document Released: 04/23/2016 Document Revised: 04/23/2016 Document Reviewed: 04/23/2016 Elsevier Interactive Patient Education  2018 [employer]  Living With Depression Everyone experiences occasional disappointment, sadness, and loss in their lives. When you are feeling down, blue, or sad for at least 2 weeks in a row, it may mean that you have depression. Depression can affect your thoughts and feelings, relationships, daily activities, and physical health. It is caused by changes in the way your brain functions. If you receive a diagnosis of depression, your health care provider will tell you which type of depression you have and what treatment options are available to you. If you are living with depression, there  are ways to help you recover from it and also ways to prevent it from coming back. How to cope with lifestyle changes Coping with stress Stress is your body's reaction to life changes and events, both good and bad. Stressful situations may include:  Getting married.  The death of a spouse.  Losing a job.  Retiring.  Having a baby.  Stress can last just a few hours or it can be ongoing. Stress can play a major role in depression, so it is important to learn both how to cope with stress and how to think about it differently. Talk with your health care provider or a  counselor if you would like to learn more about stress reduction. He or she may suggest some stress reduction techniques, such as:  Music therapy. This can include creating music or listening to music. Choose music that you enjoy and that inspires you.  Mindfulness-based meditation. This kind of meditation can be done while sitting or walking. It involves being aware of your normal breaths, rather than trying to control your breathing.  Centering prayer. This is a kind of meditation that involves focusing on a spiritual word or phrase. Choose a word, phrase, or sacred image that is meaningful to you and that brings you peace.  Deep breathing. To do this, expand your stomach and inhale slowly through your nose. Hold your breath for 3-5 seconds, then exhale slowly, allowing your stomach muscles to relax.  Muscle relaxation. This involves intentionally tensing muscles then relaxing them.  Choose a stress reduction technique that fits your lifestyle and personality. Stress reduction techniques take time and practice to develop. Set aside 5-15 minutes a day to do them. Therapists can offer training in these techniques. The training may be covered by some insurance plans. Other things you can do to manage stress include:  Keeping a stress diary. This can help you learn what triggers your stress and ways to control your response.  Understanding what your limits are and saying no to requests or events that lead to a schedule that is too full.  Thinking about how you respond to certain situations. You may not be able to control everything, but you can control how you react.  Adding humor to your life by watching funny films or TV shows.  Making time for activities that help you relax and not feeling guilty about spending your time this way.  Medicines Your health care provider may suggest certain medicines if he or she feels that they will help improve your condition. Avoid using alcohol and other  substances that may prevent your medicines from working properly (may interact). It is also important to:  Talk with your pharmacist or health care provider about all the medicines that you take, their possible side effects, and what medicines are safe to take together.  Make it your goal to take part in all treatment decisions (shared decision-making). This includes giving input on the side effects of medicines. It is best if shared decision-making with your health care provider is part of your total treatment plan.  If your health care provider prescribes a medicine, you may not notice the full benefits of it for 4-8 weeks. Most people who are treated for depression need to be on medicine for at least 6-12 months after they feel better. If you are taking medicines as part of your treatment, do not stop taking medicines without first talking to your health care provider. You may need to have the medicine slowly  decreased (tapered) over time to decrease the risk of harmful side effects. Relationships Your health care provider may suggest family therapy along with individual therapy and drug therapy. While there may not be family problems that are causing you to feel depressed, it is still important to make sure your family learns as much as they can about your mental health. Having your family's support can help make your treatment successful. How to recognize changes in your condition Everyone has a different response to treatment for depression. Recovery from major depression happens when you have not had signs of major depression for two months. This may mean that you will start to:  Have more interest in doing activities.  Feel less hopeless than you did 2 months ago.  Have more energy.  Overeat less often, or have better or improving appetite.  Have better concentration.  Your health care provider will work with you to decide the next steps in your recovery. It is also important to  recognize when your condition is getting worse. Watch for these signs:  Having fatigue or low energy.  Eating too much or too little.  Sleeping too much or too little.  Feeling restless, agitated, or hopeless.  Having trouble concentrating or making decisions.  Having unexplained physical complaints.  Feeling irritable, angry, or aggressive.  Get help as soon as you or your family members notice these symptoms coming back. How to get support and help from others How to talk with friends and family members about your condition Talking to friends and family members about your condition can provide you with one way to get support and guidance. Reach out to trusted friends or family members, explain your symptoms to them, and let them know that you are working with a health care provider to treat your depression. Financial resources Not all insurance plans cover mental health care, so it is important to check with your insurance carrier. If paying for co-pays or counseling services is a problem, search for a local or county mental health care center. They may be able to offer public mental health care services at low or no cost when you are not able to see a private health care provider. If you are taking medicine for depression, you may be able to get the generic form, which may be less expensive. Some makers of prescription medicines also offer help to patients who cannot afford the medicines they need. Follow these instructions at home:  Get the right amount and quality of sleep.  Cut down on using caffeine, tobacco, alcohol, and other potentially harmful substances.  Try to exercise, such as walking or lifting small weights.  Take over-the-counter and prescription medicines only as told by your health care provider.  Eat a healthy diet that includes plenty of vegetables, fruits, whole grains, low-fat dairy products, and lean protein. Do not eat a lot of foods that are high in solid  fats, added sugars, or salt.  Keep all follow-up visits as told by your health care provider. This is important. Contact a health care provider if:  You stop taking your antidepressant medicines, and you have any of these symptoms: ? Nausea. ? Headache. ? Feeling lightheaded. ? Chills and body aches. ? Not being able to sleep (insomnia).  You or your friends and family think your depression is getting worse. Get help right away if:  You have thoughts of hurting yourself or others. If you ever feel like you may hurt yourself or others, or have thoughts  about taking your own life, get help right away. You can go to your nearest emergency department or call:  Your local emergency services (911 in the [place]  A suicide crisis helpline, such as the National Suicide Prevention Lifeline at (480)364-9790. This is open 24-hours a day.  Summary  If you are living with depression, there are ways to help you recover from it and also ways to prevent it from coming back.  Work with your health care team to create a management plan that includes counseling, stress management techniques, and healthy lifestyle habits. This information is not intended to replace advice given to you by your health care provider. Make sure you discuss any questions you have with your health care provider. Document Released: 04/01/2016 Document Revised: 04/01/2016 Document Reviewed: 04/01/2016 Elsevier Interactive Patient Education  [employer].

## 2016-11-07 NOTE — Progress Notes (Signed)
Patient is here for f/up anxiety

## 2016-11-07 NOTE — Progress Notes (Signed)
Subjective:  Patient ID: Kelli Wells, female    DOB: Jul 14, 1985  Age: 31 y.o. MRN: [id number]  CC: Follow-up   HPI [name] presents for anxiety/depression and HTN follow up. Patient has history of anxiety disorder and depression.  She has the following symptoms: difficulty concentrating, racing thoughts, and crying. Onset of symptoms was approximately several years ago, stable since that time. She denies current suicidal and homicidal ideation. Family history significant for no psychiatric illness.Possible organic causes contributing are: none. Risk factors: previous episode of depression. Previous treatment includes BuSpar and Prozac. She denies follow up with counseling resources. She complains of the following side effects from the treatment: none. She does report fatigue for several months. She denies any unexplained weight loss, poor appetite, hematochezia or melena. Reports sleeping 12 to 13 hours on average on the weekends.  Blood pressure is well controlled at home. Cardiac symptoms none. Patient denies chest pain, claudication, dyspnea, palpitations and syncope.  Cardiovascular risk factors: hypertension, obesity (BMI >= 30 kg/m2), sedentary lifestyle and smoking/ tobacco exposure. Use of agents associated with hypertension: none. History of target organ damage: none.   Outpatient Medications Prior to Visit  Medication Sig Dispense Refill  . acetaminophen (TYLENOL) 500 MG tablet Take 1,000 mg by mouth every 6 (six) hours as needed for mild pain.    . busPIRone (BUSPAR) 15 MG tablet TAKE 1 TABLET BY MOUTH 2 TIMES DAILY. 60 tablet 0  . FLUoxetine (PROZAC) 20 MG tablet Take 1 tablet (20 mg total) by mouth daily. Take with 1 capsule of (40 mg total) by mouth to total 60 mg daily. 30 tablet 3  . FLUoxetine (PROZAC) 40 MG capsule Take 1 capsule (40 mg total) by mouth daily. Take with 1 capsule of (20 mg total) by mouth to total 60 mg daily. 30 capsule 3  . labetalol (NORMODYNE) 300  MG tablet Take 2 tablets (600 mg total) by mouth 2 (two) times daily. 120 tablet 3  . Prenatal Vit-Fe Fumarate-FA (PRENATAL MULTIVITAMIN) TABS tablet Take 1 tablet by mouth daily at 12 noon.    . ranitidine (ZANTAC) 150 MG tablet Take 150 mg by mouth 4 (four) times daily.    Marland Kitchen albuterol (PROVENTIL HFA;VENTOLIN HFA) 108 (90 BASE) MCG/ACT inhaler Inhale 2 puffs into the lungs every 6 (six) hours as needed for wheezing or shortness of breath.    Marland Kitchen NIFEdipine (PROCARDIA XL/ADALAT-CC) 90 MG 24 hr tablet Take 1 tablet (90 mg total) by mouth daily. 90 tablet 0  . norethindrone (CAMILA) 0.35 MG tablet Take 1 tablet (0.35 mg total) by mouth daily. 1 Package 6   No facility-administered medications prior to visit.     ROS Review of Systems  Constitutional: Positive for fatigue.  HENT: Negative.   Eyes: Negative.   Respiratory: Negative.   Cardiovascular: Negative.   Gastrointestinal: Negative.   Musculoskeletal: Negative.   Skin: Negative.   Psychiatric/Behavioral: Negative for suicidal ideas.       History of anxiety and depression    Objective:  BP 127/76 (BP Location: Left Arm, Patient Position: Sitting, Cuff Size: Normal)   Pulse 66   Temp 98.7 F (37.1 C) (Oral)   Resp 18   Ht 5\' 2"  (1.575 m)   Wt 180 lb 6.4 oz (81.8 kg)   SpO2 98%   BMI 33.00 kg/m   BP/Weight 11/07/2016 09/12/2016 07/03/2016  Systolic BP 127 122 166  Diastolic BP 76 74 98  Wt. (Lbs) 180.4 183.2 -  BMI 33 32.45 -  Physical Exam  Constitutional: She appears well-developed and well-nourished.  HENT:  Head: Normocephalic and atraumatic.  Right Ear: External ear normal.  Left Ear: External ear normal.  Nose: Nose normal.  Mouth/Throat: Oropharynx is clear and moist.  Eyes: Conjunctivae are normal. Pupils are equal, round, and reactive to light.  Neck: No JVD present.  Cardiovascular: Normal rate, regular rhythm, normal heart sounds and intact distal pulses.   Pulmonary/Chest: Effort normal and breath sounds  normal.  Abdominal: Soft. Bowel sounds are normal.  Skin: Skin is warm and dry.  Psychiatric: She expresses no homicidal and no suicidal ideation. She expresses no suicidal plans and no homicidal plans.  Nursing note and vitals reviewed.   Assessment & Plan:   Problem List Items Addressed This Visit      Cardiovascular and Mediastinum   Hypertension   Relevant Medications   NIFEdipine (PROCARDIA XL/ADALAT-CC) 90 MG 24 hr tablet     Other   Anxiety and depression   Relevant Orders   Ambulatory referral to Psychiatry    Other Visit Diagnoses    Fatigue, unspecified type    -  Primary   Relevant Orders   CBC with Differential (Completed)   TSH (Completed)   History of asthma       Relevant Medications   albuterol (PROVENTIL HFA;VENTOLIN HFA) 108 (90 Base) MCG/ACT inhaler      Meds ordered this encounter  Medications  . albuterol (PROVENTIL HFA;VENTOLIN HFA) 108 (90 Base) MCG/ACT inhaler    Sig: Inhale 2 puffs into the lungs every 6 (six) hours as needed for wheezing or shortness of breath.    Dispense:  1 Inhaler    Refill:  2    Order Specific Question:   Supervising Provider    Answer:   Quentin AngstJEGEDE, OLUGBEMIGA E [id number]  . NIFEdipine (PROCARDIA XL/ADALAT-CC) 90 MG 24 hr tablet    Sig: Take 1 tablet (90 mg total) by mouth daily.    Dispense:  90 tablet    Refill:  1    Order Specific Question:   Supervising Provider    Answer:   Quentin AngstJEGEDE, OLUGBEMIGA E [id number]    Follow-up: Return in about 3 months (around 02/07/2017) for HTN/Anxiety/Dep. [name].   [name] FNP

## 2016-11-08 LAB — CBC WITH DIFFERENTIAL/PLATELET
BASOS: 0 %
Basophils Absolute: 0 10*3/uL (ref 0.0–0.2)
EOS (ABSOLUTE): 0.2 10*3/uL (ref 0.0–0.4)
EOS: 4 %
HEMATOCRIT: 39.1 % (ref 34.0–46.6)
HEMOGLOBIN: 13.1 g/dL (ref 11.1–15.9)
IMMATURE GRANULOCYTES: 0 %
Immature Grans (Abs): 0 10*3/uL (ref 0.0–0.1)
Lymphocytes Absolute: 2.8 10*3/uL (ref 0.7–3.1)
Lymphs: 52 %
MCH: 29 pg (ref 26.6–33.0)
MCHC: 33.5 g/dL (ref 31.5–35.7)
MCV: 87 fL (ref 79–97)
MONOCYTES: 14 %
Monocytes Absolute: 0.7 10*3/uL (ref 0.1–0.9)
NEUTROS PCT: 30 %
Neutrophils Absolute: 1.6 10*3/uL (ref 1.4–7.0)
Platelets: 355 10*3/uL (ref 150–379)
RBC: 4.51 x10E6/uL (ref 3.77–5.28)
RDW: 13.7 % (ref 12.3–15.4)
WBC: 5.3 10*3/uL (ref 3.4–10.8)

## 2016-11-08 LAB — TSH: TSH: 0.885 u[IU]/mL (ref 0.450–4.500)

## 2016-11-08 MED FILL — NIFEDIPINE ER 90 MG TABLET: 90 | 30 days supply | Qty: 30 | Fill #0

## 2016-11-08 MED FILL — ?FLUOXETINE HCL 20 MG CAP: 20 | 30 days supply | Qty: 30 | Fill #2 | Status: TO

## 2016-11-08 MED FILL — VENTOLIN HFA 90 MCG INHALER: 108 (90 BAS | 25 days supply | Qty: 18 | Fill #0

## 2016-11-08 MED FILL — FLUoxetine HCL 40 MG CAPS: 40 | 30 days supply | Qty: 30 | Fill #2 | Status: TO

## 2016-11-08 MED FILL — busPIRone HCL 15 MG TABS: 15 | 30 days supply | Qty: 60 | Fill #3

## 2016-11-12 ENCOUNTER — Telehealth: Payer: Self-pay

## 2016-11-12 NOTE — Telephone Encounter (Signed)
CMA call regarding lab results  Patient did not answer but left a detailed message per DPR & to call back

## 2016-11-12 NOTE — Telephone Encounter (Signed)
-----   Message from Lizbeth BarkMandesia R Hairston, FNP sent at 11/12/2016  9:54 AM EDT ----- Labs that evaluated your blood cells is normal. No signs of anemia, acute infection, or inflammation present. Thyroid function normal. When thyroid is underfunctioning it can cause fatigue. Follow up with referral.

## 2016-12-05 ENCOUNTER — Other Ambulatory Visit: Payer: Self-pay | Admitting: Family Medicine

## 2016-12-05 DIAGNOSIS — F419 Anxiety disorder, unspecified: Principal | ICD-10-CM

## 2016-12-05 DIAGNOSIS — F329 Major depressive disorder, single episode, unspecified: Secondary | ICD-10-CM

## 2016-12-13 MED FILL — LABETALOL HCL 300 MG TABLET: 300 | 30 days supply | Qty: 120 | Fill #0

## 2016-12-13 MED FILL — NIFEDIPINE ER 90 MG TABLET: 90 | 30 days supply | Qty: 30 | Fill #1

## 2016-12-13 MED FILL — FLUoxetine HCL 20 MG CAPS: 20 | 30 days supply | Qty: 30 | Fill #0

## 2016-12-13 MED FILL — FLUoxetine HCL 40 MG CAPS: 40 | 30 days supply | Qty: 30 | Fill #0

## 2016-12-13 MED FILL — busPIRone HCL 15 MG TABS: 15 | 30 days supply | Qty: 60 | Fill #0

## 2017-01-15 MED FILL — FLUoxetine HCL 20 MG CAPS: 20 | 30 days supply | Qty: 30 | Fill #1

## 2017-01-15 MED FILL — FLUoxetine HCL 40 MG CAPS: 40 | 30 days supply | Qty: 30 | Fill #1

## 2017-01-15 MED FILL — busPIRone HCL 15 MG TABS: 15 | 30 days supply | Qty: 60 | Fill #1

## 2017-01-22 MED FILL — NIFEDIPINE ER 90 MG TABLET: 90 | 30 days supply | Qty: 30 | Fill #2

## 2017-02-04 MED FILL — LABETALOL HCL 300 MG TABLET: 300 | 30 days supply | Qty: 120 | Fill #1

## 2017-02-07 ENCOUNTER — Ambulatory Visit: Payer: Self-pay | Attending: Family Medicine | Admitting: Family Medicine

## 2017-02-07 ENCOUNTER — Encounter: Payer: Self-pay | Admitting: Family Medicine

## 2017-02-07 VITALS — BP 111/72 | HR 71 | Temp 98.3°F | Resp 18 | Ht 64.0 in | Wt 176.6 lb

## 2017-02-07 DIAGNOSIS — L918 Other hypertrophic disorders of the skin: Secondary | ICD-10-CM | POA: Insufficient documentation

## 2017-02-07 DIAGNOSIS — F419 Anxiety disorder, unspecified: Secondary | ICD-10-CM | POA: Insufficient documentation

## 2017-02-07 DIAGNOSIS — K5900 Constipation, unspecified: Secondary | ICD-10-CM | POA: Insufficient documentation

## 2017-02-07 DIAGNOSIS — Z79899 Other long term (current) drug therapy: Secondary | ICD-10-CM | POA: Insufficient documentation

## 2017-02-07 DIAGNOSIS — I10 Essential (primary) hypertension: Secondary | ICD-10-CM | POA: Insufficient documentation

## 2017-02-07 DIAGNOSIS — Z6830 Body mass index (BMI) 30.0-30.9, adult: Secondary | ICD-10-CM | POA: Insufficient documentation

## 2017-02-07 DIAGNOSIS — F329 Major depressive disorder, single episode, unspecified: Secondary | ICD-10-CM | POA: Insufficient documentation

## 2017-02-07 DIAGNOSIS — E669 Obesity, unspecified: Secondary | ICD-10-CM | POA: Insufficient documentation

## 2017-02-07 DIAGNOSIS — Z23 Encounter for immunization: Secondary | ICD-10-CM | POA: Insufficient documentation

## 2017-02-07 MED ORDER — NIFEDIPINE ER OSMOTIC RELEASE 90 MG PO TB24: 90.0000 mg | ORAL_TABLET | Freq: Every day | ORAL | 3 refills | Status: DC

## 2017-02-07 MED ORDER — LABETALOL HCL 300 MG PO TABS
600.0000 mg | ORAL_TABLET | Freq: Two times a day (BID) | ORAL | 3 refills | Status: DC
Start: 2017-02-07 — End: 2017-09-02

## 2017-02-07 MED ORDER — FLUOXETINE HCL 20 MG PO CAPS: ORAL_CAPSULE | ORAL | 3 refills | Status: DC

## 2017-02-07 MED ORDER — BUSPIRONE HCL 15 MG PO TABS: 15.0000 mg | ORAL_TABLET | Freq: Two times a day (BID) | ORAL | 3 refills | Status: DC

## 2017-02-07 MED ORDER — FLUOXETINE HCL 40 MG PO CAPS: ORAL_CAPSULE | ORAL | 3 refills | Status: DC

## 2017-02-07 MED ORDER — DOCUSATE SODIUM 100 MG PO CAPS: 100.0000 mg | ORAL_CAPSULE | Freq: Every day | ORAL | 0 refills | Status: DC | PRN

## 2017-02-07 NOTE — Patient Instructions (Addendum)
Apply for orange card.   Dysphoria Dysphoria is a condition in which a person feels unpleasant or uncomfortable. It may involve mood changes and feelings of sadness, anxiety, irritability, and restlessness. Dysphoria is often caused by normal life stress and it usually goes away within several days. Dysphoria that lasts longer than several days may be a symptom of a mental disorder, such as major depression or bipolar disorder. Follow these instructions at home: Monitor your mood for any changes. Take these steps to help with your discomfort and unpleasant feelings:  Take over-the-counter and prescription medicines only as told by your health care provider.  Check with your health care provider before taking any herbs or supplements.  Keep all follow-up visits as told by your health care provider.This is important.  Maintain a healthy lifestyle. ? Eat a healthy diet. ? Exercise regularly. ? Get plenty of sleep.  Avoid alcohol and drugs.  Learn ways to reduce stress and cope with stress, such as with yoga and meditation.  Talk about your feelings with family members or health care providers.  Make time for yourself to do things that you enjoy.  Contact a health care provider if:  You were given medicine and it does not seem to be helping.  You feel hopeless and overwhelmed.  You feel like you cannot leave your house.  You have trouble taking care of yourself. Get help right away if:  You have serious thoughts about hurting yourself or others. This information is not intended to replace advice given to you by your health care provider. Make sure you discuss any questions you have with your health care provider. Document Released: 10/08/2005 Document Revised: 10/05/2015 Document Reviewed: 03/12/2014 Elsevier Interactive Patient Education  2018 Elsevier Inc.  Influenza Virus Vaccine injection What is this medicine? INFLUENZA VIRUS VACCINE (in floo EN zuh VAHY ruhs vak  SEEN) helps to reduce the risk of getting influenza also known as the flu. The vaccine only helps protect you against some strains of the flu. This medicine may be used for other purposes; ask your health care provider or pharmacist if you have questions. COMMON BRAND NAME(S): Afluria, Agriflu, Alfuria, FLUAD, Fluarix, Fluarix Quadrivalent, Flublok, Flublok Quadrivalent, FLUCELVAX, Flulaval, Fluvirin, Fluzone, Fluzone High-Dose, Fluzone Intradermal What should I tell my health care provider before I take this medicine? They need to know if you have any of these conditions: -bleeding disorder like hemophilia -fever or infection -Guillain-Barre syndrome or other neurological problems -immune system problems -infection with the human immunodeficiency virus (HIV) or AIDS -low blood platelet counts -multiple sclerosis -an unusual or allergic reaction to influenza virus vaccine, latex, other medicines, foods, dyes, or preservatives. Different brands of vaccines contain different allergens. Some may contain latex or eggs. Talk to your doctor about your allergies to make sure that you get the right vaccine. -pregnant or trying to get pregnant -breast-feeding How should I use this medicine? This vaccine is for injection into a muscle or under the skin. It is given by a health care professional. A copy of Vaccine Information Statements will be given before each vaccination. Read this sheet carefully each time. The sheet may change frequently. Talk to your healthcare provider to see which vaccines are right for you. Some vaccines should not be used in all age groups. Overdosage: If you think you have taken too much of this medicine contact a poison control center or emergency room at once. NOTE: This medicine is only for you. Do not share this medicine with others.  What if I miss a dose? This does not apply. What may interact with this medicine? -chemotherapy or radiation therapy -medicines that lower  your immune system like etanercept, anakinra, infliximab, and adalimumab -medicines that treat or prevent blood clots like warfarin -phenytoin -steroid medicines like prednisone or cortisone -theophylline -vaccines This list may not describe all possible interactions. Give your health care provider a list of all the medicines, herbs, non-prescription drugs, or dietary supplements you use. Also tell them if you smoke, drink alcohol, or use illegal drugs. Some items may interact with your medicine. What should I watch for while using this medicine? Report any side effects that do not go away within 3 days to your doctor or health care professional. Call your health care provider if any unusual symptoms occur within 6 weeks of receiving this vaccine. You may still catch the flu, but the illness is not usually as bad. You cannot get the flu from the vaccine. The vaccine will not protect against colds or other illnesses that may cause fever. The vaccine is needed every year. What side effects may I notice from receiving this medicine? Side effects that you should report to your doctor or health care professional as soon as possible: -allergic reactions like skin rash, itching or hives, swelling of the face, lips, or tongue Side effects that usually do not require medical attention (report to your doctor or health care professional if they continue or are bothersome): -fever -headache -muscle aches and pains -pain, tenderness, redness, or swelling at the injection site -tiredness This list may not describe all possible side effects. Call your doctor for medical advice about side effects. You may report side effects to FDA at 1-800-FDA-1088. Where should I keep my medicine? The vaccine will be given by a health care professional in a clinic, pharmacy, doctor's office, or other health care setting. You will not be given vaccine doses to store at home. NOTE: This sheet is a summary. It may not cover all  possible information. If you have questions about this medicine, talk to your doctor, pharmacist, or health care provider.  2018 Elsevier/Gold Standard (2014-11-18 10:07:28)

## 2017-02-07 NOTE — Progress Notes (Signed)
Patient is here for f/up anxiety & dep  Patient has a concern about a mole: left shoulder

## 2017-02-13 NOTE — Progress Notes (Signed)
Subjective:  Patient ID: Kelli Wells, female    DOB: Oct 05, 1985  Age: 31 y.o. MRN: [id number]  CC: Follow-up   HPI [name]  presents for anxiety/depression and HTN follow up. Patient has history of anxiety disorder and depression.  She has the following symptoms: difficulty concentrating, racing thoughts, and crying. Onset of symptoms was approximately several years ago, stable since that time. She denies current suicidal and homicidal ideation. Family history significant for no psychiatric illness.Possible organic causes contributing are: none. Risk factors: previous episode of depression. Previous treatment includes BuSpar and Prozac. She denies follow up with counseling resources. She complains of the following side effects from the treatment: none.  Blood pressure is well controlled at home. Cardiac symptoms none. Patient denies chest pain, claudication, dyspnea, palpitations and syncope.  Cardiovascular risk factors: hypertension, obesity (BMI >= 30 kg/m2), sedentary lifestyle and smoking/ tobacco exposure. Use of agents associated with hypertension: none. History of target organ damage: none. History of constipation. She reports  complains of constipation. Denies eating a high fiber diet, she does report adequate water intake. She is not exercising. She is not taking anything for symptoms. She complains of lesion to left shoulder. Onset more than 1 year ago. She reports attempting to squeeze and drain lesion in the past. Denies any current drainage, pain, increase in size, or color changes.   Outpatient Medications Prior to Visit  Medication Sig Dispense Refill  . acetaminophen (TYLENOL) 500 MG tablet Take 1,000 mg by mouth every 6 (six) hours as needed for mild pain.    Marland Kitchen albuterol (PROVENTIL HFA;VENTOLIN HFA) 108 (90 Base) MCG/ACT inhaler Inhale 2 puffs into the lungs every 6 (six) hours as needed for wheezing or shortness of breath. 1 Inhaler 2  . Prenatal Vit-Fe Fumarate-FA  (PRENATAL MULTIVITAMIN) TABS tablet Take 1 tablet by mouth daily at 12 noon.    . ranitidine (ZANTAC) 150 MG tablet Take 150 mg by mouth 4 (four) times daily.    . busPIRone (BUSPAR) 15 MG tablet TAKE 1 TABLET BY MOUTH 2 TIMES DAILY. 60 tablet 3  . FLUoxetine (PROZAC) 20 MG capsule TAKE 1 TABLET BY MOUTH DAILY. TAKE WITH 1 CAPSULE OF (40 MG TOTAL) BY MOUTH TO TOTAL 60 MG DAILY. 30 capsule 3  . FLUoxetine (PROZAC) 40 MG capsule TAKE 1 CAPSULE BY MOUTH DAILY. TAKE WITH 1 CAPSULE OF (20 MG TOTAL) BY MOUTH TO TOTAL 60 MG DAILY. 30 capsule 3  . labetalol (NORMODYNE) 300 MG tablet Take 2 tablets (600 mg total) by mouth 2 (two) times daily. 120 tablet 3  . NIFEdipine (PROCARDIA XL/ADALAT-CC) 90 MG 24 hr tablet Take 1 tablet (90 mg total) by mouth daily. 90 tablet 1   No facility-administered medications prior to visit.     ROS Review of Systems  Constitutional: Negative.   HENT: Negative.   Eyes: Negative.   Respiratory: Negative.   Cardiovascular: Negative.   Gastrointestinal: Positive for constipation.  Musculoskeletal: Negative.   Skin:       Lesion   Psychiatric/Behavioral: Negative for suicidal ideas.       History of anxiety and depression    Objective:  BP 111/72 (BP Location: Left Arm, Patient Position: Sitting, Cuff Size: Normal)   Pulse 71   Temp 98.3 F (36.8 C) (Oral)   Resp 18   Ht  (1.626 m)   Wt 176 lb 9.6 oz (80.1 kg)   SpO2 99%   BMI 30.31 kg/m   BP/Weight 02/07/2017 11/07/2016 09/12/2016  Systolic  BP 111 127 122  Diastolic BP 72 76 74  Wt. (Lbs) 176.6 180.4 183.2  BMI 30.31 33 32.45    Physical Exam  Constitutional: She appears well-developed and well-nourished.  HENT:  Head: Normocephalic and atraumatic.  Right Ear: External ear normal.  Left Ear: External ear normal.  Nose: Nose normal.  Mouth/Throat: Oropharynx is clear and moist.  Eyes: Pupils are equal, round, and reactive to light. Conjunctivae are normal.  Neck: No JVD present.    Cardiovascular: Normal rate, regular rhythm, normal heart sounds and intact distal pulses.   Pulmonary/Chest: Effort normal and breath sounds normal.  Abdominal: Soft. Bowel sounds are normal. There is no tenderness.  Skin: Skin is warm and dry. Lesion: raised mole to left shoulder area,  no irregular borders, no color changes; skin tag present to bilateral neck areas.  Psychiatric: She expresses no homicidal and no suicidal ideation. She expresses no suicidal plans and no homicidal plans.  Nursing note and vitals reviewed.   Assessment & Plan:   1. Essential hypertension  - NIFEdipine (PROCARDIA XL/ADALAT-CC) 90 MG 24 hr tablet; Take 1 tablet (90 mg total) by mouth daily.  Dispense: 90 tablet; Refill: 3 - labetalol (NORMODYNE) 300 MG tablet; Take 2 tablets (600 mg total) by mouth 2 (two) times daily.  Dispense: 120 tablet; Refill: 3  2. Anxiety and depression  - FLUoxetine (PROZAC) 40 MG capsule; TAKE 1 CAPSULE BY MOUTH DAILY. TAKE WITH 1 CAPSULE OF (20 MG TOTAL) BY MOUTH TO TOTAL 60 MG DAILY.  Dispense: 30 capsule; Refill: 3 - FLUoxetine (PROZAC) 20 MG capsule; TAKE 1 TABLET BY MOUTH DAILY. TAKE WITH 1 CAPSULE OF (40 MG TOTAL) BY MOUTH TO TOTAL 60 MG DAILY.  Dispense: 30 capsule; Refill: 3 - busPIRone (BUSPAR) 15 MG tablet; Take 1 tablet (15 mg total) by mouth 2 (two) times daily.  Dispense: 60 tablet; Refill: 3  3. Constipation, unspecified constipation type  - docusate sodium (COLACE) 100 MG capsule; Take 1 capsule (100 mg total) by mouth daily as needed for mild constipation or moderate constipation.  Dispense: 30 capsule; Refill: 0  4. Cutaneous skin tags  - Ambulatory referral to Dermatology  5. Needs flu shot  - Flu Vaccine QUAD 6+ mos PF IM (Fluarix Quad PF)   Meds ordered this encounter  Medications  . docusate sodium (COLACE) 100 MG capsule    Sig: Take 1 capsule (100 mg total) by mouth daily as needed for mild constipation or moderate constipation.    Dispense:  30  capsule    Refill:  0    Order Specific Question:   Supervising Provider    Answer:   Quentin Angst [id number]  . FLUoxetine (PROZAC) 40 MG capsule    Sig: TAKE 1 CAPSULE BY MOUTH DAILY. TAKE WITH 1 CAPSULE OF (20 MG TOTAL) BY MOUTH TO TOTAL 60 MG DAILY.    Dispense:  30 capsule    Refill:  3    Order Specific Question:   Supervising Provider    Answer:   Quentin Angst [id number]  . FLUoxetine (PROZAC) 20 MG capsule    Sig: TAKE 1 TABLET BY MOUTH DAILY. TAKE WITH 1 CAPSULE OF (40 MG TOTAL) BY MOUTH TO TOTAL 60 MG DAILY.    Dispense:  30 capsule    Refill:  3    Order Specific Question:   Supervising Provider    Answer:   Quentin Angst [id number]  . NIFEdipine (PROCARDIA XL/ADALAT-CC) 90 MG 24 hr tablet  Sig: Take 1 tablet (90 mg total) by mouth daily.    Dispense:  90 tablet    Refill:  3    Order Specific Question:   Supervising Provider    Answer:   Quentin Angst [id number]  . labetalol (NORMODYNE) 300 MG tablet    Sig: Take 2 tablets (600 mg total) by mouth 2 (two) times daily.    Dispense:  120 tablet    Refill:  3    Order Specific Question:   Supervising Provider    Answer:   Quentin Angst [id number]  . busPIRone (BUSPAR) 15 MG tablet    Sig: Take 1 tablet (15 mg total) by mouth 2 (two) times daily.    Dispense:  60 tablet    Refill:  3    Order Specific Question:   Supervising Provider    Answer:   Quentin Angst [id number]    Follow-up: Return in about 3 months (around 05/09/2017), or if symptoms worsen or fail to improve, for HTN/Anxiety/Depression.   [name] FNP

## 2017-02-25 MED FILL — NIFEDIPINE ER 90 MG TABLET: 90 | 30 days supply | Qty: 30 | Fill #3

## 2017-02-25 MED FILL — FLUoxetine HCL 20 MG CAPS: 20 | 30 days supply | Qty: 30 | Fill #2

## 2017-02-25 MED FILL — FLUoxetine HCL 40 MG CAPS: 40 | 30 days supply | Qty: 30 | Fill #2

## 2017-02-27 IMAGING — US US ABDOMEN LIMITED
1 series · 14 of 25 positions shown · non-contrast
Comparison: None.

CLINICAL DATA: Acute onset of right upper quadrant abdominal pain.
Initial encounter.

EXAM:
US ABDOMEN LIMITED - RIGHT UPPER QUADRANT

[Series 1: us abdomen limited · 0.20mm/px · 14 of 57 slices shown]
[im 1/57]
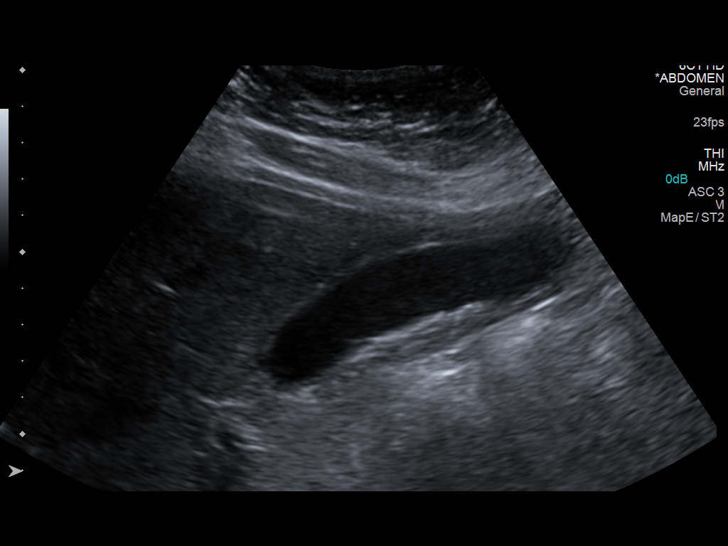
[im 5/57]
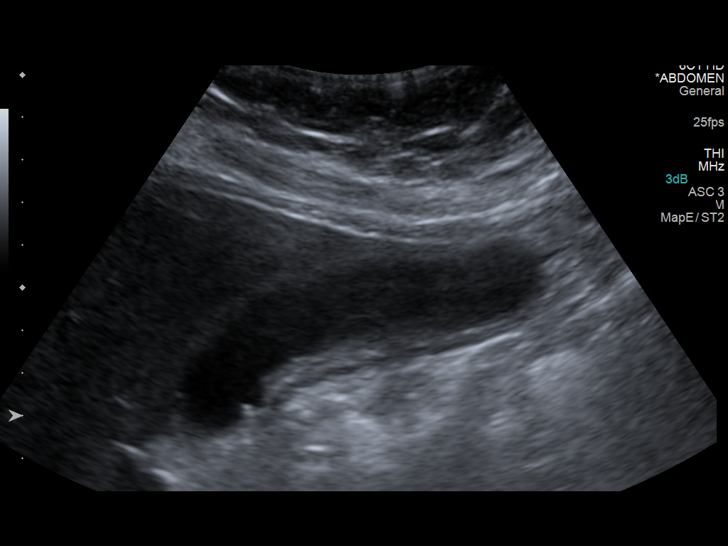
[im 10/57]
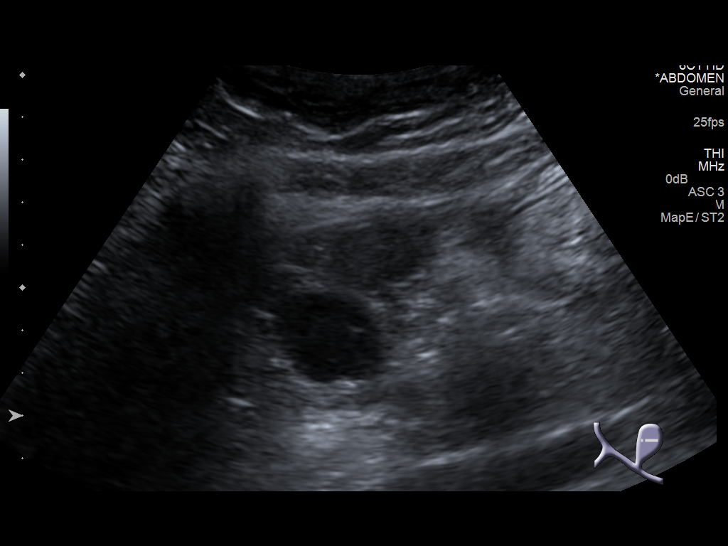
[im 15/57]
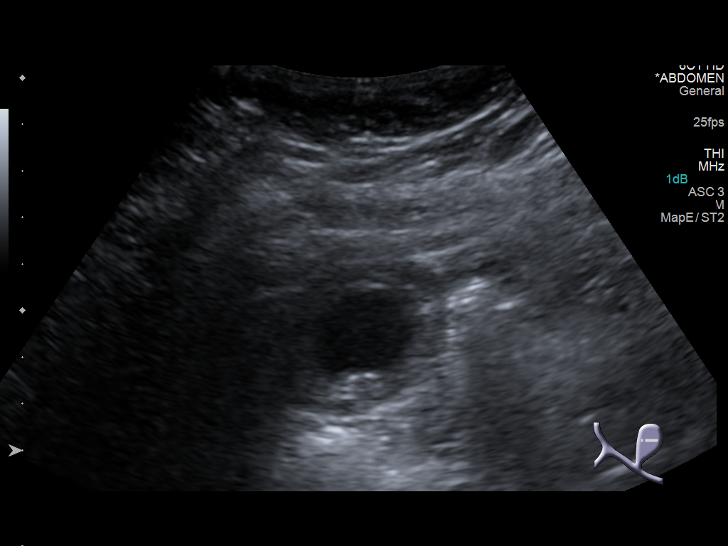
[im 19/57]
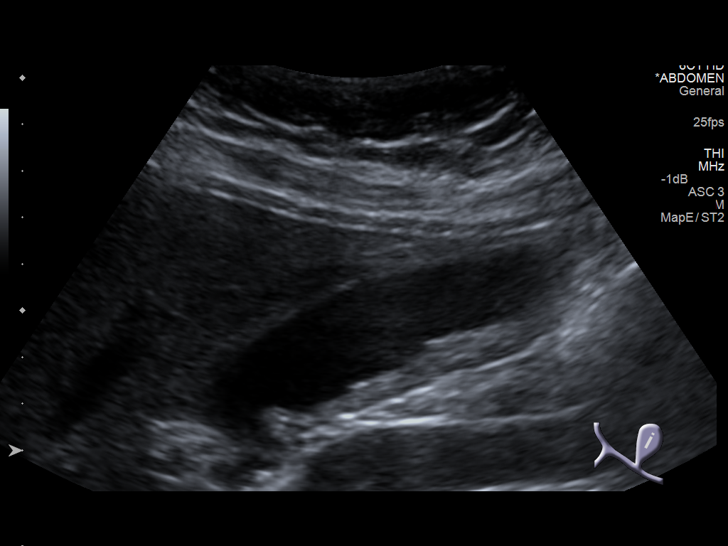
[im 22/57]
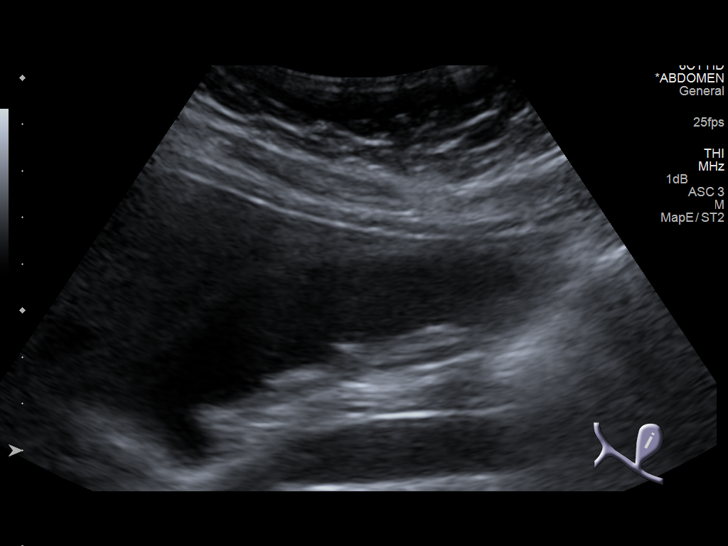
[im 26/57]
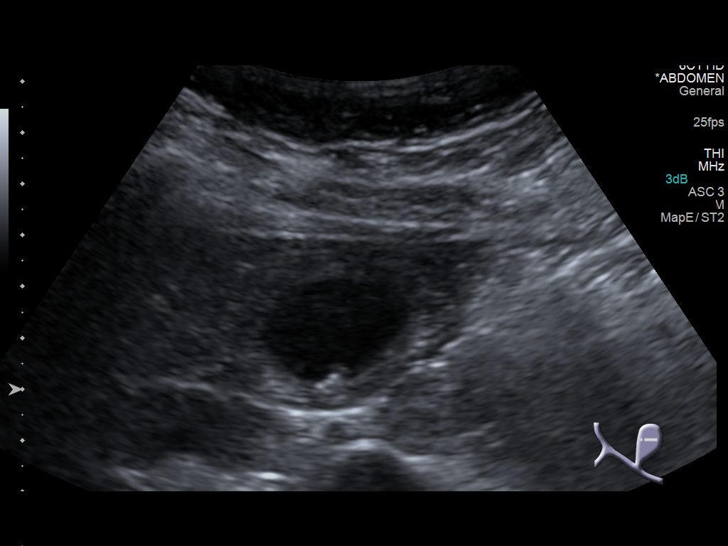
[im 31/57]
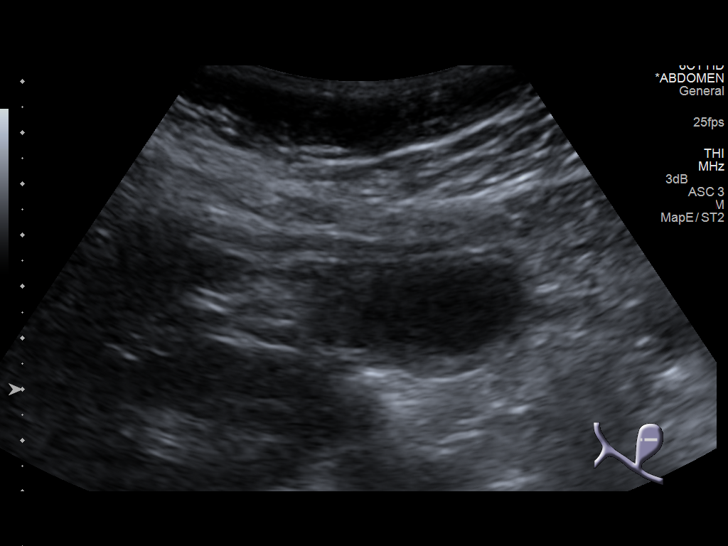
[im 36/57]
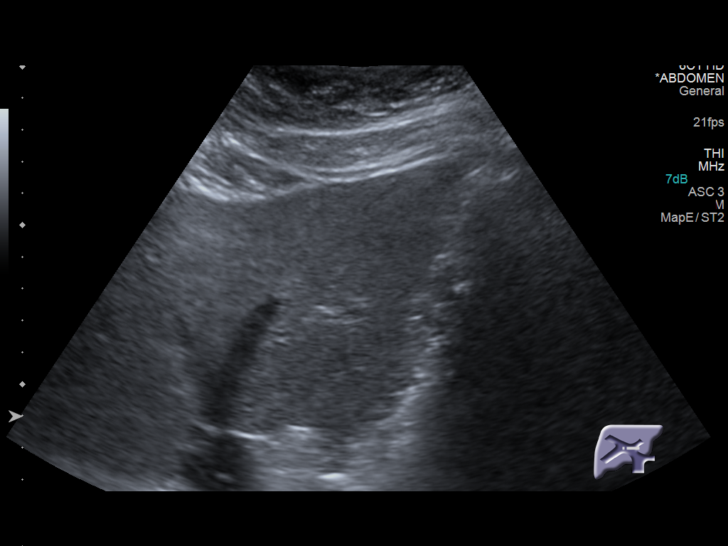
[im 38/57]
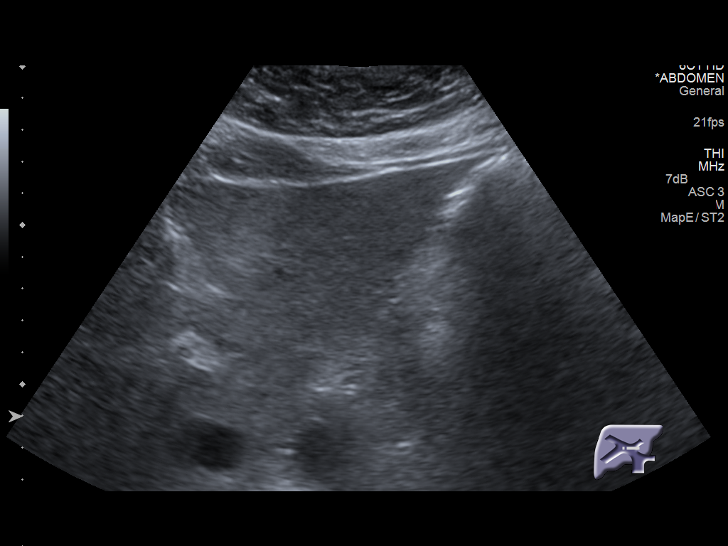
[im 43/57]
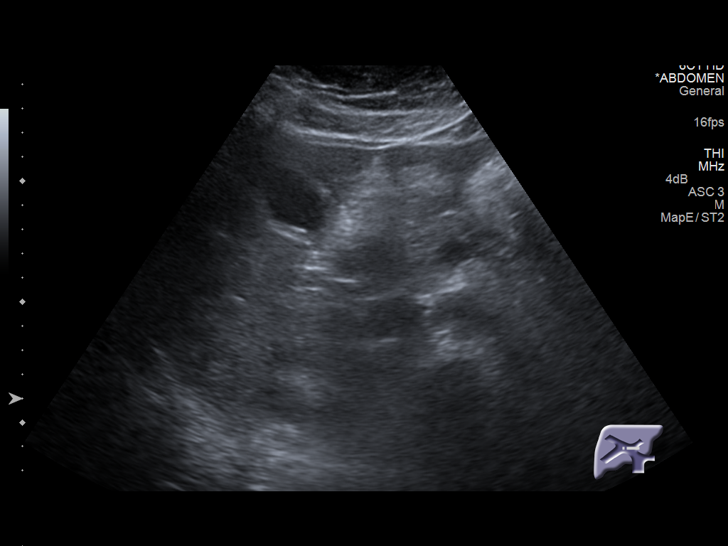
[im 47/57]
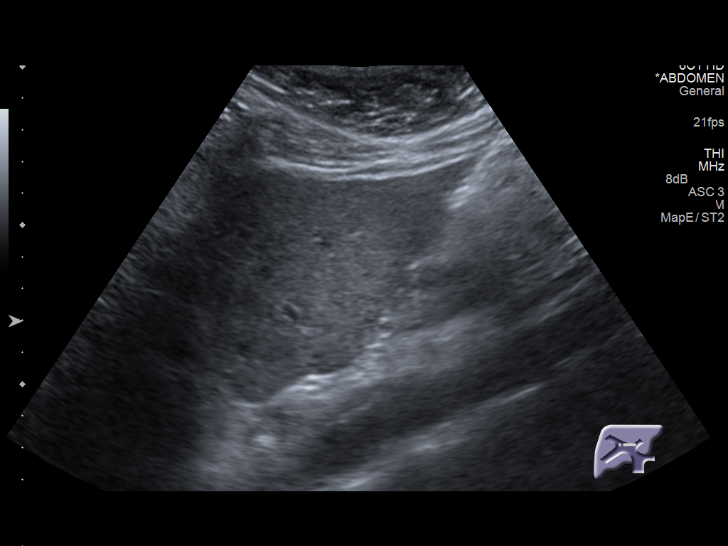
[im 52/57]
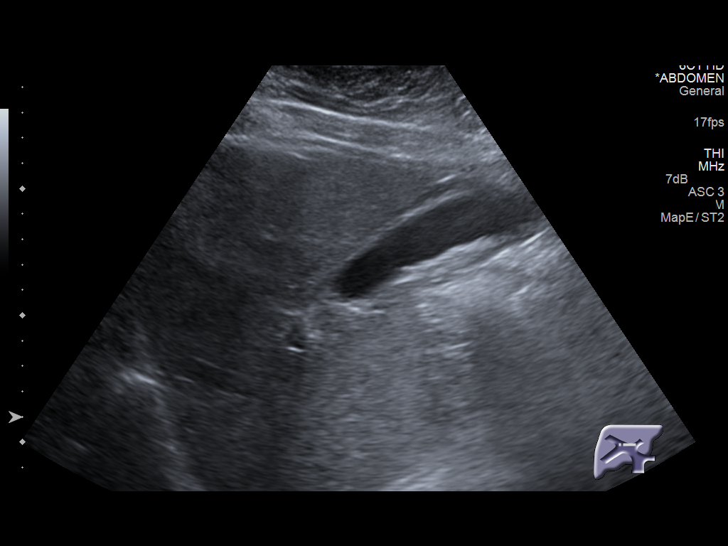
[im 57/57]
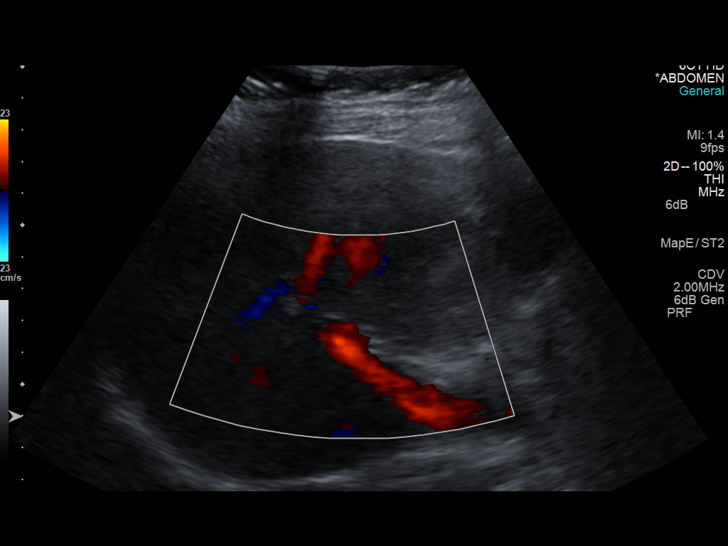

[14 of 25 positions shown; findings below may reference images not displayed]

FINDINGS: Gallbladder:

Diffuse gallbladder wall thickening is noted, measuring up to 4 mm,
with a small amount of pericholecystic fluid. Stones are seen
layering dependently within the gallbladder, measuring up to 1.1 cm
in size. Evaluation for ultrasonographic Murphy's sign is limited,
as the patient has been given pain medication.

Common bile duct:

Diameter: 0.3 cm, within normal limits in caliber.

Liver:

No focal lesion identified. Within normal limits in parenchymal
echogenicity.
IMPRESSION: Diffuse gallbladder wall thickening, with a small amount of
pericholecystic fluid. Underlying cholelithiasis noted. This raises
concern for acute cholecystitis. Evaluation for ultrasonographic
Murphy's sign is limited, as the patient has been given pain
medication.

## 2017-03-10 MED FILL — busPIRone HCL 15 MG TABS: 15 | 30 days supply | Qty: 60 | Fill #0

## 2017-03-28 MED FILL — NIFEDIPINE ER 90 MG TABLET: 90 | 30 days supply | Qty: 30 | Fill #4 | Status: TO

## 2017-03-28 MED FILL — FLUoxetine HCL 40 MG CAPS: 40 | 30 days supply | Qty: 30 | Fill #3

## 2017-04-01 NOTE — Telephone Encounter (Signed)
This encounter was created in error - please disregard.

## 2017-04-09 MED FILL — LABETALOL HCL 300 MG TABLET: 300 | 30 days supply | Qty: 120 | Fill #2

## 2017-04-09 MED FILL — busPIRone HCL 15 MG TABS: 15 | 30 days supply | Qty: 60 | Fill #1

## 2017-04-09 MED FILL — FLUoxetine HCL 20 MG CAPS: 20 | 30 days supply | Qty: 30 | Fill #3

## 2017-04-28 MED FILL — FLUoxetine HCL 40 MG CAPS: 40 | 30 days supply | Qty: 30 | Fill #0

## 2017-04-28 MED FILL — ?NIFEDIPINE ER 90 MG TABLET: 90 | 30 days supply | Qty: 30 | Fill #0

## 2017-04-28 MED FILL — FLUoxetine HCL 20 MG CAPS: 20 | 30 days supply | Qty: 30 | Fill #0

## 2017-05-16 ENCOUNTER — Ambulatory Visit: Payer: Self-pay | Attending: Family Medicine | Admitting: Family Medicine

## 2017-05-16 ENCOUNTER — Encounter: Payer: Self-pay | Admitting: Family Medicine

## 2017-05-16 VITALS — BP 105/65 | HR 74 | Temp 98.3°F | Resp 18 | Ht 64.0 in | Wt 177.0 lb

## 2017-05-16 DIAGNOSIS — N898 Other specified noninflammatory disorders of vagina: Secondary | ICD-10-CM

## 2017-05-16 DIAGNOSIS — Z79899 Other long term (current) drug therapy: Secondary | ICD-10-CM | POA: Insufficient documentation

## 2017-05-16 DIAGNOSIS — F329 Major depressive disorder, single episode, unspecified: Secondary | ICD-10-CM | POA: Insufficient documentation

## 2017-05-16 DIAGNOSIS — I10 Essential (primary) hypertension: Secondary | ICD-10-CM | POA: Insufficient documentation

## 2017-05-16 DIAGNOSIS — Z792 Long term (current) use of antibiotics: Secondary | ICD-10-CM

## 2017-05-16 DIAGNOSIS — Z09 Encounter for follow-up examination after completed treatment for conditions other than malignant neoplasm: Secondary | ICD-10-CM

## 2017-05-16 LAB — POCT URINALYSIS DIPSTICK
Bilirubin, UA: NEGATIVE
Glucose, UA: NEGATIVE
KETONES UA: NEGATIVE
LEUKOCYTES UA: NEGATIVE
NITRITE UA: NEGATIVE
PH UA: 8.5 — AB (ref 5.0–8.0)
Protein, UA: 30
SPEC GRAV UA: 1.01 (ref 1.010–1.025)
UROBILINOGEN UA: 0.2 U/dL

## 2017-05-16 MED ORDER — FLUCONAZOLE 150 MG PO TABS: 150.0000 mg | ORAL_TABLET | Freq: Once | ORAL | 0 refills | Status: AC

## 2017-05-16 NOTE — Progress Notes (Signed)
Subjective:  Patient ID: Kelli Wells, female    DOB: 22-Nov-1985  Age: 32 y.o. MRN: [id number]  CC: Follow-up   HPI [name] presents for c/o vaginal discharge and vaginal irritation. Onset a few day ago.  Described is white, thick, non-malodorous. She reports history of chronic BV with prophylactic metronidazole use.  She reports taking 4 pills of metronidazole once a month every month for 6 months.  She is agreeable to STI testing she reports one sexual partner within the last 3 months.  History of hypertension.  BP controlled on current medications.  She denies any cardiac symptoms.  History of anxiety and depression.  She reports adherence with depressant and antianxiety medications..  She denies any SI/HI.  She declines needing any medication refills.   Outpatient Medications Prior to Visit  Medication Sig Dispense Refill  . acetaminophen (TYLENOL) 500 MG tablet Take 1,000 mg by mouth every 6 (six) hours as needed for mild pain.    Marland Kitchen albuterol (PROVENTIL HFA;VENTOLIN HFA) 108 (90 Base) MCG/ACT inhaler Inhale 2 puffs into the lungs every 6 (six) hours as needed for wheezing or shortness of breath. 1 Inhaler 2  . busPIRone (BUSPAR) 15 MG tablet Take 1 tablet (15 mg total) by mouth 2 (two) times daily. 60 tablet 3  . docusate sodium (COLACE) 100 MG capsule Take 1 capsule (100 mg total) by mouth daily as needed for mild constipation or moderate constipation. 30 capsule 0  . FLUoxetine (PROZAC) 20 MG capsule TAKE 1 TABLET BY MOUTH DAILY. TAKE WITH 1 CAPSULE OF (40 MG TOTAL) BY MOUTH TO TOTAL 60 MG DAILY. 30 capsule 3  . FLUoxetine (PROZAC) 40 MG capsule TAKE 1 CAPSULE BY MOUTH DAILY. TAKE WITH 1 CAPSULE OF (20 MG TOTAL) BY MOUTH TO TOTAL 60 MG DAILY. 30 capsule 3  . labetalol (NORMODYNE) 300 MG tablet Take 2 tablets (600 mg total) by mouth 2 (two) times daily. 120 tablet 3  . NIFEdipine (PROCARDIA XL/ADALAT-CC) 90 MG 24 hr tablet Take 1 tablet (90 mg total) by mouth daily. 90 tablet 3   . Prenatal Vit-Fe Fumarate-FA (PRENATAL MULTIVITAMIN) TABS tablet Take 1 tablet by mouth daily at 12 noon.    . ranitidine (ZANTAC) 150 MG tablet Take 150 mg by mouth 4 (four) times daily.     No facility-administered medications prior to visit.     ROS Review of Systems  Constitutional: Negative.   Respiratory: Negative.   Cardiovascular: Negative.   Gastrointestinal: Negative.   Genitourinary: Positive for vaginal discharge. Negative for dysuria and menstrual problem.  Skin: Negative.   Psychiatric/Behavioral: Negative for suicidal ideas.   Objective:  BP 105/65 (BP Location: Left Arm, Patient Position: Sitting, Cuff Size: Normal)   Pulse 74   Temp 98.3 F (36.8 C) (Oral)   Resp 18   Ht 5\' 4"  (1.626 m)   Wt 177 lb (80.3 kg)   SpO2 99%   BMI 30.38 kg/m   BP/Weight 05/16/2017 02/07/2017 11/07/2016  Systolic BP 105 111 127  Diastolic BP 65 72 76  Wt. (Lbs) 177 176.6 180.4  BMI 30.38 30.31 33     Physical Exam  Constitutional: She appears well-developed and well-nourished.  Cardiovascular: Normal rate, regular rhythm, normal heart sounds and intact distal pulses.  Pulmonary/Chest: Effort normal and breath sounds normal.  Abdominal: Soft. Bowel sounds are normal. There is no tenderness.  Genitourinary:  Genitourinary Comments: Urine cytology  Skin: Skin is warm and dry.  Nursing note and vitals reviewed.  Assessment & Plan:   1. Vaginal discharge  - Urine cytology ancillary only - Urinalysis Dipstick - fluconazole (DIFLUCAN) 150 MG tablet; Take 1 tablet (150 mg total) by mouth once for 1 dose.  Dispense: 1 tablet; Refill: 0  2. Antibiotic long-term use  - fluconazole (DIFLUCAN) 150 MG tablet; Take 1 tablet (150 mg total) by mouth once for 1 dose.  Dispense: 1 tablet; Refill: 0  3. Follow up -Continue current medications.      Follow-up: Return if symptoms worsen or fail to improve.   Lizbeth BarkMandesia R Sharrell Krawiec FNP

## 2017-05-16 NOTE — Patient Instructions (Signed)

## 2017-05-20 LAB — URINE CYTOLOGY ANCILLARY ONLY
CHLAMYDIA, DNA PROBE: NEGATIVE
NEISSERIA GONORRHEA: NEGATIVE
Trichomonas: NEGATIVE

## 2017-05-22 LAB — URINE CYTOLOGY ANCILLARY ONLY
Bacterial vaginitis: POSITIVE — AB
CANDIDA VAGINITIS: POSITIVE — AB

## 2017-05-26 ENCOUNTER — Telehealth: Payer: Self-pay | Admitting: *Deleted

## 2017-05-26 NOTE — Telephone Encounter (Signed)
Pt name and DOB verified. Pt aware of results.  Pt states she needs medications : Diflucan and [facility]

## 2017-05-26 NOTE — Telephone Encounter (Signed)
Lizbeth BarkHairston, Mandesia R, FNP  [place], [place], CMA        Positive for bv and yeast. Complete diflucan therapy as prescribed. Continue to take metronidazole for chronic BV.    Left message to return call on voicemail.

## 2017-05-27 MED FILL — FLUoxetine HCL 40 MG CAPS: 40 | 30 days supply | Qty: 30 | Fill #1

## 2017-05-27 MED FILL — busPIRone HCL 15 MG TABS: 15 | 30 days supply | Qty: 60 | Fill #2

## 2017-05-27 MED FILL — LABETALOL HCL 300 MG TABLET: 300 | 30 days supply | Qty: 120 | Fill #3

## 2017-05-27 MED FILL — ?NIFEDIPINE ER 90 MG TABLET: 90 | 30 days supply | Qty: 30 | Fill #1

## 2017-05-27 NOTE — Telephone Encounter (Signed)
Script for diflucan was given 05/16/17 date office visit. Per patient she is currently taking course of metronidazole therapy for chronic BV.

## 2017-06-27 MED FILL — ?NIFEDIPINE ER 90 MG TABLET: 90 | 30 days supply | Qty: 30 | Fill #2 | Status: TO

## 2017-06-27 MED FILL — FLUoxetine HCL 40 MG CAPS: 40 | 30 days supply | Qty: 30 | Fill #2 | Status: TO

## 2017-06-27 MED FILL — FLUoxetine HCL 20 MG CAPS: 20 | 30 days supply | Qty: 30 | Fill #1

## 2017-07-14 MED FILL — busPIRone HCL 15 MG TABS: 15 | 30 days supply | Qty: 60 | Fill #3

## 2017-07-28 MED FILL — FLUoxetine HCL 20 MG CAPS: 20 | 30 days supply | Qty: 30 | Fill #2 | Status: TO

## 2017-07-30 ENCOUNTER — Ambulatory Visit: Payer: Self-pay | Attending: Internal Medicine | Admitting: Physician Assistant

## 2017-07-30 ENCOUNTER — Encounter: Payer: Self-pay | Admitting: Family Medicine

## 2017-07-30 VITALS — BP 139/88 | HR 69 | Temp 98.3°F | Resp 16 | Wt 171.2 lb

## 2017-07-30 DIAGNOSIS — J45909 Unspecified asthma, uncomplicated: Secondary | ICD-10-CM | POA: Insufficient documentation

## 2017-07-30 DIAGNOSIS — L02828 Furuncle of other sites: Secondary | ICD-10-CM | POA: Insufficient documentation

## 2017-07-30 DIAGNOSIS — F419 Anxiety disorder, unspecified: Secondary | ICD-10-CM | POA: Insufficient documentation

## 2017-07-30 DIAGNOSIS — L0292 Furuncle, unspecified: Secondary | ICD-10-CM

## 2017-07-30 DIAGNOSIS — Z79899 Other long term (current) drug therapy: Secondary | ICD-10-CM | POA: Insufficient documentation

## 2017-07-30 DIAGNOSIS — Z882 Allergy status to sulfonamides status: Secondary | ICD-10-CM | POA: Insufficient documentation

## 2017-07-30 DIAGNOSIS — I10 Essential (primary) hypertension: Secondary | ICD-10-CM | POA: Insufficient documentation

## 2017-07-30 DIAGNOSIS — K219 Gastro-esophageal reflux disease without esophagitis: Secondary | ICD-10-CM | POA: Insufficient documentation

## 2017-07-30 DIAGNOSIS — F329 Major depressive disorder, single episode, unspecified: Secondary | ICD-10-CM | POA: Insufficient documentation

## 2017-07-30 MED ORDER — DOXYCYCLINE HYCLATE 100 MG PO TABS
100.0000 mg | ORAL_TABLET | Freq: Two times a day (BID) | ORAL | 0 refills | Status: DC
Start: 2017-07-30 — End: 2018-01-21

## 2017-07-30 MED ORDER — MUPIROCIN 2 % EX OINT: TOPICAL_OINTMENT | CUTANEOUS | 3 refills | Status: DC

## 2017-07-30 MED ORDER — FLUCONAZOLE 150 MG PO TABS: 150.0000 mg | ORAL_TABLET | Freq: Once | ORAL | 0 refills | Status: AC

## 2017-07-30 NOTE — Progress Notes (Signed)
Kelli SquibbShaniece Citro, is a 32 y.o. female  ZOX:[id number]:[id number]  WJX:[id number]  DOB - [birth year]  Subjective:  Chief Complaint and HPI: Kelli SquibbShaniece Eischeid is a 32 y.o. female here today for boil on suprapubic area.  +tender.  She has had these beneath her breast and on her back and tens to get them esp where she shave and gets ingrown hairs.  No f/c.    ROS:   Constitutional:  No f/c, No night sweats, No unexplained weight loss. EENT:  No vision changes, No blurry vision, No hearing changes. No mouth, throat, or ear problems.  Respiratory: No cough, No SOB Cardiac: No CP, no palpitations GI:  No abd pain, No N/V/D. GU: No Urinary s/sx Musculoskeletal: No joint pain Neuro: No headache, no dizziness, no motor weakness.  Skin: No rash Endocrine:  No polydipsia. No polyuria.  Psych: Denies SI/HI  No problems updated.  ALLERGIES: Allergies  Allergen Reactions  . Sulfa Antibiotics Anaphylaxis and Swelling    PAST MEDICAL HISTORY: Past Medical History:  Diagnosis Date  . Anxiety   . Asthma   . Depression   . GERD (gastroesophageal reflux disease)   . Hypertension   . Infection    UTI  . Pregnancy induced hypertension   . Vaginal Pap smear, abnormal    LEEP, normal since    MEDICATIONS AT HOME: Prior to Admission medications   Medication Sig Start Date End Date Taking? Authorizing Provider  albuterol (PROVENTIL HFA;VENTOLIN HFA) 108 (90 Base) MCG/ACT inhaler Inhale 2 puffs into the lungs every 6 (six) hours as needed for wheezing or shortness of breath. 11/07/16  Yes Hairston, Mandesia R, FNP  busPIRone (BUSPAR) 15 MG tablet Take 1 tablet (15 mg total) by mouth 2 (two) times daily. 02/07/17  Yes Hairston, Mandesia R, FNP  FLUoxetine (PROZAC) 40 MG capsule TAKE 1 CAPSULE BY MOUTH DAILY. TAKE WITH 1 CAPSULE OF (20 MG TOTAL) BY MOUTH TO TOTAL 60 MG DAILY. 02/07/17  Yes Hairston, Oren BeckmannMandesia R, FNP  labetalol (NORMODYNE) 300 MG tablet Take 2 tablets (600 mg total) by mouth 2 (two) times  daily. 02/07/17  Yes Hairston, Oren BeckmannMandesia R, FNP  NIFEdipine (PROCARDIA XL/ADALAT-CC) 90 MG 24 hr tablet Take 1 tablet (90 mg total) by mouth daily. 02/07/17  Yes Lizbeth BarkHairston, Mandesia R, FNP  acetaminophen (TYLENOL) 500 MG tablet Take 1,000 mg by mouth every 6 (six) hours as needed for mild pain.    [provider]  docusate sodium (COLACE) 100 MG capsule Take 1 capsule (100 mg total) by mouth daily as needed for mild constipation or moderate constipation. Patient not taking: Reported on 07/30/2017 02/07/17   Lizbeth BarkHairston, Mandesia R, FNP  doxycycline (VIBRA-TABS) 100 MG tablet Take 1 tablet (100 mg total) by mouth 2 (two) times daily. 07/30/17   Anders SimmondsMcClung, Angela M, PA-C  fluconazole (DIFLUCAN) 150 MG tablet Take 1 tablet (150 mg total) by mouth once for 1 dose. 07/30/17 07/30/17  Anders SimmondsMcClung, Angela M, PA-C  FLUoxetine (PROZAC) 20 MG capsule TAKE 1 TABLET BY MOUTH DAILY. TAKE WITH 1 CAPSULE OF (40 MG TOTAL) BY MOUTH TO TOTAL 60 MG DAILY. Patient not taking: Reported on 07/30/2017 02/07/17   Lizbeth BarkHairston, Mandesia R, FNP  mupirocin ointment (BACTROBAN) 2 % Apply bid X 7days prn 07/30/17   Anders SimmondsMcClung, Angela M, PA-C  Prenatal Vit-Fe Fumarate-FA (PRENATAL MULTIVITAMIN) TABS tablet Take 1 tablet by mouth daily at 12 noon.    [provider]  ranitidine (ZANTAC) 150 MG tablet Take 150 mg by mouth 4 (four) times  daily.    [provider]     Objective:  EXAM:   Vitals:   07/30/17 0932  BP: 139/88  Pulse: 69  Resp: 16  Temp: 98.3 F (36.8 C)  TempSrc: Oral  SpO2: 99%  Weight: 171 lb 3.2 oz (77.7 kg)    General appearance : A&OX3. NAD. Non-toxic-appearing HEENT: Atraumatic and Normocephalic.  PERRLA. EOM intact.  Neck: supple, no JVD. No cervical lymphadenopathy. No thyromegaly Chest/Lungs:  Breathing-non-labored, Good air entry bilaterally, breath sounds normal without rales, rhonchi, or wheezing  CVS: S1 S2 regular, no murmurs, gallops, rubs  Suprapubic mound with 1 cm fluctuant boil on  the R.  No induration, +mild erythema.  No surrounding. LN.   Extremities: Bilateral Lower Ext shows no edema, both legs are warm to touch with = pulse throughout Neurology:  CN II-XII grossly intact, Non focal.   Psych:  TP linear. J/I WNL. Normal speech. Appropriate eye contact and affect.  Skin:  No Rash  Data Review No results found for: HGBA1C   Assessment & Plan   1. 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  Skin care, adequate water intake discussed and encouraged.   - doxycycline (VIBRA-TABS) 100 MG tablet; Take 1 tablet (100 mg total) by mouth 2 (two) times daily.  Dispense: 20 tablet; Refill: 0 - mupirocin ointment (BACTROBAN) 2 %; Apply bid X 7days prn  Dispense: 22 g; Refill: 3 - fluconazole (DIFLUCAN) 150 MG tablet; Take 1 tablet (150 mg total) by mouth once for 1 dose.  Dispense: 1 tablet; Refill: 0     Patient have been counseled extensively about nutrition and exercise  Return if symptoms worsen or fail to improve.  The patient was given clear instructions to go to ER or return to medical center if symptoms don't improve, worsen or new problems develop. The patient verbalized understanding. The patient was told to call to get lab results if they haven't heard anything in the next week.     Georgian Co, PA-C [facility] [place], [place] [phone]   07/30/2017, 9:57 AMPatient ID: Kelli Wells, female   DOB: [birth year], 32 y.o.   MRN: [id number]

## 2017-07-30 NOTE — Patient Instructions (Signed)
Skin Abscess A skin abscess is an infected area on or under your skin that contains pus and other material. An abscess can happen almost anywhere on your body. Some abscesses break open (rupture) on their own. Most continue to get worse unless they are treated. The infection can spread deeper into the body and into your blood, which can make you feel sick. Treatment usually involves draining the abscess. Follow these instructions at home: Abscess Care  If you have an abscess that has not drained, place a warm, clean, wet washcloth over the abscess several times a day. Do this as told by your doctor.  Follow instructions from your doctor about how to take care of your abscess. Make sure you: ? Cover the abscess with a bandage (dressing). ? Change your bandage or gauze as told by your doctor. ? Wash your hands with soap and water before you change the bandage or gauze. If you cannot use soap and water, use hand sanitizer.  Check your abscess every day for signs that the infection is getting worse. Check for: ? More redness, swelling, or pain. ? More fluid or blood. ? Warmth. ? More pus or a bad smell. Medicines   Take over-the-counter and prescription medicines only as told by your doctor.  If you were prescribed an antibiotic medicine, take it as told by your doctor. Do not stop taking the antibiotic even if you start to feel better. General instructions  To avoid spreading the infection: ? Do not share personal care items, towels, or hot tubs with others. ? Avoid making skin-to-skin contact with other people.  Keep all follow-up visits as told by your doctor. This is important. Contact a doctor if:  You have more redness, swelling, or pain around your abscess.  You have more fluid or blood coming from your abscess.  Your abscess feels warm when you touch it.  You have more pus or a bad smell coming from your abscess.  You have a fever.  Your muscles ache.  You have  chills.  You feel sick. Get help right away if:  You have very bad (severe) pain.  You see red streaks on your skin spreading away from the abscess. This information is not intended to replace advice given to you by your health care provider. Make sure you discuss any questions you have with your health care provider. Document Released: 10/16/2007 Document Revised: 12/24/2015 Document Reviewed: 03/08/2015 Elsevier Interactive Patient Education  2018 [employer]  

## 2017-07-30 NOTE — Progress Notes (Signed)
Pt states the boil popped but it keeps coming back  Pt states she has a hx of cyst

## 2017-09-01 ENCOUNTER — Emergency Department (HOSPITAL_COMMUNITY)
Admission: EM | Admit: 2017-09-01 | Discharge: 2017-09-02 | Disposition: A | Discharge status: Home / Self Care | Payer: PRIVATE HEALTH INSURANCE | Attending: Emergency Medicine | Admitting: Emergency Medicine

## 2017-09-01 ENCOUNTER — Other Ambulatory Visit: Payer: Self-pay

## 2017-09-01 ENCOUNTER — Encounter (HOSPITAL_COMMUNITY): Payer: Self-pay | Admitting: Emergency Medicine

## 2017-09-01 ENCOUNTER — Emergency Department (HOSPITAL_COMMUNITY): Payer: PRIVATE HEALTH INSURANCE

## 2017-09-01 DIAGNOSIS — R2243 Localized swelling, mass and lump, lower limb, bilateral: Secondary | ICD-10-CM | POA: Diagnosis not present

## 2017-09-01 DIAGNOSIS — Z79899 Other long term (current) drug therapy: Secondary | ICD-10-CM | POA: Diagnosis not present

## 2017-09-01 DIAGNOSIS — I10 Essential (primary) hypertension: Secondary | ICD-10-CM | POA: Insufficient documentation

## 2017-09-01 DIAGNOSIS — J45909 Unspecified asthma, uncomplicated: Secondary | ICD-10-CM | POA: Diagnosis not present

## 2017-09-01 DIAGNOSIS — R202 Paresthesia of skin: Secondary | ICD-10-CM | POA: Insufficient documentation

## 2017-09-01 DIAGNOSIS — F17210 Nicotine dependence, cigarettes, uncomplicated: Secondary | ICD-10-CM | POA: Diagnosis not present

## 2017-09-01 LAB — CBC
HEMATOCRIT: 39.3 % (ref 36.0–46.0)
Hemoglobin: 13.2 g/dL (ref 12.0–15.0)
MCH: 29.1 pg (ref 26.0–34.0)
MCHC: 33.6 g/dL (ref 30.0–36.0)
MCV: 86.6 fL (ref 78.0–100.0)
Platelets: 295 10*3/uL (ref 150–400)
RBC: 4.54 MIL/uL (ref 3.87–5.11)
RDW: 13.3 % (ref 11.5–15.5)
WBC: 6.9 10*3/uL (ref 4.0–10.5)

## 2017-09-01 LAB — BASIC METABOLIC PANEL
Anion gap: 9 (ref 5–15)
BUN: 7 mg/dL (ref 6–20)
CO2: 22 mmol/L (ref 22–32)
Calcium: 9.3 mg/dL (ref 8.9–10.3)
Chloride: 108 mmol/L (ref 101–111)
Creatinine, Ser: 0.77 mg/dL (ref 0.44–1.00)
GFR calc Af Amer: >60 mL/min (ref >60)
GLUCOSE: 76 mg/dL (ref 65–99)
POTASSIUM: 3.6 mmol/L (ref 3.5–5.1)
Sodium: 139 mmol/L (ref 135–145)

## 2017-09-01 LAB — I-STAT TROPONIN, ED: Troponin i, poc: 0 ng/mL (ref 0.00–0.08)

## 2017-09-01 LAB — I-STAT BETA HCG BLOOD, ED (MC, WL, AP ONLY): I-stat hCG, quantitative: <5 m[IU]/mL (ref <5)

## 2017-09-01 MED ORDER — NIFEDIPINE ER OSMOTIC RELEASE 90 MG PO TB24
90.0000 mg | ORAL_TABLET | Freq: Every day | ORAL | Status: DC
  Administered 2017-09-01: 90 mg via ORAL
  Filled 2017-09-01: qty 1

## 2017-09-01 MED ORDER — LABETALOL HCL 200 MG PO TABS
600.0000 mg | ORAL_TABLET | Freq: Two times a day (BID) | ORAL | Status: DC
  Administered 2017-09-01: 600 mg via ORAL
  Filled 2017-09-01: qty 3

## 2017-09-01 NOTE — ED Provider Notes (Signed)
MSE was initiated and I personally evaluated the patient and placed orders (if any) at  5:16 PM on September 01, 2017.  The patient appears stable so that the remainder of the MSE may be completed by another provider.  Patient placed in Quick Look pathway, seen and evaluated   Chief Complaint: Patient sent to the emergency department for evaluation of hypertension  HPI:   Patient was at the health department for routine gynecologic exam and had elevated blood pressure.  The health department sent her here.  She has been out of her regular medications for the past 2 days and did not take them.  She states that earlier she had some reflux symptoms and noticed some numbness in her left upper and lower extremity that have resolved.  ROS: Numbness (one)  Physical Exam:   Gen: No distress  Neuro: Awake and Alert  Skin: Warm    Focused Exam: Gross neurologic exam intact.  Lungs clear to auscultation bilaterally   Initiation of care has begun. The patient has been counseled on the process, plan, and necessity for staying for the completion/evaluation, and the remainder of the medical screening examination    Arthor CaptainHarris, Kassiah Mccrory, PA-C 09/01/17 1718    Melene PlanFloyd, Dan, DO 09/01/17 2323

## 2017-09-01 NOTE — ED Triage Notes (Signed)
Pt sent by health department, BP 200/119. Pt states she ran out of her meds yesterday. C/o "reflux" like pain. Arthor CaptainAbigail Harris at the bedside.

## 2017-09-02 MED ORDER — LABETALOL HCL 300 MG PO TABS: 600.0000 mg | ORAL_TABLET | Freq: Two times a day (BID) | ORAL | 1 refills | Status: DC

## 2017-09-02 MED ORDER — NIFEDIPINE ER OSMOTIC RELEASE 90 MG PO TB24: 90.0000 mg | ORAL_TABLET | Freq: Every day | ORAL | 1 refills | Status: DC

## 2017-09-02 MED ORDER — BUSPIRONE HCL 15 MG PO TABS: 15.0000 mg | ORAL_TABLET | Freq: Three times a day (TID) | ORAL | 1 refills | Status: DC

## 2017-09-02 NOTE — Discharge Instructions (Addendum)
Lab tests today looked good.  Blood pressure is coming down after your medications. Follow-up with your primary care doctor to keep close check on your blood pressure. Return here for any new/acute changes.

## 2017-09-02 NOTE — ED Provider Notes (Signed)
[facility] EMERGENCY DEPARTMENT Provider Note   CSN: [id number] Arrival date & time: 09/01/17  1638     History   Chief Complaint Chief Complaint  Patient presents with  . Hypertension    HPI Nihal Doan is a 32 y.o. female.  The history is provided by the patient and medical records.  Hypertension     32 year old female with history of anxiety, asthma, depression, GERD, hypertension, presenting to the ED with elevated blood pressure.  Patient went to the health department for routine GYN exam when she was told that her blood pressure was elevated.  She has run out of her home blood pressure medications 2 days ago.  States usually when taking her medications normally her blood pressure is well controlled.  States she has not had any chest pain, shortness of breath, or headaches.  She has had some mild ankle edema but has been on her feet a lot lately.  She denies any new added stress.  She has no known cardiac history.  States she did notice some brief "tingling" in her left arm that resolved after she moved it around.  Has no focal numbness/weakness in any of her extremities at this time.  Past Medical History:  Diagnosis Date  . Anxiety   . Asthma   . Depression   . GERD (gastroesophageal reflux disease)   . Hypertension   . Infection    UTI  . Pregnancy induced hypertension   . Vaginal Pap smear, abnormal    LEEP, normal since    Patient Active Problem List   Diagnosis Date Noted  . Hypertension 12/14/2015  . Anxiety and depression 12/14/2015  . Cholecystitis with cholelithiasis 12/17/2014  . Pregnancy 05/22/2014  . Postpartum state 05/12/2014  . Chronic hypertension in pregnancy   . [redacted] weeks gestation of pregnancy   . Non-reactive NST (non-stress test)   . [redacted] weeks gestation of pregnancy     Past Surgical History:  Procedure Laterality Date  . CHOLECYSTECTOMY N/A 12/18/2014   Procedure: LAPAROSCOPIC CHOLECYSTECTOMY WITH INTRAOPERATIVE  CHOLANGIOGRAM;  Surgeon: Chevis Pretty III, MD;  Location: [facility] OR;  Service: General;  Laterality: N/A;  . LEEP    . LYMPH GLAND EXCISION       OB History    Gravida  2   Para  2   Term  2   Preterm  0   AB  0   Living  2     SAB  0   TAB  0   Ectopic  0   Multiple  0   Live Births  2            Home Medications    Prior to Admission medications   Medication Sig Start Date End Date Taking? Authorizing Provider  acetaminophen (TYLENOL) 500 MG tablet Take 1,000 mg by mouth every 6 (six) hours as needed for mild pain.    [provider]  albuterol (PROVENTIL HFA;VENTOLIN HFA) 108 (90 Base) MCG/ACT inhaler Inhale 2 puffs into the lungs every 6 (six) hours as needed for wheezing or shortness of breath. 11/07/16   Hairston, Oren Beckmann, FNP  busPIRone (BUSPAR) 15 MG tablet Take 1 tablet (15 mg total) by mouth 2 (two) times daily. 02/07/17   [name], FNP  docusate sodium (COLACE) 100 MG capsule Take 1 capsule (100 mg total) by mouth daily as needed for mild constipation or moderate constipation. Patient not taking: Reported on 07/30/2017 02/07/17   Arrie Senate  R, FNP  doxycycline (VIBRA-TABS) 100 MG tablet Take 1 tablet (100 mg total) by mouth 2 (two) times daily. 07/30/17   Anders SimmondsMcClung, Angela M, PA-C  FLUoxetine (PROZAC) 20 MG capsule TAKE 1 TABLET BY MOUTH DAILY. TAKE WITH 1 CAPSULE OF (40 MG TOTAL) BY MOUTH TO TOTAL 60 MG DAILY. Patient not taking: Reported on 07/30/2017 02/07/17   Lizbeth BarkHairston, Mandesia R, FNP  FLUoxetine (PROZAC) 40 MG capsule TAKE 1 CAPSULE BY MOUTH DAILY. TAKE WITH 1 CAPSULE OF (20 MG TOTAL) BY MOUTH TO TOTAL 60 MG DAILY. 02/07/17   Lizbeth BarkHairston, Mandesia R, FNP  labetalol (NORMODYNE) 300 MG tablet Take 2 tablets (600 mg total) by mouth 2 (two) times daily. 02/07/17   Lizbeth BarkHairston, Mandesia R, FNP  mupirocin ointment (BACTROBAN) 2 % Apply bid X 7days prn 07/30/17   Anders SimmondsMcClung, Angela M, PA-C  NIFEdipine (PROCARDIA XL/ADALAT-CC) 90 MG 24 hr tablet Take 1  tablet (90 mg total) by mouth daily. 02/07/17   Lizbeth BarkHairston, Mandesia R, FNP  Prenatal Vit-Fe Fumarate-FA (PRENATAL MULTIVITAMIN) TABS tablet Take 1 tablet by mouth daily at 12 noon.    [provider]  ranitidine (ZANTAC) 150 MG tablet Take 150 mg by mouth 4 (four) times daily.    [provider]    Family History Family History  Problem Relation Age of Onset  . Hypertension Maternal Grandmother   . Cancer Maternal Grandmother        breast  . Hypertension Maternal Grandfather   . Kidney disease Paternal Grandmother   . Heart disease Paternal Grandfather   . Hypertension Mother   . Cancer Mother        rectal  . Hypertension Father   . Hearing loss Neg Hx     Social History Social History   Tobacco Use  . Smoking status: Current Every Day Smoker    Packs/day: 0.25    Types: Cigarettes  . Smokeless tobacco: Never Used  . Tobacco comment: quit June 2015  Substance Use Topics  . Alcohol use: Yes    Alcohol/week: 1.2 - 1.8 oz    Types: 2 - 3 Glasses of wine per week    Comment: occas  . Drug use: No     Allergies   Sulfa antibiotics   Review of Systems Review of Systems  Constitutional:       HTN  All other systems reviewed and are negative.    Physical Exam Updated Vital Signs BP (!) 173/109 (BP Location: Right Arm)   Pulse 64   Temp 98.5 F (36.9 C) (Oral)   Resp 18   Ht 5\' 4"  (1.626 m)   Wt 79.4 kg (175 lb)   SpO2 99%   BMI 30.04 kg/m   Physical Exam  Constitutional: She is oriented to person, place, and time. She appears well-developed and well-nourished.  HENT:  Head: Normocephalic and atraumatic.  Mouth/Throat: Oropharynx is clear and moist.  Eyes: Pupils are equal, round, and reactive to light. Conjunctivae and EOM are normal.  Neck: Normal range of motion.  Cardiovascular: Normal rate, regular rhythm and normal heart sounds.  Pulmonary/Chest: Effort normal and breath sounds normal. No stridor. No respiratory distress.    Abdominal: Soft. Bowel sounds are normal. There is no tenderness. There is no rebound.  Musculoskeletal: Normal range of motion.  Trace ankle edema; no calf asymmetry, tenderness, or palpable cords  Neurological: She is alert and oriented to person, place, and time.  AAOx3, answering questions and following commands appropriately; equal strength UE and LE  bilaterally; CN grossly intact; moves all extremities appropriately without ataxia; no focal neuro deficits or facial asymmetry appreciated  Skin: Skin is warm and dry.  Psychiatric: She has a normal mood and affect.  Nursing note and vitals reviewed.    ED Treatments / Results  Labs (all labs ordered are listed, but only abnormal results are displayed) Labs Reviewed  BASIC METABOLIC PANEL  CBC  I-STAT TROPONIN, ED  I-STAT BETA HCG BLOOD, ED ([facility], [facility], [facility] ONLY)    EKG None  Radiology Dg Chest 2 View  Result Date: 09/01/2017 CLINICAL DATA:  Hypertension EXAM: CHEST - 2 VIEW COMPARISON:  None. FINDINGS: The heart size and mediastinal contours are within normal limits. Both lungs are clear. The visualized skeletal structures are unremarkable. IMPRESSION: No active cardiopulmonary disease. Electronically Signed   By: Jasmine Pang M.D.   On: 09/01/2017 18:52    Procedures Procedures (including critical care time)  Medications Ordered in ED Medications  NIFEdipine (PROCARDIA XL/ADALAT-CC) 24 hr tablet 90 mg (90 mg Oral Given 09/01/17 2229)  labetalol (NORMODYNE) tablet 600 mg (600 mg Oral Not Given 09/02/17 0302)     Initial Impression / Assessment and Plan / ED Course  I have reviewed the triage vital signs and the nursing notes.  Pertinent labs & imaging results that were available during my care of the patient were reviewed by me and considered in my medical decision making (see chart for details).  32 year old female presenting to the ED with hypertension.  She went to the health department for routine Pap smear and was  sent in due to elevated blood pressure.  She has been out of her home medications for about 2 days.  She denies any chest pain, shortness of breath, headaches, or dizziness.  She did have brief "tingling" in her left arm that resolved after shaking her arm.  This has not recurred.  She does not have any focal numbness or weakness on my exam.  Screening labs overall reassuring.  Chest x-ray is clear.  Blood pressure has improved after administration of her home medications.  There are no clinical signs of endorgan damage at this time.  I have refilled patient's blood pressure medications as well as her Buspar.  We will have her follow-up closely with her primary care doctor for further refills.  She understands to return here for any new or worsening symptoms.  Final Clinical Impressions(s) / ED Diagnoses   Final diagnoses:  Essential hypertension    ED Discharge Orders        Ordered    NIFEdipine (PROCARDIA XL) 90 MG 24 hr tablet  Daily     09/02/17 0302    labetalol (NORMODYNE) 300 MG tablet  2 times daily     09/02/17 0302    busPIRone (BUSPAR) 15 MG tablet  3 times daily     09/02/17 0302       Garlon Hatchet, PA-C 09/02/17 0981    Azalia Bilis, MD 09/02/17 [phone]

## 2017-09-12 ENCOUNTER — Ambulatory Visit: Payer: PRIVATE HEALTH INSURANCE | Attending: Family Medicine | Admitting: Family Medicine

## 2017-09-12 ENCOUNTER — Encounter: Payer: Self-pay | Admitting: Family Medicine

## 2017-09-12 VITALS — BP 104/63 | HR 78 | Temp 97.8°F | Ht 64.0 in | Wt 170.2 lb

## 2017-09-12 DIAGNOSIS — F419 Anxiety disorder, unspecified: Secondary | ICD-10-CM | POA: Diagnosis not present

## 2017-09-12 DIAGNOSIS — Z9889 Other specified postprocedural states: Secondary | ICD-10-CM | POA: Insufficient documentation

## 2017-09-12 DIAGNOSIS — J45909 Unspecified asthma, uncomplicated: Secondary | ICD-10-CM | POA: Insufficient documentation

## 2017-09-12 DIAGNOSIS — K219 Gastro-esophageal reflux disease without esophagitis: Secondary | ICD-10-CM | POA: Diagnosis not present

## 2017-09-12 DIAGNOSIS — Z882 Allergy status to sulfonamides status: Secondary | ICD-10-CM | POA: Diagnosis not present

## 2017-09-12 DIAGNOSIS — Z79899 Other long term (current) drug therapy: Secondary | ICD-10-CM | POA: Diagnosis not present

## 2017-09-12 DIAGNOSIS — Z9049 Acquired absence of other specified parts of digestive tract: Secondary | ICD-10-CM | POA: Insufficient documentation

## 2017-09-12 DIAGNOSIS — Z8709 Personal history of other diseases of the respiratory system: Secondary | ICD-10-CM

## 2017-09-12 DIAGNOSIS — I10 Essential (primary) hypertension: Secondary | ICD-10-CM | POA: Diagnosis present

## 2017-09-12 DIAGNOSIS — F329 Major depressive disorder, single episode, unspecified: Secondary | ICD-10-CM | POA: Insufficient documentation

## 2017-09-12 MED ORDER — CARVEDILOL 12.5 MG PO TABS: 12.5000 mg | ORAL_TABLET | Freq: Two times a day (BID) | ORAL | 6 refills | Status: DC

## 2017-09-12 MED ORDER — BUSPIRONE HCL 15 MG PO TABS: 15.0000 mg | ORAL_TABLET | Freq: Three times a day (TID) | ORAL | 3 refills | Status: DC

## 2017-09-12 MED ORDER — FLUOXETINE HCL 20 MG PO CAPS: ORAL_CAPSULE | ORAL | 3 refills | Status: DC

## 2017-09-12 MED ORDER — FLUOXETINE HCL 40 MG PO CAPS: ORAL_CAPSULE | ORAL | 3 refills | Status: DC

## 2017-09-12 MED ORDER — BUSPIRONE HCL 15 MG PO TABS: 15.0000 mg | ORAL_TABLET | Freq: Three times a day (TID) | ORAL | 1 refills | Status: DC

## 2017-09-12 MED ORDER — AMLODIPINE BESYLATE 10 MG PO TABS: 10.0000 mg | ORAL_TABLET | Freq: Every day | ORAL | 6 refills | Status: DC

## 2017-09-12 MED ORDER — ALBUTEROL SULFATE HFA 108 (90 BASE) MCG/ACT IN AERS: 2.0000 | INHALATION_SPRAY | Freq: Four times a day (QID) | RESPIRATORY_TRACT | 2 refills | Status: DC | PRN

## 2017-09-12 NOTE — Progress Notes (Signed)
Subjective:  Patient ID: Kelli Wells, female    DOB: 1985-10-02  Age: 32 y.o. MRN: [id number]  CC: Hypertension and Establish Care   HPI [name]  is a 32 year old female with a history of anxiety and depression, hypertension who presents today for a follow-up visit.  She reports doing well on BuSpar and Prozac and her anxiety and depression symptoms are controlled.  She tolerates her labetalol and nifedipine however complains of the cost of medications and has a list of the [facility] for the low medications which she would like to switch to if possible. On 09/01/2017 she had an ED visit for hypertension after she had run out of her medications and her blood pressure was 173/109.  She received a refill which lasted her until her appointment today. Today she denies chest pains, shortness of breath and has no additional concerns.  Past Medical History:  Diagnosis Date  . Anxiety   . Asthma   . Depression   . GERD (gastroesophageal reflux disease)   . Hypertension   . Infection    UTI  . Pregnancy induced hypertension   . Vaginal Pap smear, abnormal    LEEP, normal since    Past Surgical History:  Procedure Laterality Date  . CHOLECYSTECTOMY N/A 12/18/2014   Procedure: LAPAROSCOPIC CHOLECYSTECTOMY WITH INTRAOPERATIVE CHOLANGIOGRAM;  Surgeon: Chevis Pretty III, MD;  Location: [facility] OR;  Service: General;  Laterality: N/A;  . LEEP    . LYMPH GLAND EXCISION      Allergies  Allergen Reactions  . Sulfa Antibiotics Anaphylaxis and Swelling     Outpatient Medications Prior to Visit  Medication Sig Dispense Refill  . albuterol (PROVENTIL HFA;VENTOLIN HFA) 108 (90 Base) MCG/ACT inhaler Inhale 2 puffs into the lungs every 6 (six) hours as needed for wheezing or shortness of breath. 1 Inhaler 2  . busPIRone (BUSPAR) 15 MG tablet Take 1 tablet (15 mg total) by mouth 3 (three) times daily. 30 tablet 1  . FLUoxetine (PROZAC) 20 MG capsule TAKE 1 TABLET BY MOUTH DAILY. TAKE WITH 1 CAPSULE  OF (40 MG TOTAL) BY MOUTH TO TOTAL 60 MG DAILY. 30 capsule 3  . FLUoxetine (PROZAC) 40 MG capsule TAKE 1 CAPSULE BY MOUTH DAILY. TAKE WITH 1 CAPSULE OF (20 MG TOTAL) BY MOUTH TO TOTAL 60 MG DAILY. (Patient taking differently: Take 40 mg by mouth daily. ) 30 capsule 3  . labetalol (NORMODYNE) 300 MG tablet Take 2 tablets (600 mg total) by mouth 2 (two) times daily. 120 tablet 1  . NIFEdipine (PROCARDIA XL) 90 MG 24 hr tablet Take 1 tablet (90 mg total) by mouth daily. 30 tablet 1  . docusate sodium (COLACE) 100 MG capsule Take 1 capsule (100 mg total) by mouth daily as needed for mild constipation or moderate constipation. (Patient not taking: Reported on 07/30/2017) 30 capsule 0  . doxycycline (VIBRA-TABS) 100 MG tablet Take 1 tablet (100 mg total) by mouth 2 (two) times daily. (Patient not taking: Reported on 09/02/2017) 20 tablet 0  . mupirocin ointment (BACTROBAN) 2 % Apply bid X 7days prn (Patient not taking: Reported on 09/02/2017) 22 g 3   No facility-administered medications prior to visit.     ROS Review of Systems  Constitutional: Negative for activity change, appetite change and fatigue.  HENT: Negative for congestion, sinus pressure and sore throat.   Eyes: Negative for visual disturbance.  Respiratory: Negative for cough, chest tightness, shortness of breath and wheezing.   Cardiovascular: Negative for chest pain and  palpitations.  Gastrointestinal: Negative for abdominal distention, abdominal pain and constipation.  Endocrine: Negative for polydipsia.  Genitourinary: Negative for dysuria and frequency.  Musculoskeletal: Negative for arthralgias and back pain.  Skin: Negative for rash.  Neurological: Negative for tremors, light-headedness and numbness.  Hematological: Does not bruise/bleed easily.  Psychiatric/Behavioral: Negative for agitation and behavioral problems.    Objective:  BP 104/63   Pulse 78   Temp 97.8 F (36.6 C) (Oral)   Ht  (1.626 m)   Wt 170 lb 3.2 oz  (77.2 kg)   SpO2 98%   BMI 29.21 kg/m   BP/Weight 09/12/2017 09/02/2017 09/01/2017  Systolic BP 104 126 -  Diastolic BP 63 89 -  Wt. (Lbs) 170.2 - 175  BMI 29.21 - 30.04     Physical Exam  Constitutional: She is oriented to person, place, and time. She appears well-developed and well-nourished.  Cardiovascular: Normal rate, normal heart sounds and intact distal pulses.  No murmur heard. Pulmonary/Chest: Effort normal and breath sounds normal. She has no wheezes. She has no rales. She exhibits no tenderness.  Abdominal: Soft. Bowel sounds are normal. She exhibits no distension and no mass. There is no tenderness.  Musculoskeletal: Normal range of motion.  Neurological: She is alert and oriented to person, place, and time.  Skin: Skin is warm and dry.  Psychiatric: She has a normal mood and affect.     Assessment & Plan:   1. Essential hypertension Controlled We will switch from nifedipine and labetalol to amlodipine and Coreg given  the latter 2 medications are on the [facility] $4 list Advised to keep a blood pressure log and inform the clinic in the event of hypotension or elevated blood pressures Counseled on blood pressure goal of less than 130/80, low-sodium, DASH diet, medication compliance, 150 minutes of moderate intensity exercise per week. Discussed medication compliance, adverse effects. - Lipid panel; Future  2. Anxiety and depression Controlled Discussed the option of in-house counseling with LCSW -states she does not need this at this time - FLUoxetine (PROZAC) 40 MG capsule; TAKE 1 CAPSULE BY MOUTH DAILY. TAKE WITH 1 CAPSULE OF (20 MG TOTAL) BY MOUTH TO TOTAL 60 MG DAILY.  Dispense: 30 capsule; Refill: 3 - FLUoxetine (PROZAC) 20 MG capsule; TAKE 1 TABLET BY MOUTH DAILY. TAKE WITH 1 CAPSULE OF (40 MG TOTAL) BY MOUTH TO TOTAL 60 MG DAILY.  Dispense: 30 capsule; Refill: 3  3. History of asthma Stable - albuterol (PROVENTIL HFA;VENTOLIN HFA) 108 (90 Base) MCG/ACT  inhaler; Inhale 2 puffs into the lungs every 6 (six) hours as needed for wheezing or shortness of breath.  Dispense: 1 Inhaler; Refill: 2   Meds ordered this encounter  Medications  . carvedilol (COREG) 12.5 MG tablet    Sig: Take 1 tablet (12.5 mg total) by mouth 2 (two) times daily with a meal.    Dispense:  60 tablet    Refill:  6    Discontinue labetalol  . amLODipine (NORVASC) 10 MG tablet    Sig: Take 1 tablet (10 mg total) by mouth daily.    Dispense:  30 tablet    Refill:  6    Discontinue nifedipine  . FLUoxetine (PROZAC) 40 MG capsule    Sig: TAKE 1 CAPSULE BY MOUTH DAILY. TAKE WITH 1 CAPSULE OF (20 MG TOTAL) BY MOUTH TO TOTAL 60 MG DAILY.    Dispense:  30 capsule    Refill:  3  . FLUoxetine (PROZAC) 20 MG capsule  Sig: TAKE 1 TABLET BY MOUTH DAILY. TAKE WITH 1 CAPSULE OF (40 MG TOTAL) BY MOUTH TO TOTAL 60 MG DAILY.    Dispense:  30 capsule    Refill:  3  . busPIRone (BUSPAR) 15 MG tablet    Sig: Take 1 tablet (15 mg total) by mouth 3 (three) times daily.    Dispense:  30 tablet    Refill:  1  . albuterol (PROVENTIL HFA;VENTOLIN HFA) 108 (90 Base) MCG/ACT inhaler    Sig: Inhale 2 puffs into the lungs every 6 (six) hours as needed for wheezing or shortness of breath.    Dispense:  1 Inhaler    Refill:  2    Follow-up: Return in about 6 weeks (around 10/24/2017) for follow up on hypertension.   Hoy Register MD

## 2017-09-12 NOTE — Patient Instructions (Signed)

## 2017-09-16 ENCOUNTER — Other Ambulatory Visit: Payer: Self-pay

## 2017-09-26 ENCOUNTER — Ambulatory Visit: Payer: PRIVATE HEALTH INSURANCE | Attending: Family Medicine

## 2017-09-26 DIAGNOSIS — I10 Essential (primary) hypertension: Secondary | ICD-10-CM

## 2017-09-27 LAB — LIPID PANEL
Chol/HDL Ratio: 3.1 ratio (ref 0.0–4.4)
Cholesterol, Total: 191 mg/dL (ref 100–199)
HDL: 61 mg/dL
LDL Calculated: 121 mg/dL — ABNORMAL HIGH (ref 0–99)
Triglycerides: 47 mg/dL (ref 0–149)
VLDL Cholesterol Cal: 9 mg/dL (ref 5–40)

## 2017-09-30 ENCOUNTER — Telehealth: Payer: Self-pay

## 2017-09-30 NOTE — Telephone Encounter (Signed)
Patient was called to go over lab results. Patients voicemail is full and can not leave any messages.    If patient returns phone call please inform patient of lab below.

## 2017-09-30 NOTE — Telephone Encounter (Signed)
-----   Message from Enobong Newlin, MD sent at 09/29/2017  9:22 AM EDT ----- Please inform the patient that labs are normal. Thank you. 

## 2017-10-27 ENCOUNTER — Encounter: Payer: Self-pay | Admitting: Family Medicine

## 2017-10-27 ENCOUNTER — Ambulatory Visit: Payer: PRIVATE HEALTH INSURANCE | Attending: Family Medicine | Admitting: Family Medicine

## 2017-10-27 VITALS — BP 120/77 | HR 68 | Temp 97.7°F | Ht 64.0 in | Wt 172.8 lb

## 2017-10-27 DIAGNOSIS — F419 Anxiety disorder, unspecified: Secondary | ICD-10-CM | POA: Insufficient documentation

## 2017-10-27 DIAGNOSIS — I10 Essential (primary) hypertension: Secondary | ICD-10-CM | POA: Diagnosis present

## 2017-10-27 DIAGNOSIS — Z882 Allergy status to sulfonamides status: Secondary | ICD-10-CM | POA: Diagnosis not present

## 2017-10-27 DIAGNOSIS — K219 Gastro-esophageal reflux disease without esophagitis: Secondary | ICD-10-CM | POA: Insufficient documentation

## 2017-10-27 DIAGNOSIS — F329 Major depressive disorder, single episode, unspecified: Secondary | ICD-10-CM | POA: Diagnosis not present

## 2017-10-27 DIAGNOSIS — Z79899 Other long term (current) drug therapy: Secondary | ICD-10-CM | POA: Diagnosis not present

## 2017-10-27 DIAGNOSIS — J45909 Unspecified asthma, uncomplicated: Secondary | ICD-10-CM | POA: Diagnosis not present

## 2017-10-27 MED ORDER — CARVEDILOL 25 MG PO TABS: 25.0000 mg | ORAL_TABLET | Freq: Two times a day (BID) | ORAL | 3 refills | Status: DC

## 2017-10-27 NOTE — Progress Notes (Signed)
Subjective:  Patient ID: Kelli Wells, female    DOB: 12/05/1985  Age: 32 y.o. MRN: [id number]  CC: Hypertension   HPI Kelli SquibbShaniece Forge   is a 32 year old female with a history of anxiety and depression, hypertension who presents today for a follow-up visit.  At her last visit nifedipine and labetalol  were changed to amlodipine and carvedilol due to cost and she reports tolerating this new medications and being able to afford them.  Her blood pressure is controlled today but she informs me she had a systolic in the 140s previously and took two 12.5 mg of carvedilol this morning because she was coming to her appointment today and wanted her blood pressure controlled. She has no additional concerns today.  Past Medical History:  Diagnosis Date  . Anxiety   . Asthma   . Depression   . GERD (gastroesophageal reflux disease)   . Hypertension   . Infection    UTI  . Pregnancy induced hypertension   . Vaginal Pap smear, abnormal    LEEP, normal since    Past Surgical History:  Procedure Laterality Date  . CHOLECYSTECTOMY N/A 12/18/2014   Procedure: LAPAROSCOPIC CHOLECYSTECTOMY WITH INTRAOPERATIVE CHOLANGIOGRAM;  Surgeon: Chevis PrettyPaul Toth III, MD;  Location: [facility] OR;  Service: General;  Laterality: N/A;  . LEEP    . LYMPH GLAND EXCISION      Allergies  Allergen Reactions  . Sulfa Antibiotics Anaphylaxis and Swelling     Outpatient Medications Prior to Visit  Medication Sig Dispense Refill  . albuterol (PROVENTIL HFA;VENTOLIN HFA) 108 (90 Base) MCG/ACT inhaler Inhale 2 puffs into the lungs every 6 (six) hours as needed for wheezing or shortness of breath. 1 Inhaler 2  . amLODipine (NORVASC) 10 MG tablet Take 1 tablet (10 mg total) by mouth daily. 30 tablet 6  . busPIRone (BUSPAR) 15 MG tablet Take 1 tablet (15 mg total) by mouth 3 (three) times daily. 30 tablet 3  . FLUoxetine (PROZAC) 20 MG capsule TAKE 1 TABLET BY MOUTH DAILY. TAKE WITH 1 CAPSULE OF (40 MG TOTAL) BY MOUTH TO TOTAL 60  MG DAILY. 30 capsule 3  . FLUoxetine (PROZAC) 40 MG capsule TAKE 1 CAPSULE BY MOUTH DAILY. TAKE WITH 1 CAPSULE OF (20 MG TOTAL) BY MOUTH TO TOTAL 60 MG DAILY. 30 capsule 3  . carvedilol (COREG) 12.5 MG tablet Take 1 tablet (12.5 mg total) by mouth 2 (two) times daily with a meal. 60 tablet 6  . docusate sodium (COLACE) 100 MG capsule Take 1 capsule (100 mg total) by mouth daily as needed for mild constipation or moderate constipation. (Patient not taking: Reported on 07/30/2017) 30 capsule 0  . doxycycline (VIBRA-TABS) 100 MG tablet Take 1 tablet (100 mg total) by mouth 2 (two) times daily. (Patient not taking: Reported on 09/02/2017) 20 tablet 0  . mupirocin ointment (BACTROBAN) 2 % Apply bid X 7days prn (Patient not taking: Reported on 09/02/2017) 22 g 3   No facility-administered medications prior to visit.     ROS Review of Systems  Constitutional: Negative for activity change, appetite change and fatigue.  HENT: Negative for congestion, sinus pressure and sore throat.   Eyes: Negative for visual disturbance.  Respiratory: Negative for cough, chest tightness, shortness of breath and wheezing.   Cardiovascular: Negative for chest pain and palpitations.  Gastrointestinal: Negative for abdominal distention, abdominal pain and constipation.  Endocrine: Negative for polydipsia.  Genitourinary: Negative for dysuria and frequency.  Musculoskeletal: Negative for arthralgias and back pain.  Skin: Negative for rash.  Neurological: Negative for tremors, light-headedness and numbness.  Hematological: Does not bruise/bleed easily.  Psychiatric/Behavioral: Negative for agitation and behavioral problems.    Objective:  BP 120/77   Pulse 68   Temp 97.7 F (36.5 C) (Oral)   Ht 5\' 4"  (1.626 m)   Wt 172 lb 12.8 oz (78.4 kg)   SpO2 99%   BMI 29.66 kg/m   BP/Weight 10/27/2017 09/12/2017 09/02/2017  Systolic BP 120 104 126  Diastolic BP 77 63 89  Wt. (Lbs) 172.8 170.2 -  BMI 29.66 29.21 -       Physical Exam  Constitutional: She is oriented to person, place, and time. She appears well-developed and well-nourished.  Cardiovascular: Normal rate, normal heart sounds and intact distal pulses.  No murmur heard. Pulmonary/Chest: Effort normal and breath sounds normal. She has no wheezes. She has no rales. She exhibits no tenderness.  Abdominal: Soft. Bowel sounds are normal. She exhibits no distension and no mass. There is no tenderness.  Musculoskeletal: Normal range of motion.  Neurological: She is alert and oriented to person, place, and time.  Psychiatric: She has a normal mood and affect.     Assessment & Plan:   1. Essential hypertension Controlled Bearing in mind that she took two of her 12.5 mg tablets this morning, I have increased her carvedilol dose to 25 mg twice daily which she will take in addition to amlodipine. Advised to keep a blood pressure log at home and contact the clinic if she develops dizziness. Counseled on blood pressure goal of less than 130/80, low-sodium, DASH diet, medication compliance, 150 minutes of moderate intensity exercise per week. Discussed medication compliance, adverse effects. - carvedilol (COREG) 25 MG tablet; Take 1 tablet (25 mg total) by mouth 2 (two) times daily with a meal.  Dispense: 60 tablet; Refill: 3   Meds ordered this encounter  Medications  . carvedilol (COREG) 25 MG tablet    Sig: Take 1 tablet (25 mg total) by mouth 2 (two) times daily with a meal.    Dispense:  60 tablet    Refill:  3    Discontinue previous dose    Follow-up: Return in about 3 months (around 01/27/2018) for Follow-up on hypertension and anxiety.Hoy Register MD

## 2017-10-27 NOTE — Patient Instructions (Signed)

## 2017-12-19 ENCOUNTER — Other Ambulatory Visit: Payer: Self-pay

## 2017-12-19 MED ORDER — BUSPIRONE HCL 15 MG PO TABS: 15.0000 mg | ORAL_TABLET | Freq: Three times a day (TID) | ORAL | 2 refills | Status: DC

## 2018-01-21 ENCOUNTER — Ambulatory Visit (HOSPITAL_BASED_OUTPATIENT_CLINIC_OR_DEPARTMENT_OTHER): Payer: PRIVATE HEALTH INSURANCE | Admitting: Physician Assistant

## 2018-01-21 ENCOUNTER — Ambulatory Visit (HOSPITAL_COMMUNITY)
Admission: RE | Admit: 2018-01-21 | Discharge: 2018-01-21 | Disposition: A | Discharge status: Home / Self Care | Payer: PRIVATE HEALTH INSURANCE | Source: Ambulatory Visit | Attending: Physician Assistant | Admitting: Physician Assistant

## 2018-01-21 VITALS — BP 119/78 | HR 64 | Temp 99.3°F | Resp 18 | Ht 64.0 in | Wt 174.0 lb

## 2018-01-21 DIAGNOSIS — Z79899 Other long term (current) drug therapy: Secondary | ICD-10-CM | POA: Insufficient documentation

## 2018-01-21 DIAGNOSIS — K219 Gastro-esophageal reflux disease without esophagitis: Secondary | ICD-10-CM | POA: Insufficient documentation

## 2018-01-21 DIAGNOSIS — M62838 Other muscle spasm: Secondary | ICD-10-CM | POA: Diagnosis not present

## 2018-01-21 DIAGNOSIS — I10 Essential (primary) hypertension: Secondary | ICD-10-CM | POA: Insufficient documentation

## 2018-01-21 DIAGNOSIS — F419 Anxiety disorder, unspecified: Secondary | ICD-10-CM | POA: Diagnosis not present

## 2018-01-21 DIAGNOSIS — F329 Major depressive disorder, single episode, unspecified: Secondary | ICD-10-CM | POA: Insufficient documentation

## 2018-01-21 DIAGNOSIS — M25511 Pain in right shoulder: Secondary | ICD-10-CM | POA: Insufficient documentation

## 2018-01-21 DIAGNOSIS — Z882 Allergy status to sulfonamides status: Secondary | ICD-10-CM | POA: Diagnosis not present

## 2018-01-21 DIAGNOSIS — J45909 Unspecified asthma, uncomplicated: Secondary | ICD-10-CM | POA: Insufficient documentation

## 2018-01-21 MED ORDER — METHOCARBAMOL 500 MG PO TABS: ORAL_TABLET | ORAL | 0 refills | Status: DC

## 2018-01-21 MED ORDER — NAPROXEN 500 MG PO TABS: 500.0000 mg | ORAL_TABLET | Freq: Two times a day (BID) | ORAL | 0 refills | Status: DC

## 2018-01-21 NOTE — Progress Notes (Signed)
Patient ID: Kelli Wells, female   DOB: [birth year], 32 y.o.   MRN: [id number]    Kelli Wells, is a 32 y.o. female  ONG:[id number]  GMW:[id number]  DOB - [birth year]  Subjective:  Chief Complaint and HPI: Kelli Wells is a 32 y.o. female here today with Wells shoulder pain for about 1 month.  NKI.  She does do a lot of repetitive lifting at work.  She is L hand dominant. No paresthesias.  No neck pain.  Her shoulder pops and cracks when she performs external rotation.  Pain sometimes awakens her from sleep.  Painful to lay on her Wells arm.    ROS:   Constitutional:  No f/c, No night sweats, No unexplained weight loss. EENT:  No vision changes, No blurry vision, No hearing changes. No mouth, throat, or ear problems.  Respiratory: No cough, No SOB Cardiac: No CP, no palpitations GI:  No abd pain, No N/V/D. GU: No Urinary s/sx Musculoskeletal: Wells shoulder pain Neuro: No headache, no dizziness, no motor weakness.  Skin: No rash Endocrine:  No polydipsia. No polyuria.  Psych: Denies SI/HI  No problems updated.  ALLERGIES: Allergies  Allergen Reactions  . Sulfa Antibiotics Anaphylaxis and Swelling    PAST MEDICAL HISTORY: Past Medical History:  Diagnosis Date  . Anxiety   . Asthma   . Depression   . GERD (gastroesophageal reflux disease)   . Hypertension   . Infection    UTI  . Pregnancy induced hypertension   . Vaginal Pap smear, abnormal    LEEP, normal since    MEDICATIONS AT HOME: Prior to Admission medications   Medication Sig Start Date End Date Taking? Authorizing Provider  albuterol (PROVENTIL HFA;VENTOLIN HFA) 108 (90 Base) MCG/ACT inhaler Inhale 2 puffs into the lungs every 6 (six) hours as needed for wheezing or shortness of breath. 09/12/17  Yes Kelli Register, MD  amLODipine (NORVASC) 10 MG tablet Take 1 tablet (10 mg total) by mouth daily. 09/12/17  Yes Newlin, Odette Horns, MD  busPIRone (BUSPAR) 15 MG tablet Take 1 tablet (15 mg total) by mouth 3 (three) times  daily. 12/19/17  Yes Kelli Register, MD  carvedilol (COREG) 25 MG tablet Take 1 tablet (25 mg total) by mouth 2 (two) times daily with a meal. 10/27/17  Yes Newlin, Enobong, MD  docusate sodium (COLACE) 100 MG capsule Take 1 capsule (100 mg total) by mouth daily as needed for mild constipation or moderate constipation. 02/07/17  Yes Hairston, Mandesia R, FNP  FLUoxetine (PROZAC) 20 MG capsule TAKE 1 TABLET BY MOUTH DAILY. TAKE WITH 1 CAPSULE OF (40 MG TOTAL) BY MOUTH TO TOTAL 60 MG DAILY. 09/12/17  Yes Newlin, Odette Horns, MD  FLUoxetine (PROZAC) 40 MG capsule TAKE 1 CAPSULE BY MOUTH DAILY. TAKE WITH 1 CAPSULE OF (20 MG TOTAL) BY MOUTH TO TOTAL 60 MG DAILY. 09/12/17  Yes Kelli Register, MD  methocarbamol (ROBAXIN) 500 MG tablet 1-2 tid X 10 days then prn pain/spasm 01/21/18   McClung, Marzella Schlein, PA-C  naproxen (NAPROSYN) 500 MG tablet Take 1 tablet (500 mg total) by mouth 2 (two) times daily with a meal. X 10 days then prn pain 01/21/18   Kelli Simmonds, PA-C     Objective:  EXAM:   Vitals:   01/21/18 1605  BP: 119/78  Pulse: 64  Resp: 18  Temp: 99.3 F (37.4 C)  TempSrc: Oral  SpO2: 98%  Weight: 174 lb (78.9 kg)  Height: 5\' 4"  (1.626 m)    General appearance :  A&OX3. NAD. Non-toxic-appearing HEENT: Atraumatic and Normocephalic.  PERRLA. EOM intact.  Neck: supple, no JVD. No cervical lymphadenopathy. No thyromegaly Chest/Lungs:  Breathing-non-labored, Good air entry bilaterally, breath sounds normal without rales, rhonchi, or wheezing  CVS: S1 S2 regular, no murmurs, gallops, rubs  Wells shoulder with full S&ROM except external rotation ~80% normal and there is crepitus of the joint.  No point TTP.  No swelling or overlying erythema.  Neg speed's, Hawkin's, empty can, and Neers.  Normal hand grip and reflexes UE B.   Extremities: Bilateral Lower Ext shows no edema, both legs are warm to touch with = pulse throughout Neurology:  CN II-XII grossly intact, Non focal.   Psych:  TP linear. J/I WNL.  Normal speech. Appropriate eye contact and affect.  Skin:  No Rash  Data Review No results found for: HGBA1C   Assessment & Plan   1. Acute pain of right shoulder With crepitus and limited external/posterior rotation.   - naproxen (NAPROSYN) 500 MG tablet; Take 1 tablet (500 mg total) by mouth 2 (two) times daily with a meal. X 10 days then prn pain  Dispense: 60 tablet; Refill: 0 - DG Shoulder Right; Future - POCT urine pregnancy  2. Muscle spasm - methocarbamol (ROBAXIN) 500 MG tablet; 1-2 tid X 10 days then prn pain/spasm  Dispense: 90 tablet; Refill: 0     Patient have been counseled extensively about nutrition and exercise  Return for keep November appt with Kelli Wells.  The patient was given clear instructions to go to ER or return to medical center if symptoms don't improve, worsen or new problems develop. The patient verbalized understanding. The patient was told to call to get lab results if they haven't heard anything in the next week.     Kelli Co, PA-C [facility] [place], [place] [phone]   01/21/2018, 4:15 PM

## 2018-01-22 ENCOUNTER — Telehealth: Payer: Self-pay | Admitting: Family Medicine

## 2018-01-22 ENCOUNTER — Telehealth: Payer: Self-pay | Admitting: *Deleted

## 2018-01-22 NOTE — Telephone Encounter (Signed)
Medical Assistant left message on patient's home and cell voicemail. Voicemail states to give [place] [facility] back [facility] [place] [facility] at [phone]. Patient is aware of xray being negative and to follow up as planned and use medication relief as prescribed.

## 2018-01-22 NOTE — Telephone Encounter (Signed)
-----   Message from Anders SimmondsAngela M McClung, [place]PA-C sent at 01/22/2018 11:50 AM EDT ----- Please call patiet.  Her shoulder xray was negative.  Take meds and f/up as planned.  Thanks, Georgian CoAngela McClung, PA-C

## 2018-01-22 NOTE — Telephone Encounter (Signed)
Patient asked that you call her back she had a couple of questions

## 2018-01-23 NOTE — Telephone Encounter (Signed)
Patient verified DOB Patient complains of the pain and inquired of the next step since the xray was normal. MA advised patient to utilize medication, ROM, and warm/cold compress for relief. Patient advised to monitor the progression and contact the office or report the symptoms at the next visit in November. Patient had no further questions.

## 2018-01-28 ENCOUNTER — Ambulatory Visit: Payer: Self-pay | Admitting: Family Medicine

## 2018-02-21 ENCOUNTER — Other Ambulatory Visit: Payer: Self-pay | Admitting: Family Medicine

## 2018-02-21 DIAGNOSIS — F419 Anxiety disorder, unspecified: Principal | ICD-10-CM

## 2018-02-21 DIAGNOSIS — F329 Major depressive disorder, single episode, unspecified: Secondary | ICD-10-CM

## 2018-03-18 ENCOUNTER — Encounter: Payer: Self-pay | Admitting: Family Medicine

## 2018-03-18 ENCOUNTER — Ambulatory Visit: Payer: PRIVATE HEALTH INSURANCE | Attending: Family Medicine | Admitting: Family Medicine

## 2018-03-18 DIAGNOSIS — Z882 Allergy status to sulfonamides status: Secondary | ICD-10-CM | POA: Insufficient documentation

## 2018-03-18 DIAGNOSIS — I10 Essential (primary) hypertension: Secondary | ICD-10-CM | POA: Diagnosis not present

## 2018-03-18 DIAGNOSIS — K219 Gastro-esophageal reflux disease without esophagitis: Secondary | ICD-10-CM | POA: Insufficient documentation

## 2018-03-18 DIAGNOSIS — F329 Major depressive disorder, single episode, unspecified: Secondary | ICD-10-CM | POA: Insufficient documentation

## 2018-03-18 DIAGNOSIS — J45909 Unspecified asthma, uncomplicated: Secondary | ICD-10-CM | POA: Insufficient documentation

## 2018-03-18 DIAGNOSIS — Z9049 Acquired absence of other specified parts of digestive tract: Secondary | ICD-10-CM | POA: Insufficient documentation

## 2018-03-18 DIAGNOSIS — F419 Anxiety disorder, unspecified: Secondary | ICD-10-CM | POA: Diagnosis not present

## 2018-03-18 DIAGNOSIS — Z79899 Other long term (current) drug therapy: Secondary | ICD-10-CM | POA: Insufficient documentation

## 2018-03-18 DIAGNOSIS — F32A Depression, unspecified: Secondary | ICD-10-CM

## 2018-03-18 MED ORDER — CARVEDILOL 25 MG PO TABS: 25.0000 mg | ORAL_TABLET | Freq: Two times a day (BID) | ORAL | 3 refills | Status: DC

## 2018-03-18 MED ORDER — BUSPIRONE HCL 15 MG PO TABS: 15.0000 mg | ORAL_TABLET | Freq: Three times a day (TID) | ORAL | 2 refills | Status: DC

## 2018-03-18 MED ORDER — FLUOXETINE HCL 20 MG PO CAPS: ORAL_CAPSULE | ORAL | 3 refills | Status: DC

## 2018-03-18 MED ORDER — AMLODIPINE BESYLATE 10 MG PO TABS: 10.0000 mg | ORAL_TABLET | Freq: Every day | ORAL | 6 refills | Status: DC

## 2018-03-18 MED ORDER — FLUOXETINE HCL 40 MG PO CAPS: ORAL_CAPSULE | ORAL | 3 refills | Status: DC

## 2018-03-18 NOTE — Progress Notes (Signed)
Subjective:  Patient ID: Kelli Wells, female    DOB: 1986/01/19  Age: 32 y.o. MRN: [id number]  CC: Hypertension   HPI Kelli Wells is a 32 year old female with a history of anxiety and depression, hypertension who presents today for a follow-up visit.  She is doing well on amlodipine and carvedilol and denies adverse effects of medications. She does feel her anxiety and depression are not well controlled as she has had episodes where she just breaks down crying.  Denies suicidal ideations or intent.  Denies mood swings.  States she is always fidgety and always jittery and is having a hard time focusing; gives a history of ADD and was receiving treatment while she was back in [place]. She complains of sedation. Currently in school and works part-time and is having a hard time focusing at school.  Past Medical History:  Diagnosis Date  . Anxiety   . Asthma   . Depression   . GERD (gastroesophageal reflux disease)   . Hypertension   . Infection    UTI  . Pregnancy induced hypertension   . Vaginal Pap smear, abnormal    LEEP, normal since    Past Surgical History:  Procedure Laterality Date  . CHOLECYSTECTOMY N/A 12/18/2014   Procedure: LAPAROSCOPIC CHOLECYSTECTOMY WITH INTRAOPERATIVE CHOLANGIOGRAM;  Surgeon: Chevis Pretty III, MD;  Location: [facility];  Service: General;  Laterality: N/A;  . LEEP    . LYMPH GLAND EXCISION      Allergies  Allergen Reactions  . Sulfa Antibiotics Anaphylaxis and Swelling     Outpatient Medications Prior to Visit  Medication Sig Dispense Refill  . albuterol (PROVENTIL HFA;VENTOLIN HFA) 108 (90 Base) MCG/ACT inhaler Inhale 2 puffs into the lungs every 6 (six) hours as needed for wheezing or shortness of breath. 1 Inhaler 2  . methocarbamol (ROBAXIN) 500 MG tablet 1-2 tid X 10 days then prn pain/spasm 90 tablet 0  . naproxen (NAPROSYN) 500 MG tablet Take 1 tablet (500 mg total) by mouth 2 (two) times daily with a meal. X 10 days then prn pain 60  tablet 0  . amLODipine (NORVASC) 10 MG tablet Take 1 tablet (10 mg total) by mouth daily. 30 tablet 6  . busPIRone (BUSPAR) 15 MG tablet Take 1 tablet (15 mg total) by mouth 3 (three) times daily. 90 tablet 2  . carvedilol (COREG) 25 MG tablet Take 1 tablet (25 mg total) by mouth 2 (two) times daily with a meal. 60 tablet 3  . FLUoxetine (PROZAC) 20 MG capsule TAKE 1 CAPSULE BY MOUTH ONCE DAILY **TAKE  WITH  1  CAPSULE  OF  (40MG   TOTAL)  TO  TOTAL  OF  60  MG  DAILY** 30 capsule 2  . FLUoxetine (PROZAC) 40 MG capsule TAKE 1 CAPSULE BY MOUTH ONCE DAILY **TAKE  WITH  1  CAPSULE  OF  (20MG   TOTAL)  TO  TOTAL  OF  60  MG** 30 capsule 2  . docusate sodium (COLACE) 100 MG capsule Take 1 capsule (100 mg total) by mouth daily as needed for mild constipation or moderate constipation. (Patient not taking: Reported on 03/18/2018) 30 capsule 0   No facility-administered medications prior to visit.     ROS Review of Systems  Constitutional: Negative for activity change, appetite change and fatigue.  HENT: Negative for congestion, sinus pressure and sore throat.   Eyes: Negative for visual disturbance.  Respiratory: Negative for cough, chest tightness, shortness of breath and wheezing.   Cardiovascular: Negative  for chest pain and palpitations.  Gastrointestinal: Negative for abdominal distention, abdominal pain and constipation.  Endocrine: Negative for polydipsia.  Genitourinary: Negative for dysuria and frequency.  Musculoskeletal: Negative for arthralgias and back pain.  Skin: Negative for rash.  Neurological: Negative for tremors, light-headedness and numbness.  Hematological: Does not bruise/bleed easily.  Psychiatric/Behavioral:       See hpi    Objective:  BP 122/77   Pulse 71   Temp 98.2 F (36.8 C) (Oral)   Ht 5\' 4"  (1.626 m)   Wt 176 lb 3.2 oz (79.9 kg)   SpO2 99%   BMI 30.24 kg/m   BP/Weight 03/18/2018 01/21/2018 10/27/2017  Systolic BP 122 119 120  Diastolic BP 77 78 77  Wt.  (Lbs) 176.2 174 172.8  BMI 30.24 29.87 29.66      Physical Exam  Constitutional: She is oriented to person, place, and time. She appears well-developed and well-nourished.  Cardiovascular: Normal rate, normal heart sounds and intact distal pulses.  No murmur heard. Pulmonary/Chest: Effort normal and breath sounds normal. She has no wheezes. She has no rales. She exhibits no tenderness.  Abdominal: Soft. Bowel sounds are normal. She exhibits no distension and no mass. There is no tenderness.  Musculoskeletal: Normal range of motion.  Neurological: She is alert and oriented to person, place, and time.  Skin: Skin is warm and dry.  Psychiatric: She has a normal mood and affect.     Assessment & Plan:   1. Anxiety and depression Uncontrolled She is on high doses of Prozac and BuSpar and is experiencing sedation from BuSpar We need to evaluate futher to exclude underlying bipolar disorder Lack of medical coverage limited Psych options to [facility] and she will need to see psych to discuss initiation of ADD meds She will benefit from psychotherapy and has been placed in the LCSW schedule - FLUoxetine (PROZAC) 40 MG capsule; TAKE 1 CAPSULE BY MOUTH ONCE DAILY **TAKE  WITH  1  CAPSULE  OF  (20MG )  TO  TOTAL  OF  60  MG**  Dispense: 30 capsule; Refill: 3 - FLUoxetine (PROZAC) 20 MG capsule; TAKE 1 CAPSULE BY MOUTH ONCE DAILY **TAKE  WITH  1  CAPSULE  OF  (40MG  )  TO  TOTAL  OF  60  MG DAILY**  Dispense: 30 capsule; Refill: 3  2. Essential hypertension Controlled Counseled on blood pressure goal of less than 130/80, low-sodium, DASH diet, medication compliance, 150 minutes of moderate intensity exercise per week. Discussed medication compliance, adverse effects. - carvedilol (COREG) 25 MG tablet; Take 1 tablet (25 mg total) by mouth 2 (two) times daily with a meal.  Dispense: 60 tablet; Refill: 3   Meds ordered this encounter  Medications  . FLUoxetine (PROZAC) 40 MG capsule    Sig: TAKE  1 CAPSULE BY MOUTH ONCE DAILY **TAKE  WITH  1  CAPSULE  OF  (20MG )  TO  TOTAL  OF  60  MG**    Dispense:  30 capsule    Refill:  3    Please consider 90 day supplies to promote better adherence  . FLUoxetine (PROZAC) 20 MG capsule    Sig: TAKE 1 CAPSULE BY MOUTH ONCE DAILY **TAKE  WITH  1  CAPSULE  OF  (40MG  )  TO  TOTAL  OF  60  MG DAILY**    Dispense:  30 capsule    Refill:  3    Please consider 90 day supplies to promote better adherence  .  carvedilol (COREG) 25 MG tablet    Sig: Take 1 tablet (25 mg total) by mouth 2 (two) times daily with a meal.    Dispense:  60 tablet    Refill:  3  . busPIRone (BUSPAR) 15 MG tablet    Sig: Take 1 tablet (15 mg total) by mouth 3 (three) times daily.    Dispense:  90 tablet    Refill:  2  . amLODipine (NORVASC) 10 MG tablet    Sig: Take 1 tablet (10 mg total) by mouth daily.    Dispense:  30 tablet    Refill:  6    Follow-up: Return in about 3 months (around 06/18/2018) for Follow-up of chronic medical conditions.   Hoy Register MD

## 2018-03-18 NOTE — Patient Instructions (Signed)

## 2018-04-22 ENCOUNTER — Telehealth: Payer: Self-pay | Admitting: Licensed Clinical Social Worker

## 2018-04-22 NOTE — Telephone Encounter (Signed)
LCSWA attempted to contact pt to follow up on behavioral health screens from previous appointment. LCSWA left message for a return call.  

## 2018-04-24 ENCOUNTER — Telehealth: Payer: Self-pay | Admitting: Licensed Clinical Social Worker

## 2018-04-24 NOTE — Telephone Encounter (Signed)
Call placed to patient in attempt to follow up on screenings from previous appointment with PCP. LCSWA introduced self and [facility]. Pt was informed of consult from PCP to assess for behavioral health and/or psychosocial needs.   Pt stated that she is completing finals this week and is unable to meet. She agreed to review her schedule and make an appointment with LCSWA in 1-2 weeks. No additional concerns noted.

## 2018-06-22 ENCOUNTER — Encounter: Payer: Self-pay | Admitting: Family Medicine

## 2018-06-22 ENCOUNTER — Ambulatory Visit: Payer: PRIVATE HEALTH INSURANCE | Attending: Family Medicine | Admitting: Family Medicine

## 2018-06-22 VITALS — BP 132/83 | HR 71 | Temp 98.3°F | Ht 64.0 in | Wt 185.0 lb

## 2018-06-22 DIAGNOSIS — J45909 Unspecified asthma, uncomplicated: Secondary | ICD-10-CM | POA: Insufficient documentation

## 2018-06-22 DIAGNOSIS — Z79899 Other long term (current) drug therapy: Secondary | ICD-10-CM | POA: Insufficient documentation

## 2018-06-22 DIAGNOSIS — F419 Anxiety disorder, unspecified: Secondary | ICD-10-CM | POA: Diagnosis not present

## 2018-06-22 DIAGNOSIS — I10 Essential (primary) hypertension: Secondary | ICD-10-CM | POA: Diagnosis not present

## 2018-06-22 DIAGNOSIS — F329 Major depressive disorder, single episode, unspecified: Secondary | ICD-10-CM | POA: Insufficient documentation

## 2018-06-22 DIAGNOSIS — F32A Depression, unspecified: Secondary | ICD-10-CM

## 2018-06-22 DIAGNOSIS — Z791 Long term (current) use of non-steroidal anti-inflammatories (NSAID): Secondary | ICD-10-CM | POA: Insufficient documentation

## 2018-06-22 DIAGNOSIS — Z9049 Acquired absence of other specified parts of digestive tract: Secondary | ICD-10-CM | POA: Insufficient documentation

## 2018-06-22 DIAGNOSIS — Z882 Allergy status to sulfonamides status: Secondary | ICD-10-CM | POA: Insufficient documentation

## 2018-06-22 DIAGNOSIS — Z8249 Family history of ischemic heart disease and other diseases of the circulatory system: Secondary | ICD-10-CM | POA: Insufficient documentation

## 2018-06-22 DIAGNOSIS — Z803 Family history of malignant neoplasm of breast: Secondary | ICD-10-CM | POA: Insufficient documentation

## 2018-06-22 MED ORDER — SERTRALINE HCL 100 MG PO TABS: 100.0000 mg | ORAL_TABLET | Freq: Every day | ORAL | 3 refills | Status: DC

## 2018-06-22 NOTE — Progress Notes (Signed)
Subjective:  Patient ID: Kelli Wells, female    DOB: 1986/01/18  Age: 33 y.o. MRN: [id number]  CC: Hypertension   HPI Kelli Wells is a 33 year old female with a history of hypertension, anxiety and depression who presents today for follow-up visit. She has been compliant with her antihypertensives but discontinued BuSpar due to excessive sedation and also felt that the combination of BuSpar and Prozac have been ineffective.  She feels her depressive symptoms have not been controlled. Referred to counseling in the past but due to her tight work schedule combined with schooling and caring for her kids she has been unable to make it for counseling. Denies suicidal ideations or intent.  Past Medical History:  Diagnosis Date  . Anxiety   . Asthma   . Depression   . GERD (gastroesophageal reflux disease)   . Hypertension   . Infection    UTI  . Pregnancy induced hypertension   . Vaginal Pap smear, abnormal    LEEP, normal since    Past Surgical History:  Procedure Laterality Date  . CHOLECYSTECTOMY N/A 12/18/2014   Procedure: LAPAROSCOPIC CHOLECYSTECTOMY WITH INTRAOPERATIVE CHOLANGIOGRAM;  Surgeon: Chevis Pretty III, MD;  Location: [facility] OR;  Service: General;  Laterality: N/A;  . LEEP    . LYMPH GLAND EXCISION      Family History  Problem Relation Age of Onset  . Hypertension Maternal Grandmother   . Cancer Maternal Grandmother        breast  . Hypertension Maternal Grandfather   . Kidney disease Paternal Grandmother   . Heart disease Paternal Grandfather   . Hypertension Mother   . Cancer Mother        rectal  . Hypertension Father   . Hearing loss Neg Hx     Allergies  Allergen Reactions  . Sulfa Antibiotics Anaphylaxis and Swelling    Outpatient Medications Prior to Visit  Medication Sig Dispense Refill  . albuterol (PROVENTIL HFA;VENTOLIN HFA) 108 (90 Base) MCG/ACT inhaler Inhale 2 puffs into the lungs every 6 (six) hours as needed for wheezing or shortness of  breath. 1 Inhaler 2  . amLODipine (NORVASC) 10 MG tablet Take 1 tablet (10 mg total) by mouth daily. 30 tablet 6  . carvedilol (COREG) 25 MG tablet Take 1 tablet (25 mg total) by mouth 2 (two) times daily with a meal. 60 tablet 3  . methocarbamol (ROBAXIN) 500 MG tablet 1-2 tid X 10 days then prn pain/spasm 90 tablet 0  . naproxen (NAPROSYN) 500 MG tablet Take 1 tablet (500 mg total) by mouth 2 (two) times daily with a meal. X 10 days then prn pain 60 tablet 0  . busPIRone (BUSPAR) 15 MG tablet Take 1 tablet (15 mg total) by mouth 3 (three) times daily. 90 tablet 2  . FLUoxetine (PROZAC) 20 MG capsule TAKE 1 CAPSULE BY MOUTH ONCE DAILY **TAKE  WITH  1  CAPSULE  OF  (40MG  )  TO  TOTAL  OF  60  MG DAILY** 30 capsule 3  . FLUoxetine (PROZAC) 40 MG capsule TAKE 1 CAPSULE BY MOUTH ONCE DAILY **TAKE  WITH  1  CAPSULE  OF  (20MG )  TO  TOTAL  OF  60  MG** 30 capsule 3  . docusate sodium (COLACE) 100 MG capsule Take 1 capsule (100 mg total) by mouth daily as needed for mild constipation or moderate constipation. (Patient not taking: Reported on 03/18/2018) 30 capsule 0   No facility-administered medications prior to visit.  ROS Review of Systems General: negative for fever, weight loss, appetite change Eyes: no visual symptoms. ENT: no ear symptoms, no sinus tenderness, no nasal congestion or sore throat. Neck: no pain  Respiratory: no wheezing, shortness of breath, cough Cardiovascular: no chest pain, no dyspnea on exertion, no pedal edema, no orthopnea. Gastrointestinal: no abdominal pain, no diarrhea, no constipation Genito-Urinary: no urinary frequency, no dysuria, no polyuria. Hematologic: no bruising Endocrine: no cold or heat intolerance Neurological: no headaches, no seizures, no tremors Musculoskeletal: no joint pains, no joint swelling Skin: no pruritus, no rash. Psychological: no depression, no anxiety,    Objective:  BP 132/83   Pulse 71   Temp 98.3 F (36.8 C) (Oral)   Ht  5\' 4"  (1.626 m)   Wt 185 lb (83.9 kg)   SpO2 99%   BMI 31.76 kg/m   BP/Weight 06/22/2018 03/18/2018 01/21/2018  Systolic BP 132 122 119  Diastolic BP 83 77 78  Wt. (Lbs) 185 176.2 174  BMI 31.76 30.24 29.87      Physical Exam Constitutional: normal appearing,  Eyes: PERRLA HEENT: Head is atraumatic, normal sinuses, normal oropharynx, normal appearing tonsils and palate, tympanic membrane is normal bilaterally. Neck: normal range of motion, no thyromegaly, no JVD Cardiovascular: normal rate and rhythm, normal heart sounds, no murmurs, rub or gallop, no pedal edema Respiratory: Normal breath sounds, clear to auscultation bilaterally, no wheezes, no rales, no rhonchi Abdomen: soft, not tender to palpation, normal bowel sounds, no enlarged organs Musculoskeletal: Full ROM, no tenderness in joints Skin: warm and dry, no lesions. Neurological: alert, oriented x3, cranial nerves I-XII grossly intact , normal motor strength, normal sensation. Psychological: normal mood.   CMP Latest Ref Rng & Units 09/01/2017 09/12/2016 01/01/2016  Glucose 65 - 99 mg/dL 76 82 80  BUN 6 - 20 mg/dL 7 9 10   Creatinine 0.44 - 1.00 mg/dL 1.610.77 0.960.79 0.450.86  Sodium 135 - 145 mmol/L 139 137 136  Potassium 3.5 - 5.1 mmol/L 3.6 4.4 4.0  Chloride 101 - 111 mmol/L 108 102 103  CO2 22 - 32 mmol/L 22 21 24   Calcium 8.9 - 10.3 mg/dL 9.3 9.9 9.8  Total Protein 6.0 - 8.5 g/dL - 6.9 7.4  Total Bilirubin 0.0 - 1.2 mg/dL - 0.3 0.6  Alkaline Phos 39 - 117 IU/L - 94 107  AST 0 - 40 IU/L - 14 15  ALT 0 - 32 IU/L - 12 13    Lipid Panel     Component Value Date/Time   CHOL 191 09/26/2017 0906   TRIG 47 09/26/2017 0906   HDL 61 09/26/2017 0906   CHOLHDL 3.1 09/26/2017 0906   CHOLHDL 3.4 01/01/2016 1020   VLDL 10 01/01/2016 1020   LDLCALC 121 (H) 09/26/2017 0906    CBC    Component Value Date/Time   WBC 6.9 09/01/2017 1722   RBC 4.54 09/01/2017 1722   HGB 13.2 09/01/2017 1722   HGB 13.1 11/07/2016 1649   HCT 39.3  09/01/2017 1722   HCT 39.1 11/07/2016 1649   PLT 295 09/01/2017 1722   PLT 355 11/07/2016 1649   MCV 86.6 09/01/2017 1722   MCV 87 11/07/2016 1649   MCV 88 03/22/2013 0320   MCH 29.1 09/01/2017 1722   MCHC 33.6 09/01/2017 1722   RDW 13.3 09/01/2017 1722   RDW 13.7 11/07/2016 1649   RDW 12.8 03/22/2013 0320   LYMPHSABS 2.8 11/07/2016 1649   EOSABS 0.2 11/07/2016 1649   BASOSABS 0.0 11/07/2016 1649  [facility] 06/22/2018 03/18/2018 10/27/2017 09/12/2017 07/30/2017  Decreased Interest 1 2 2 1 2   Down, Depressed, Hopeless 2 2 2 1 2   PHQ - 2 Score 3 4 4 2 4   Altered sleeping 1 2 3 3 3   Tired, decreased energy 3 2 2 3 3   Change in appetite 3 2 1 3 1   Feeling bad or failure about yourself  1 2 2 1 2   Trouble concentrating 2 2 2 1 1   Moving slowly or fidgety/restless 2 2 2 2 3   Suicidal thoughts 0 0 0 0 0  PHQ-9 Score 15 16 16 15 17   Some recent data might be hidden    Assessment & Plan:   1. Essential hypertension Controlled continue current regimen Counseled on blood pressure goal of less than 130/80, low-sodium, DASH diet, medication compliance, 150 minutes of moderate intensity exercise per week. Discussed medication compliance, adverse effects. - Basic Metabolic Panel  2. Anxiety and depression Uncontrolled Complains of sedation with BuSpar and had discontinued both BuSpar and Prozac prior to today's visit Commence Zoloft She will benefit from counseling but we will need to make at the time to go for the sessions.   Meds ordered this encounter  Medications  . sertraline (ZOLOFT) 100 MG tablet    Sig: Take 1 tablet (100 mg total) by mouth daily.    Dispense:  30 tablet    Refill:  3    Discontinue Prozac and Buspar    Follow-up: Return in about 3 months (around 09/20/2018) for follow up of chronic medical conditions.       Hoy RegisterEnobong Meagan Spease, MD, FAAFP. [facility], [place] [phone]   06/22/2018, 5:35  PM

## 2018-06-22 NOTE — Patient Instructions (Signed)

## 2018-06-23 LAB — BASIC METABOLIC PANEL
BUN/Creatinine Ratio: 13 (ref 9–23)
BUN: 9 mg/dL (ref 6–20)
CO2: 21 mmol/L (ref 20–29)
CREATININE: 0.68 mg/dL (ref 0.57–1.00)
Calcium: 9.2 mg/dL (ref 8.7–10.2)
Chloride: 104 mmol/L (ref 96–106)
GFR, EST AFRICAN AMERICAN: 132 mL/min/{1.73_m2} (ref >59)
GFR, EST NON AFRICAN AMERICAN: 114 mL/min/{1.73_m2} (ref >59)
Glucose: 84 mg/dL (ref 65–99)
Potassium: 4 mmol/L (ref 3.5–5.2)
Sodium: 137 mmol/L (ref 134–144)

## 2018-06-25 ENCOUNTER — Telehealth: Payer: Self-pay

## 2018-06-25 NOTE — Telephone Encounter (Signed)
-----   Message from Hoy Register, MD sent at 06/23/2018  1:26 PM EST ----- Please inform the patient that labs are normal. Thank you.

## 2018-06-25 NOTE — Telephone Encounter (Signed)
Patient was called and informed of lab results via voicemail. 

## 2018-09-16 ENCOUNTER — Ambulatory Visit: Payer: PRIVATE HEALTH INSURANCE | Attending: Family Medicine | Admitting: Family Medicine

## 2018-09-16 ENCOUNTER — Other Ambulatory Visit: Payer: Self-pay

## 2018-09-29 ENCOUNTER — Ambulatory Visit: Payer: PRIVATE HEALTH INSURANCE | Attending: Family Medicine | Admitting: Family Medicine

## 2018-09-29 ENCOUNTER — Other Ambulatory Visit: Payer: Self-pay

## 2018-09-29 DIAGNOSIS — M25511 Pain in right shoulder: Secondary | ICD-10-CM

## 2018-09-29 DIAGNOSIS — M62838 Other muscle spasm: Secondary | ICD-10-CM

## 2018-09-29 DIAGNOSIS — Z8709 Personal history of other diseases of the respiratory system: Secondary | ICD-10-CM | POA: Diagnosis not present

## 2018-09-29 DIAGNOSIS — I10 Essential (primary) hypertension: Secondary | ICD-10-CM

## 2018-09-29 MED ORDER — ALBUTEROL SULFATE HFA 108 (90 BASE) MCG/ACT IN AERS: 2.0000 | INHALATION_SPRAY | Freq: Four times a day (QID) | RESPIRATORY_TRACT | 2 refills | Status: DC | PRN

## 2018-09-29 MED ORDER — SERTRALINE HCL 100 MG PO TABS
100.0000 mg | ORAL_TABLET | Freq: Every day | ORAL | 6 refills | Status: DC
Start: 2018-09-29 — End: 2019-06-21

## 2018-09-29 MED ORDER — AMLODIPINE BESYLATE 10 MG PO TABS: 10.0000 mg | ORAL_TABLET | Freq: Every day | ORAL | 6 refills | Status: DC

## 2018-09-29 MED ORDER — METHOCARBAMOL 500 MG PO TABS: 500.0000 mg | ORAL_TABLET | Freq: Three times a day (TID) | ORAL | 1 refills | Status: DC | PRN

## 2018-09-29 MED ORDER — NAPROXEN 500 MG PO TABS: 500.0000 mg | ORAL_TABLET | Freq: Two times a day (BID) | ORAL | 6 refills | Status: AC

## 2018-09-29 MED ORDER — CARVEDILOL 25 MG PO TABS: 25.0000 mg | ORAL_TABLET | Freq: Two times a day (BID) | ORAL | 6 refills | Status: DC

## 2018-09-29 MED FILL — METHOCARBAMOL 500 MG TABS: 500 | 30 days supply | Qty: 90 | Fill #0

## 2018-09-29 MED FILL — NAPROXEN 500 MG TABLET: 500 | 30 days supply | Qty: 60 | Fill #0

## 2018-09-29 MED FILL — ALBUTEROL SULFATE HFA 108 (: 108 (90 BAS | 25 days supply | Qty: 18 | Fill #0

## 2018-09-29 NOTE — Progress Notes (Signed)
Virtual Visit via Telephone Note  I connected with Kelli Wells, on 09/29/2018 at 10:28 AM by telephone due to the COVID-19 pandemic and verified that I am speaking with the correct person using two identifiers.   Consent: I discussed the limitations, risks, security and privacy concerns of performing an evaluation and management service by telephone and the availability of in person appointments. I also discussed with the patient that there may be a patient responsible charge related to this service. The patient expressed understanding and agreed to proceed.   Location of Patient: Home  Location of Provider: Clinic   Persons participating in Telemedicine visit: Lamyia Asaro  Melba CoonAlicia Farrington-CMA Dr. Nelwyn SalisburyNewlin-PCP     History of Present Illness: Kelli Wells is a 33 year old female with a history of hypertension, anxiety and depression who presents today for follow-up visit. At her last visit she was switched from Prozac and BuSpar to Zoloft due to sedation and  ineffectiveness but today reports improvement with Zoloft in the absence of sedation.  She loves Zoloft.  Her depression and anxiety are controlled. She lost her job due to the COVID-19 pandemic but is still enrolled in school and reports feeling at peace and is able to cope. She has been unable to check her blood pressure but it read 132/83 at her last visit in 06/2018.  She endorses compliance with her medications and denies adverse effects from her medications. She has no acute concerns today.  Past Medical History:  Diagnosis Date  . Anxiety   . Asthma   . Depression   . GERD (gastroesophageal reflux disease)   . Hypertension   . Infection    UTI  . Pregnancy induced hypertension   . Vaginal Pap smear, abnormal    LEEP, normal since   Allergies  Allergen Reactions  . Sulfa Antibiotics Anaphylaxis and Swelling    Current Outpatient Medications on File Prior to Visit  Medication Sig Dispense Refill  .  albuterol (PROVENTIL HFA;VENTOLIN HFA) 108 (90 Base) MCG/ACT inhaler Inhale 2 puffs into the lungs every 6 (six) hours as needed for wheezing or shortness of breath. 1 Inhaler 2  . amLODipine (NORVASC) 10 MG tablet Take 1 tablet (10 mg total) by mouth daily. 30 tablet 6  . carvedilol (COREG) 25 MG tablet Take 1 tablet (25 mg total) by mouth 2 (two) times daily with a meal. 60 tablet 3  . methocarbamol (ROBAXIN) 500 MG tablet 1-2 tid X 10 days then prn pain/spasm 90 tablet 0  . sertraline (ZOLOFT) 100 MG tablet Take 1 tablet (100 mg total) by mouth daily. 30 tablet 3  . docusate sodium (COLACE) 100 MG capsule Take 1 capsule (100 mg total) by mouth daily as needed for mild constipation or moderate constipation. (Patient not taking: Reported on 03/18/2018) 30 capsule 0  . naproxen (NAPROSYN) 500 MG tablet Take 1 tablet (500 mg total) by mouth 2 (two) times daily with a meal. X 10 days then prn pain (Patient not taking: Reported on 09/29/2018) 60 tablet 0   No current facility-administered medications on file prior to visit.     Observations/Objective: Awake, alert, oriented x3 Not in acute distress   CMP Latest Ref Rng & Units 06/22/2018 09/01/2017 09/12/2016  Glucose 65 - 99 mg/dL 84 76 82  BUN 6 - 20 mg/dL 9 7 9   Creatinine 0.57 - 1.00 mg/dL 1.610.68 0.960.77 0.450.79  Sodium 134 - 144 mmol/L 137 139 137  Potassium 3.5 - 5.2 mmol/L 4.0 3.6 4.4  Chloride 96 -  106 mmol/L 104 108 102  CO2 20 - 29 mmol/L 21 22 21   Calcium 8.7 - 10.2 mg/dL 9.2 9.3 9.9  Total Protein 6.0 - 8.5 g/dL - - 6.9  Total Bilirubin 0.0 - 1.2 mg/dL - - 0.3  Alkaline Phos 39 - 117 IU/L - - 94  AST 0 - 40 IU/L - - 14  ALT 0 - 32 IU/L - - 12      Assessment and Plan: 1. Essential hypertension Controlled Counseled on blood pressure goal of less than 130/80, low-sodium, DASH diet, medication compliance, 150 minutes of moderate intensity exercise per week. Discussed medication compliance, adverse effects. - carvedilol (COREG) 25 MG  tablet; Take 1 tablet (25 mg total) by mouth 2 (two) times daily with a meal.  Dispense: 60 tablet; Refill: 6  2. History of asthma No recent exacerbation - albuterol (VENTOLIN HFA) 108 (90 Base) MCG/ACT inhaler; Inhale 2 puffs into the lungs every 6 (six) hours as needed for wheezing or shortness of breath.  Dispense: 1 Inhaler; Refill: 2  3. Muscle spasm Stable - methocarbamol (ROBAXIN) 500 MG tablet; Take 1 tablet (500 mg total) by mouth every 8 (eight) hours as needed for muscle spasms.  Dispense: 90 tablet; Refill: 1  4. Acute pain of right shoulder Stable - naproxen (NAPROSYN) 500 MG tablet; Take 1 tablet (500 mg total) by mouth 2 (two) times daily with a meal.  Dispense: 60 tablet; Refill: 6   Follow Up Instructions: 3 Months   I discussed the assessment and treatment plan with the patient. The patient was provided an opportunity to ask questions and all were answered. The patient agreed with the plan and demonstrated an understanding of the instructions.   The patient was advised to call back or seek an in-person evaluation if the symptoms worsen or if the condition fails to improve as anticipated.     I provided 20 minutes total of non-face-to-face time during this encounter including median intraservice time, reviewing previous notes, labs, imaging, medications and explaining diagnosis and management.     Hoy Register, MD, FAAFP. [facility] [place], [place] [phone]   09/29/2018, 10:28 AM

## 2018-09-29 NOTE — Progress Notes (Signed)
Patient has been called and DOB has been verified. Patient has been screened and transferred to PCP to start phone visit.     

## 2018-10-20 ENCOUNTER — Other Ambulatory Visit: Payer: Self-pay

## 2018-10-20 ENCOUNTER — Encounter: Payer: Self-pay | Admitting: Family Medicine

## 2018-10-20 ENCOUNTER — Ambulatory Visit: Payer: PRIVATE HEALTH INSURANCE | Attending: Family Medicine | Admitting: Family Medicine

## 2018-10-20 DIAGNOSIS — J018 Other acute sinusitis: Secondary | ICD-10-CM

## 2018-10-20 MED ORDER — LORATADINE 10 MG PO TABS: 10.0000 mg | ORAL_TABLET | Freq: Every day | ORAL | 1 refills | Status: AC

## 2018-10-20 MED ORDER — FLUTICASONE PROPIONATE 50 MCG/ACT NA SUSP: 2.0000 | Freq: Every day | NASAL | 1 refills | Status: DC

## 2018-10-20 MED FILL — FLUTICASONE PROP 50 MCG SPR: 50 | 30 days supply | Qty: 16 | Fill #0

## 2018-10-20 NOTE — Patient Instructions (Signed)

## 2018-10-20 NOTE — Progress Notes (Signed)
Patient has been called and DOB has been verified. Patient has been screened and transferred to PCP to start phone visit.     

## 2018-10-20 NOTE — Progress Notes (Signed)
Virtual Visit via Telephone Note  I connected with [name], on 10/20/2018 at 10:00 AM by telephone due to the COVID-19 pandemic and verified that I am speaking with the correct person using two identifiers.   Consent: I discussed the limitations, risks, security and privacy concerns of performing an evaluation and management service by telephone and the availability of in person appointments. I also discussed with the patient that there may be a patient responsible charge related to this service. The patient expressed understanding and agreed to proceed.   Location of Patient: Home  Location of Provider: Clinic   Persons participating in Telemedicine visit: Elena Lefevers Melba CoonAlicia Farrington-CMA Dr. Nelwyn SalisburyNewlin-PCP     History of Present Illness: Kelli SquibbShaniece Wells is a 33 year old female with a history of hypertension, anxiety and depression who presents today for an acute visit. She complains of sinus congestion with alternating rhinorrhea, sinus pressure, dry nose with intermittent bleeding, irritation for the last couple of days.  She obtained OTC sinus medications yesterday and reports waking up today feeling a bit better.  She denied fever or myalgia. She was previously on medications for seasonal allergies and reports she did not do well on Zyrtec.  Her asthma has been stable with no flares.   Past Medical History:  Diagnosis Date  . Anxiety   . Asthma   . Depression   . GERD (gastroesophageal reflux disease)   . Hypertension   . Infection    UTI  . Pregnancy induced hypertension   . Vaginal Pap smear, abnormal    LEEP, normal since   Allergies  Allergen Reactions  . Sulfa Antibiotics Anaphylaxis and Swelling    Current Outpatient Medications on File Prior to Visit  Medication Sig Dispense Refill  . albuterol (VENTOLIN HFA) 108 (90 Base) MCG/ACT inhaler Inhale 2 puffs into the lungs every 6 (six) hours as needed for wheezing or shortness of breath. 1 Inhaler 2  .  amLODipine (NORVASC) 10 MG tablet Take 1 tablet (10 mg total) by mouth daily. 30 tablet 6  . carvedilol (COREG) 25 MG tablet Take 1 tablet (25 mg total) by mouth 2 (two) times daily with a meal. 60 tablet 6  . methocarbamol (ROBAXIN) 500 MG tablet Take 1 tablet (500 mg total) by mouth every 8 (eight) hours as needed for muscle spasms. 90 tablet 1  . naproxen (NAPROSYN) 500 MG tablet Take 1 tablet (500 mg total) by mouth 2 (two) times daily with a meal. 60 tablet 6  . sertraline (ZOLOFT) 100 MG tablet Take 1 tablet (100 mg total) by mouth daily. 30 tablet 6  . docusate sodium (COLACE) 100 MG capsule Take 1 capsule (100 mg total) by mouth daily as needed for mild constipation or moderate constipation. (Patient not taking: Reported on 03/18/2018) 30 capsule 0   No current facility-administered medications on file prior to visit.     Observations/Objective: Weak, alert, oriented x3 Not in acute distress  Assessment and Plan: 1. Acute non-recurrent sinusitis of other sinus Discussed nasal saline rinses No antibiotic indicated at this time but advised to call back if symptoms worsen - loratadine (CLARITIN) 10 MG tablet; Take 1 tablet (10 mg total) by mouth daily.  Dispense: 30 tablet; Refill: 1 - fluticasone (FLONASE) 50 MCG/ACT nasal spray; Place 2 sprays into both nostrils daily.  Dispense: 16 g; Refill: 1   Follow Up Instructions: Return for medical conditions, keep previously scheduled appointment.    I discussed the assessment and treatment plan with the patient. The  patient was provided an opportunity to ask questions and all were answered. The patient agreed with the plan and demonstrated an understanding of the instructions.   The patient was advised to call back or seek an in-person evaluation if the symptoms worsen or if the condition fails to improve as anticipated.     I provided 10 minutes total of non-face-to-face time during this encounter including median intraservice time,  reviewing previous notes, labs, imaging, medications, management and patient verbalized understanding.     Charlott Rakes, MD, FAAFP. [facility] and [place], [place]   10/20/2018, 10:00 AM

## 2018-11-25 ENCOUNTER — Telehealth: Payer: Self-pay | Admitting: Family Medicine

## 2019-02-05 ENCOUNTER — Encounter (HOSPITAL_COMMUNITY): Payer: Self-pay

## 2019-02-05 ENCOUNTER — Ambulatory Visit (HOSPITAL_COMMUNITY)
Admission: EM | Admit: 2019-02-05 | Discharge: 2019-02-05 | Disposition: A | Discharge status: Home / Self Care | Payer: Self-pay | Attending: Emergency Medicine | Admitting: Emergency Medicine

## 2019-02-05 ENCOUNTER — Other Ambulatory Visit: Payer: Self-pay

## 2019-02-05 DIAGNOSIS — L02219 Cutaneous abscess of trunk, unspecified: Secondary | ICD-10-CM

## 2019-02-05 DIAGNOSIS — L0291 Cutaneous abscess, unspecified: Secondary | ICD-10-CM

## 2019-02-05 MED ORDER — DOXYCYCLINE HYCLATE 100 MG PO CAPS: 100.0000 mg | ORAL_CAPSULE | Freq: Two times a day (BID) | ORAL | 0 refills | Status: DC

## 2019-02-05 NOTE — ED Triage Notes (Signed)
Patient report having a cyst under her right breast x 3 weeks, swollen, painful.

## 2019-02-05 NOTE — ED Provider Notes (Signed)
[facility]    CSN: [id number] Arrival date & time: 02/05/19  1726      History   Chief Complaint Chief Complaint  Patient presents with  . Cyst    HPI Kelli Wells is a 33 y.o. female.   Patient presents with an abscess under her right breast x3 weeks that is painful and red.  No drainage.  She denies fever, chills, or other symptoms.  She has a history of frequent abscesses.  LMP: implant.  The history is provided by the patient.    Past Medical History:  Diagnosis Date  . Anxiety   . Asthma   . Depression   . GERD (gastroesophageal reflux disease)   . Hypertension   . Infection    UTI  . Pregnancy induced hypertension   . Vaginal Pap smear, abnormal    LEEP, normal since    Patient Active Problem List   Diagnosis Date Noted  . Hypertension 12/14/2015  . Anxiety and depression 12/14/2015  . Cholecystitis with cholelithiasis 12/17/2014  . Pregnancy 05/22/2014  . Postpartum state 05/12/2014  . Chronic hypertension in pregnancy   . [redacted] weeks gestation of pregnancy   . Non-reactive NST (non-stress test)   . [redacted] weeks gestation of pregnancy     Past Surgical History:  Procedure Laterality Date  . CHOLECYSTECTOMY N/A 12/18/2014   Procedure: LAPAROSCOPIC CHOLECYSTECTOMY WITH INTRAOPERATIVE CHOLANGIOGRAM;  Surgeon: Autumn Messing III, MD;  Location: [facility];  Service: General;  Laterality: N/A;  . LEEP    . LYMPH GLAND EXCISION      OB History    Gravida  2   Para  2   Term  2   Preterm  0   AB  0   Living  2     SAB  0   TAB  0   Ectopic  0   Multiple  0   Live Births  2            Home Medications    Prior to Admission medications   Medication Sig Start Date End Date Taking? Authorizing Provider  albuterol (VENTOLIN HFA) 108 (90 Base) MCG/ACT inhaler Inhale 2 puffs into the lungs every 6 (six) hours as needed for wheezing or shortness of breath. 09/29/18   Charlott Rakes, MD  amLODipine (NORVASC) 10 MG tablet Take 1 tablet (10  mg total) by mouth daily. 09/29/18   Charlott Rakes, MD  carvedilol (COREG) 25 MG tablet Take 1 tablet (25 mg total) by mouth 2 (two) times daily with a meal. 09/29/18   Charlott Rakes, MD  docusate sodium (COLACE) 100 MG capsule Take 1 capsule (100 mg total) by mouth daily as needed for mild constipation or moderate constipation. Patient not taking: Reported on 03/18/2018 02/07/17   Alfonse Spruce, FNP  doxycycline (VIBRAMYCIN) 100 MG capsule Take 1 capsule (100 mg total) by mouth 2 (two) times daily. 02/05/19   Sharion Balloon, NP  fluticasone (FLONASE) 50 MCG/ACT nasal spray Place 2 sprays into both nostrils daily. 10/20/18   Charlott Rakes, MD  loratadine (CLARITIN) 10 MG tablet Take 1 tablet (10 mg total) by mouth daily. 10/20/18   Charlott Rakes, MD  methocarbamol (ROBAXIN) 500 MG tablet Take 1 tablet (500 mg total) by mouth every 8 (eight) hours as needed for muscle spasms. 09/29/18   Charlott Rakes, MD  naproxen (NAPROSYN) 500 MG tablet Take 1 tablet (500 mg total) by mouth 2 (two) times daily with a meal. 09/29/18  Hoy Register, MD  sertraline (ZOLOFT) 100 MG tablet Take 1 tablet (100 mg total) by mouth daily. 09/29/18   Hoy Register, MD    Family History Family History  Problem Relation Age of Onset  . Hypertension Maternal Grandmother   . Cancer Maternal Grandmother        breast  . Hypertension Maternal Grandfather   . Kidney disease Paternal Grandmother   . Heart disease Paternal Grandfather   . Hypertension Mother   . Cancer Mother        rectal  . Hypertension Father   . Hearing loss Neg Hx     Social History Social History   Tobacco Use  . Smoking status: Current Every Day Smoker    Packs/day: 0.25    Types: Cigarettes  . Smokeless tobacco: Never Used  . Tobacco comment: quit June 2015  Substance Use Topics  . Alcohol use: Yes    Alcohol/week: 2.0 - 3.0 standard drinks    Types: 2 - 3 Glasses of wine per week    Comment: occas  . Drug use: No      Allergies   Sulfa antibiotics   Review of Systems Review of Systems  Constitutional: Negative for chills and fever.  HENT: Negative for ear pain and sore throat.   Eyes: Negative for pain and visual disturbance.  Respiratory: Negative for cough and shortness of breath.   Cardiovascular: Negative for chest pain and palpitations.  Gastrointestinal: Negative for abdominal pain and vomiting.  Genitourinary: Negative for dysuria and hematuria.  Musculoskeletal: Negative for arthralgias and back pain.  Skin: Positive for wound. Negative for color change and rash.  Neurological: Negative for seizures and syncope.  All other systems reviewed and are negative.    Physical Exam Triage Vital Signs ED Triage Vitals  Enc Vitals Group     BP 02/05/19 1759 (!) 153/98     Pulse Rate 02/05/19 1759 70     Resp 02/05/19 1759 16     Temp 02/05/19 1759 98.2 F (36.8 C)     Temp Source 02/05/19 1759 Temporal     SpO2 02/05/19 1759 98 %     Weight --      Height --      Head Circumference --      Peak Flow --      Pain Score 02/05/19 1758 6     Pain Loc --      Pain Edu? --      Excl. in GC? --    No data found.  Updated Vital Signs BP (!) 153/98 (BP Location: Left Arm)   Pulse 70   Temp 98.2 F (36.8 C) (Temporal)   Resp 16   SpO2 98%   Visual Acuity Right Eye Distance:   Left Eye Distance:   Bilateral Distance:    Right Eye Near:   Left Eye Near:    Bilateral Near:     Physical Exam Vitals signs and nursing note reviewed.  Constitutional:      General: She is not in acute distress.    Appearance: She is well-developed.  HENT:     Head: Normocephalic and atraumatic.  Eyes:     Conjunctiva/sclera: Conjunctivae normal.  Neck:     Musculoskeletal: Neck supple.  Cardiovascular:     Rate and Rhythm: Normal rate and regular rhythm.     Heart sounds: No murmur.  Pulmonary:     Effort: Pulmonary effort is normal. No respiratory distress.  Breath sounds: Normal breath  sounds.  Abdominal:     General: Bowel sounds are normal.     Palpations: Abdomen is soft.     Tenderness: There is no abdominal tenderness. There is no guarding or rebound.  Skin:    General: Skin is warm and dry.     Findings: Lesion present.     Comments: 2 cm area of fluctuant induration under right breast.  Neurological:     Mental Status: She is alert.      UC Treatments / Results  Labs (all labs ordered are listed, but only abnormal results are displayed) Labs Reviewed - No data to display  EKG   Radiology No results found.  Procedures Incision and Drainage  Date/Time: 02/05/2019 6:25 PM Performed by: Mickie Bailate, Maeleigh Buschman H, NP Authorized by: Mickie Bailate, Velma Agnes H, NP   Consent:    Consent obtained:  Verbal   Consent given by:  Patient Location:    Type:  Abscess   Location:  Trunk   Trunk location:  R breast Pre-procedure details:    Skin preparation:  Antiseptic wash Anesthesia (see MAR for exact dosages):    Anesthesia method:  Local infiltration   Local anesthetic:  Lidocaine 2% w/o epi Procedure details:    Needle aspiration: yes     Needle size:  22 G   Incision types:  Single straight   Incision depth:  Dermal   Scalpel blade:  11   Drainage:  Purulent   Drainage amount:  Moderate   Wound treatment:  Wound left open   Packing materials:  None Post-procedure details:    Patient tolerance of procedure:  Tolerated well, no immediate complications   (including critical care time)  Medications Ordered in UC Medications - No data to display  Initial Impression / Assessment and Plan / UC Course  I have reviewed the triage vital signs and the nursing notes.  Pertinent labs & imaging results that were available during my care of the patient were reviewed by me and considered in my medical decision making (see chart for details).     Abscess under right breast.  I&D with moderate purulent drainage.  Treating with doxycycline.  Wound care instruction and signs  of infection discussed with patient.  Instructed her to return here if she notes signs of infection or has other concerns.  Patient agrees to plan of care.     Final Clinical Impressions(s) / UC Diagnoses   Final diagnoses:  Abscess     Discharge Instructions     Take the antibiotic doxycycline as prescribed.    Keep your wound clean and dry.  Wash it gently twice a day with soap and water.     Return here if you see signs of infection, such as increased pain, increased redness, warmth, fever, chills, or other concerning symptoms.         ED Prescriptions    Medication Sig Dispense Auth. Provider   doxycycline (VIBRAMYCIN) 100 MG capsule Take 1 capsule (100 mg total) by mouth 2 (two) times daily. 20 capsule Mickie Bailate, Keagon Glascoe H, NP     PDMP not reviewed this encounter.   Mickie Bailate, Kyllian Clingerman H, NP 02/05/19 [phone]

## 2019-02-05 NOTE — Discharge Instructions (Addendum)
Take the antibiotic doxycycline as prescribed.    Keep your wound clean and dry.  Wash it gently twice a day with soap and water.     Return here if you see signs of infection, such as increased pain, increased redness, warmth, fever, chills, or other concerning symptoms.

## 2019-02-09 NOTE — Progress Notes (Deleted)
Patient ID: Kelli Wells, female   DOB: 11/18/1985, 33 y.o.   MRN: [id number]   After 02/05/2019 for breast abscess.  From ED note: Abscess under right breast.  I&D with moderate purulent drainage.  Treating with doxycycline.  Wound care instruction and signs of infection discussed with patient

## 2019-02-10 ENCOUNTER — Ambulatory Visit: Payer: PRIVATE HEALTH INSURANCE

## 2019-03-15 ENCOUNTER — Inpatient Hospital Stay: Payer: Medicaid Other | Admitting: Family Medicine

## 2019-03-15 ENCOUNTER — Other Ambulatory Visit: Payer: Self-pay

## 2019-03-15 DIAGNOSIS — Z20822 Contact with and (suspected) exposure to covid-19: Secondary | ICD-10-CM

## 2019-03-16 LAB — NOVEL CORONAVIRUS, NAA: SARS-CoV-2, NAA: NOT DETECTED

## 2019-03-22 ENCOUNTER — Other Ambulatory Visit: Payer: Self-pay

## 2019-03-22 DIAGNOSIS — Z20822 Contact with and (suspected) exposure to covid-19: Secondary | ICD-10-CM

## 2019-03-23 LAB — NOVEL CORONAVIRUS, NAA: SARS-CoV-2, NAA: NOT DETECTED

## 2019-03-25 ENCOUNTER — Other Ambulatory Visit: Payer: Self-pay

## 2019-03-25 ENCOUNTER — Ambulatory Visit: Payer: Self-pay | Attending: Family Medicine | Admitting: Physician Assistant

## 2019-03-25 DIAGNOSIS — I10 Essential (primary) hypertension: Secondary | ICD-10-CM

## 2019-03-25 DIAGNOSIS — R202 Paresthesia of skin: Secondary | ICD-10-CM

## 2019-03-25 DIAGNOSIS — S8012XA Contusion of left lower leg, initial encounter: Secondary | ICD-10-CM

## 2019-03-25 DIAGNOSIS — T148XXA Other injury of unspecified body region, initial encounter: Secondary | ICD-10-CM

## 2019-03-25 DIAGNOSIS — E559 Vitamin D deficiency, unspecified: Secondary | ICD-10-CM

## 2019-03-25 NOTE — Progress Notes (Signed)
Virtual Visit via Telephone Note  I connected with [employer] on 03/25/19 at  2:30 PM EST by telephone and verified that I am speaking with the correct person using two identifiers.   I discussed the limitations, risks, security and privacy concerns of performing an evaluation and management service by telephone and the availability of in person appointments. I also discussed with the patient that there may be a patient responsible charge related to this service. The patient expressed understanding and agreed to proceed.  Patient location:  home My Location:  [place] office Persons on the call:  Me and the patient   History of Present Illness:  Patient concerned that she has a circulation issue bc of occasional tingling in her R arm, R leg, and L arm.  No CP, no SOB.  Also a bruise on the inner L lower leg from bumping into something about 10 days ago.  Seems like the bruise is taking a long time to heal. No other bleeding or bruising.  Smokes about 6 cigs a day.    BP running 150/100 when she feels stressed at work.  She hasn't checked when calm   Exposed to Covid by neighbor on Nov 2.  So far testing negative and no symptoms.  No loss of taste or smell.  2 neg tests.      Observations/Objective:  NAD.  A&Ox3   Assessment and Plan: 1. Paresthesia-random and fleeting occurring.   - TSH; Future - Basic metabolic panel; Future  2. Essential hypertension Not controlled based on some readings-advised to check at rest 3-4 times weekly and record and bring to next visit.  Continue current meds.  Smoking cessation advised. We have discussed target BP range and blood pressure goal. I have advised patient to check BP regularly and to call us back or report to clinic if the numbers are consistently higher than 140/90. We discussed the importance of compliance with medical therapy and DASH diet recommended, consequences of uncontrolled hypertension discussed.    - TSH; Future - Basic  metabolic panel; Future  3. Vitamin D deficiency - Vitamin D, 25-hydroxy; Future  4. Bruising - CBC with Differential; Future   If any s/sx of Covid, advise retesting; otherwise will be beyond incubation next week.     Follow Up Instructions:    I discussed the assessment and treatment plan with the patient. The patient was provided an opportunity to ask questions and all were answered. The patient agreed with the plan and demonstrated an understanding of the instructions.   The patient was advised to call back or seek an in-person evaluation if the symptoms worsen or if the condition fails to improve as anticipated.  I provided 15 minutes of non-face-to-face time during this encounter.   Freeman Caldron, PA-C  Patient ID: Kelli Wells, female   DOB: Feb 24, 1986, 33 y.o.   MRN: [id number]

## 2019-03-30 ENCOUNTER — Other Ambulatory Visit: Payer: Self-pay

## 2019-03-30 DIAGNOSIS — Z20822 Contact with and (suspected) exposure to covid-19: Secondary | ICD-10-CM

## 2019-04-01 ENCOUNTER — Other Ambulatory Visit: Payer: Medicaid Other

## 2019-04-04 ENCOUNTER — Telehealth: Payer: Self-pay

## 2019-04-04 NOTE — Telephone Encounter (Signed)
Pt called for covid test results. Pt was tested at [facility] on 03/30/19. Will notify [facility] 04/05/19

## 2019-04-05 LAB — NOVEL CORONAVIRUS, NAA: SARS-CoV-2, NAA: NOT DETECTED

## 2019-04-23 MED FILL — ALBUTEROL SULFATE HFA 108 (: 108 (90 BAS | 25 days supply | Qty: 18 | Fill #1

## 2019-06-21 ENCOUNTER — Other Ambulatory Visit: Payer: Self-pay

## 2019-06-21 ENCOUNTER — Ambulatory Visit: Payer: Self-pay | Attending: Family Medicine | Admitting: Family Medicine

## 2019-06-21 ENCOUNTER — Encounter: Payer: Self-pay | Admitting: Family Medicine

## 2019-06-21 VITALS — BP 129/81 | HR 76 | Ht 64.0 in | Wt 185.6 lb

## 2019-06-21 DIAGNOSIS — F419 Anxiety disorder, unspecified: Secondary | ICD-10-CM

## 2019-06-21 DIAGNOSIS — F329 Major depressive disorder, single episode, unspecified: Secondary | ICD-10-CM

## 2019-06-21 DIAGNOSIS — B9689 Other specified bacterial agents as the cause of diseases classified elsewhere: Secondary | ICD-10-CM

## 2019-06-21 DIAGNOSIS — F32A Depression, unspecified: Secondary | ICD-10-CM

## 2019-06-21 DIAGNOSIS — L0292 Furuncle, unspecified: Secondary | ICD-10-CM

## 2019-06-21 DIAGNOSIS — J328 Other chronic sinusitis: Secondary | ICD-10-CM

## 2019-06-21 DIAGNOSIS — I10 Essential (primary) hypertension: Secondary | ICD-10-CM

## 2019-06-21 DIAGNOSIS — R6889 Other general symptoms and signs: Secondary | ICD-10-CM

## 2019-06-21 DIAGNOSIS — N760 Acute vaginitis: Secondary | ICD-10-CM

## 2019-06-21 MED ORDER — FLUTICASONE PROPIONATE 50 MCG/ACT NA SUSP: 2.0000 | Freq: Every day | NASAL | 1 refills | Status: AC

## 2019-06-21 MED ORDER — CARVEDILOL 25 MG PO TABS: 25.0000 mg | ORAL_TABLET | Freq: Two times a day (BID) | ORAL | 6 refills | Status: DC

## 2019-06-21 MED ORDER — CHLORHEXIDINE GLUCONATE 4 % EX LIQD: Freq: Every day | CUTANEOUS | 1 refills | Status: DC | PRN

## 2019-06-21 MED ORDER — METRONIDAZOLE 500 MG PO TABS: 500.0000 mg | ORAL_TABLET | Freq: Two times a day (BID) | ORAL | 0 refills | Status: DC

## 2019-06-21 MED ORDER — AMLODIPINE BESYLATE 10 MG PO TABS
10.0000 mg | ORAL_TABLET | Freq: Every day | ORAL | 6 refills | Status: DC
Start: 2019-06-21 — End: 2019-11-19

## 2019-06-21 MED ORDER — SERTRALINE HCL 100 MG PO TABS: 100.0000 mg | ORAL_TABLET | Freq: Every day | ORAL | 6 refills | Status: DC

## 2019-06-21 MED FILL — CARVEDILOL 25 MG TABLET: 25 | 30 days supply | Qty: 60 | Fill #0

## 2019-06-21 MED FILL — AMLODIPINE BESYLATE 10 MG T: 10 | 30 days supply | Qty: 30 | Fill #0

## 2019-06-21 MED FILL — SERTRALINE HCL 100 MG TAB: 100 | 30 days supply | Qty: 30 | Fill #0

## 2019-06-21 MED FILL — FLUTICASONE PROP 50 MCG SPR: 50 | 30 days supply | Qty: 16 | Fill #0

## 2019-06-21 MED FILL — ALBUTEROL SULFATE HFA 108 (: 108 (90 BAS | 25 days supply | Qty: 18 | Fill #1

## 2019-06-21 NOTE — Patient Instructions (Signed)
Once I review your test results, I will be in touch with you via 'my chart' or a phone call from my office (if you are not signed up for my chart).  

## 2019-06-21 NOTE — Progress Notes (Signed)
Subjective:  Patient ID: Kelli Wells, female    DOB: [birth year]  Age: 34 y.o. MRN: [id number]  CC: Hypertension   HPI Kelli Wells is a 34 year old female with a history of hypertension, anxiety and depression who presents today for follow-up visit.  She has been wheezing for the last 2 weeks and Tussin has been helping with bringing up phlegm. Not doing flonase or Zyrtec but uses OTC sinus pills.  She has no underlying history of asthma and has not had chest pain or dyspnea.  Also denies sinus pressure. Has also had cold intolerance ever since the weather got cold. She has a R inframammary cyst which has been drained in the past.  Lesions have occurred in the past, mainly in the areas where she sweats.  Denies presence of drainage at this time and denies fever.  She has had trouble focusing at school and dropped 3 of her classes. States the school needs a sort of form completed by myself but she does not have the form with her. Currently on Zoloft for anxiety and depression. Doing well on her antihypertensive.  At the conclusion of the visit after I had exited the room she asked the nurse for a prescription for Flagyl as she thought she had symptoms of bacterial vaginosis which she has had in the past. Past Medical History:  Diagnosis Date  . Anxiety   . Asthma   . Depression   . GERD (gastroesophageal reflux disease)   . Hypertension   . Infection    UTI  . Pregnancy induced hypertension   . Vaginal Pap smear, abnormal    LEEP, normal since    Past Surgical History:  Procedure Laterality Date  . CHOLECYSTECTOMY N/A 12/18/2014   Procedure: LAPAROSCOPIC CHOLECYSTECTOMY WITH INTRAOPERATIVE CHOLANGIOGRAM;  Surgeon: Autumn Messing III, MD;  Location: [facility];  Service: General;  Laterality: N/A;  . LEEP    . LYMPH GLAND EXCISION      Family History  Problem Relation Age of Onset  . Hypertension Maternal Grandmother   . Cancer Maternal Grandmother        breast  .  Hypertension Maternal Grandfather   . Kidney disease Paternal Grandmother   . Heart disease Paternal Grandfather   . Hypertension Mother   . Cancer Mother        rectal  . Hypertension Father   . Hearing loss Neg Hx     Allergies  Allergen Reactions  . Sulfa Antibiotics Anaphylaxis and Swelling    Outpatient Medications Prior to Visit  Medication Sig Dispense Refill  . albuterol (VENTOLIN HFA) 108 (90 Base) MCG/ACT inhaler Inhale 2 puffs into the lungs every 6 (six) hours as needed for wheezing or shortness of breath. 1 Inhaler 2  . amLODipine (NORVASC) 10 MG tablet Take 1 tablet (10 mg total) by mouth daily. 30 tablet 6  . carvedilol (COREG) 25 MG tablet Take 1 tablet (25 mg total) by mouth 2 (two) times daily with a meal. 60 tablet 6  . fluticasone (FLONASE) 50 MCG/ACT nasal spray Place 2 sprays into both nostrils daily. 16 g 1  . loratadine (CLARITIN) 10 MG tablet Take 1 tablet (10 mg total) by mouth daily. 30 tablet 1  . methocarbamol (ROBAXIN) 500 MG tablet Take 1 tablet (500 mg total) by mouth every 8 (eight) hours as needed for muscle spasms. 90 tablet 1  . naproxen (NAPROSYN) 500 MG tablet Take 1 tablet (500 mg total) by mouth 2 (two) times daily with a  meal. 60 tablet 6  . sertraline (ZOLOFT) 100 MG tablet Take 1 tablet (100 mg total) by mouth daily. 30 tablet 6  . docusate sodium (COLACE) 100 MG capsule Take 1 capsule (100 mg total) by mouth daily as needed for mild constipation or moderate constipation. (Patient not taking: Reported on 03/18/2018) 30 capsule 0  . doxycycline (VIBRAMYCIN) 100 MG capsule Take 1 capsule (100 mg total) by mouth 2 (two) times daily. (Patient not taking: Reported on 06/21/2019) 20 capsule 0   No facility-administered medications prior to visit.     ROS Review of Systems  Constitutional: Negative for activity change, appetite change and fatigue.  HENT: Negative for congestion, sinus pressure and sore throat.   Eyes: Negative for visual  disturbance.  Respiratory: Negative for cough, chest tightness, shortness of breath and wheezing.   Cardiovascular: Negative for chest pain and palpitations.  Gastrointestinal: Negative for abdominal distention, abdominal pain and constipation.  Endocrine: Negative for polydipsia.  Genitourinary: Negative for dysuria and frequency.  Musculoskeletal: Negative for arthralgias and back pain.  Skin: Positive for rash.  Neurological: Negative for tremors, light-headedness and numbness.  Hematological: Does not bruise/bleed easily.  Psychiatric/Behavioral: Negative for agitation and behavioral problems.    Objective:  BP 129/81   Pulse 76   Ht 5' 4" (1.626 m)   Wt 185 lb 9.6 oz (84.2 kg)   SpO2 97%   BMI 31.86 kg/m   BP/Weight 06/21/2019 02/05/2019 0/26/3785  Systolic BP 885 027 741  Diastolic BP 81 98 83  Wt. (Lbs) 185.6 - 185  BMI 31.86 - 31.76      Physical Exam Constitutional:      Appearance: She is well-developed.  Neck:     Vascular: No JVD.  Cardiovascular:     Rate and Rhythm: Normal rate.     Heart sounds: Normal heart sounds. No murmur.  Pulmonary:     Effort: Pulmonary effort is normal.     Breath sounds: Normal breath sounds. No wheezing or rales.  Chest:     Chest wall: No tenderness.  Abdominal:     General: Bowel sounds are normal. There is no distension.     Palpations: Abdomen is soft. There is no mass.     Tenderness: There is no abdominal tenderness.  Musculoskeletal:        General: Normal range of motion.     Right lower leg: No edema.     Left lower leg: No edema.  Skin:    Comments: Right inframammary furuncle with no evidence of inflammation, no drainage, no tenderness Left inframammary healed furuncle  Neurological:     Mental Status: She is alert and oriented to person, place, and time.  Psychiatric:        Mood and Affect: Mood normal.     CMP Latest Ref Rng & Units 06/22/2018 09/01/2017 09/12/2016  Glucose 65 - 99 mg/dL 84 76 82  BUN 6 -  20 mg/dL _0 Creatinine 0.57 - 1.00 mg/dL 0.68 0.77 0.79  Sodium 134 - 144 mmol/L 137 139 137  Potassium 3.5 - 5.2 mmol/L 4.0 3.6 4.4  Chloride 96 - 106 mmol/L 104 108 102  CO2 20 - 29 mmol/L _1 Calcium 8.7 - 10.2 mg/dL 9.2 9.3 9.9  Total Protein 6.0 - 8.5 g/dL - - 6.9  Total Bilirubin 0.0 - 1.2 mg/dL - - 0.3  Alkaline Phos 39 - 117 IU/L - - 94  AST 0 - 40 IU/L - -  14  ALT 0 - 32 IU/L - - 12    Lipid Panel     Component Value Date/Time   CHOL 191 09/26/2017 0906   TRIG 47 09/26/2017 0906   HDL 61 09/26/2017 0906   CHOLHDL 3.1 09/26/2017 0906   CHOLHDL 3.4 01/01/2016 1020   VLDL 10 01/01/2016 1020   LDLCALC 121 (H) 09/26/2017 0906    CBC    Component Value Date/Time   WBC 6.9 09/01/2017 1722   RBC 4.54 09/01/2017 1722   HGB 13.2 09/01/2017 1722   HGB 13.1 11/07/2016 1649   HCT 39.3 09/01/2017 1722   HCT 39.1 11/07/2016 1649   PLT 295 09/01/2017 1722   PLT 355 11/07/2016 1649   MCV 86.6 09/01/2017 1722   MCV 87 11/07/2016 1649   MCV 88 03/22/2013 0320   MCH 29.1 09/01/2017 1722   MCHC 33.6 09/01/2017 1722   RDW 13.3 09/01/2017 1722   RDW 13.7 11/07/2016 1649   RDW 12.8 03/22/2013 0320   LYMPHSABS 2.8 11/07/2016 1649   EOSABS 0.2 11/07/2016 1649   BASOSABS 0.0 11/07/2016 1649    No results found for: HGBA1C  Assessment & Plan:   1. Essential hypertension Controlled Counseled on blood pressure goal of less than 130/80, low-sodium, DASH diet, medication compliance, 150 minutes of moderate intensity exercise per week. Discussed medication compliance, adverse effects. - carvedilol (COREG) 25 MG tablet; Take 1 tablet (25 mg total) by mouth 2 (two) times daily with a meal.  Dispense: 60 tablet; Refill: 6 - amLODipine (NORVASC) 10 MG tablet; Take 1 tablet (10 mg total) by mouth daily.  Dispense: 30 tablet; Refill: 6 - CMP14+EGFR  2. Other chronic sinusitis Uncontrolled This could explain her wheezing and mucus production Neck she has been out of  Flonase which I have refilled Advised to use antihistamines She requested her albuterol MDI from the pharmacy - fluticasone (FLONASE) 50 MCG/ACT nasal spray; Place 2 sprays into both nostrils daily.  Dispense: 16 g; Refill: 1  3. Furuncle No indication for antibiotics at this time Advised to use hot compress Symptoms suspicious for hidradenitis however she has not had several lesions in the body parts Trial of Hibiclens - chlorhexidine (HIBICLENS) 4 % external liquid; Apply topically daily as needed.  Dispense: 120 mL; Refill: 1  4. Anxiety and depression Stable She will follow up with her school regarding obtaining the form for my completion - sertraline (ZOLOFT) 100 MG tablet; Take 1 tablet (100 mg total) by mouth daily.  Dispense: 30 tablet; Refill: 6  5. Cold intolerance We will evaluate for thyroid disorder - T4, free - CBC with Differential/Platelet - TSH  6. Bacterial vaginitis - metroNIDAZOLE (FLAGYL) 500 MG tablet; Take 1 tablet (500 mg total) by mouth 2 (two) times daily.  Dispense: 14 tablet; Refill: 0     Charlott Rakes, MD, FAAFP. [facility], [place]   06/21/2019, 4:31 PM

## 2019-06-21 NOTE — Progress Notes (Signed)
Cyst under breast. Always cold. Wheezing.

## 2019-06-22 LAB — CMP14+EGFR
ALT: 11 IU/L (ref 0–32)
AST: 9 IU/L (ref 0–40)
Albumin/Globulin Ratio: 1.7 (ref 1.2–2.2)
Albumin: 4.3 g/dL (ref 3.8–4.8)
Alkaline Phosphatase: 104 IU/L (ref 39–117)
BUN/Creatinine Ratio: 11 (ref 9–23)
BUN: 7 mg/dL (ref 6–20)
Bilirubin Total: 0.2 mg/dL (ref 0.0–1.2)
CO2: 20 mmol/L (ref 20–29)
Calcium: 9.5 mg/dL (ref 8.7–10.2)
Chloride: 102 mmol/L (ref 96–106)
Creatinine, Ser: 0.65 mg/dL (ref 0.57–1.00)
GFR calc Af Amer: 133 mL/min/{1.73_m2} (ref >59)
GFR calc non Af Amer: 115 mL/min/{1.73_m2} (ref >59)
Globulin, Total: 2.5 g/dL (ref 1.5–4.5)
Glucose: 76 mg/dL (ref 65–99)
Potassium: 4.3 mmol/L (ref 3.5–5.2)
Sodium: 135 mmol/L (ref 134–144)
Total Protein: 6.8 g/dL (ref 6.0–8.5)

## 2019-06-22 LAB — CBC WITH DIFFERENTIAL/PLATELET
Basophils Absolute: 0 10*3/uL (ref 0.0–0.2)
Basos: 0 %
EOS (ABSOLUTE): 0.6 10*3/uL — ABNORMAL HIGH (ref 0.0–0.4)
Eos: 7 %
Hematocrit: 47.4 % — ABNORMAL HIGH (ref 34.0–46.6)
Hemoglobin: 15.7 g/dL (ref 11.1–15.9)
Immature Grans (Abs): 0 10*3/uL (ref 0.0–0.1)
Immature Granulocytes: 0 %
Lymphocytes Absolute: 4 10*3/uL — ABNORMAL HIGH (ref 0.7–3.1)
Lymphs: 50 %
MCH: 30.1 pg (ref 26.6–33.0)
MCHC: 33.1 g/dL (ref 31.5–35.7)
MCV: 91 fL (ref 79–97)
Monocytes Absolute: 0.8 10*3/uL (ref 0.1–0.9)
Monocytes: 10 %
Neutrophils Absolute: 2.6 10*3/uL (ref 1.4–7.0)
Neutrophils: 33 %
Platelets: 393 10*3/uL (ref 150–450)
RBC: 5.22 x10E6/uL (ref 3.77–5.28)
RDW: 12.9 % (ref 11.7–15.4)
WBC: 8.1 10*3/uL (ref 3.4–10.8)

## 2019-06-22 LAB — T4, FREE: Free T4: 1.1 ng/dL (ref 0.82–1.77)

## 2019-06-22 LAB — TSH: TSH: 1.01 u[IU]/mL (ref 0.450–4.500)

## 2019-06-22 MED FILL — metroNIDAZOLE 500 MG TABS: 500 | 7 days supply | Qty: 14 | Fill #0

## 2019-07-06 ENCOUNTER — Other Ambulatory Visit: Payer: Self-pay | Admitting: Family Medicine

## 2019-07-06 DIAGNOSIS — F329 Major depressive disorder, single episode, unspecified: Secondary | ICD-10-CM

## 2019-07-06 DIAGNOSIS — F419 Anxiety disorder, unspecified: Secondary | ICD-10-CM

## 2019-08-09 MED FILL — SERTRALINE HCL 100 MG TAB: 100 | 30 days supply | Qty: 30 | Fill #1

## 2019-08-25 MED FILL — AMLODIPINE BESYLATE 10 MG T: 10 | 30 days supply | Qty: 30 | Fill #1

## 2019-09-08 ENCOUNTER — Encounter (HOSPITAL_COMMUNITY): Payer: Self-pay | Admitting: *Deleted

## 2019-09-08 ENCOUNTER — Emergency Department (HOSPITAL_COMMUNITY)
Admission: EM | Admit: 2019-09-08 | Discharge: 2019-09-08 | Disposition: A | Discharge status: Home / Self Care | Payer: Medicaid Other | Attending: Emergency Medicine | Admitting: Emergency Medicine

## 2019-09-08 ENCOUNTER — Other Ambulatory Visit: Payer: Self-pay

## 2019-09-08 DIAGNOSIS — T783XXA Angioneurotic edema, initial encounter: Secondary | ICD-10-CM | POA: Insufficient documentation

## 2019-09-08 DIAGNOSIS — R220 Localized swelling, mass and lump, head: Secondary | ICD-10-CM | POA: Diagnosis present

## 2019-09-08 DIAGNOSIS — J45909 Unspecified asthma, uncomplicated: Secondary | ICD-10-CM | POA: Diagnosis not present

## 2019-09-08 DIAGNOSIS — F17210 Nicotine dependence, cigarettes, uncomplicated: Secondary | ICD-10-CM | POA: Insufficient documentation

## 2019-09-08 DIAGNOSIS — Z79899 Other long term (current) drug therapy: Secondary | ICD-10-CM | POA: Insufficient documentation

## 2019-09-08 DIAGNOSIS — I10 Essential (primary) hypertension: Secondary | ICD-10-CM | POA: Insufficient documentation

## 2019-09-08 MED ORDER — EPINEPHRINE 0.3 MG/0.3ML IJ SOAJ: 0.3000 mg | INTRAMUSCULAR | 1 refills | Status: AC | PRN

## 2019-09-08 MED ORDER — EPINEPHRINE 0.3 MG/0.3ML IJ SOAJ
0.3000 mg | Freq: Once | INTRAMUSCULAR | Status: AC
  Administered 2019-09-08: 0.3 mg via INTRAMUSCULAR
  Filled 2019-09-08: qty 0.3

## 2019-09-08 MED ORDER — DEXAMETHASONE 4 MG PO TABS
10.0000 mg | ORAL_TABLET | Freq: Once | ORAL | Status: AC
  Administered 2019-09-08: 05:00:00 10 mg via ORAL
  Filled 2019-09-08: qty 2

## 2019-09-08 NOTE — ED Provider Notes (Signed)
  Physical Exam  BP 114/90   Pulse 84   Temp 98.3 F (36.8 C) (Oral)   Resp 19   SpO2 100%   Physical Exam  ED Course/Procedures     Procedures  MDM   Assuming care of patient from Dr. Bebe Shaggy   Patient in the ED for angioedema, lip swelling  Concerning findings are as following: New swelling to the lip that has gotten worse.  Patient has received epi shot.  According to Dr. Bebe Shaggy, patient is now stable.  The swelling is not getting worse.  She has not had any respiratory concerns, drooling.  She needs to be monitored for 4 hours post epi and can be discharged close to 9 AM.   I reassessed the patient around 8:30 AM. She denies worsening of her symptoms.  She clearly has swelling to her lips, no swelling in her oral mucosa within the mouth including tongue or uvula. Patient has no drooling, stridulous breath sounds, hoarseness in her voice.  She does not have insurance.  We will give her EpiPen to take home. Strict ER return precautions discussed.  We are unaware as to what caused the underlying process.  Chrystle Barlowe was evaluated in Emergency Department on 09/08/2019 for the symptoms described in the history of present illness. She was evaluated in the context of the global COVID-19 pandemic, which necessitated consideration that the patient might be at risk for infection with the SARS-CoV-2 virus that causes COVID-19. Institutional protocols and algorithms that pertain to the evaluation of patients at risk for COVID-19 are in a state of rapid change based on information released by regulatory bodies including the CDC and federal and state organizations. These policies and algorithms were followed during the patient's care in the ED.       Derwood Kaplan, MD 09/08/19 [phone]

## 2019-09-08 NOTE — ED Notes (Signed)
Increased swelling noted to lips and now having swelling to the left eye, no changes in respiratory status. EPI administered, placed on cardiac monitor, aware to notify staff of any changes.

## 2019-09-08 NOTE — Discharge Instructions (Addendum)
We saw you in the ER after you had the allergic reaction.  The reaction is severe, however, it appears to be in control and there is no increased swelling or any difficulty in breathing noted. We are not sure what caused the reaction, and it is important for you to follow up with an allergist. Please take the medications prescribed.  If your symptoms get worse (swelling/trouble breathing/throat closing etc.) - give yourself the epipen shot immediately and then call 911.

## 2019-09-08 NOTE — ED Notes (Signed)
Top lip remains swollen, no airway involvement, pt does feel like it has helped decrease swelling with meds.

## 2019-09-08 NOTE — ED Triage Notes (Signed)
Pt says two days ago she had swelling of the right side of her lips, took benadryl and it subsided. This morning she woke up with swelling on the right side of her lips, says "it is worse this time". Got 1st Moderna shot on Tuesday afternoon. Denies difficulty breathing, swallowing or rashes. Took Benadryl 2 caps about 15 minutes ago.

## 2019-09-08 NOTE — ED Provider Notes (Signed)
[facility] DEPT Provider Note   CSN: [id number] Arrival date & time: 09/08/19  0414     History Chief Complaint  Patient presents with  . Allergic Reaction    Kelli Wells is a 34 y.o. female.  The history is provided by the patient.  Allergic Reaction Presenting symptoms: swelling   Presenting symptoms: no difficulty breathing, no difficulty swallowing, no itching, no rash and no wheezing   Severity:  Moderate Relieved by:  Antihistamines Worsened by:  Nothing Patient with anxiety, asthma, depression, hypertension presents with upper lip swelling.  Patient reports approximately 2 days ago she noted right-sided lip swelling and took Benadryl and it improved.  Tonight she woke up and noted that the same side of her lips was worsening over the past several hours.  No fevers or vomiting.  No tongue swelling.  No difficulty swallowing.  No shortness of breath, no vomiting, no syncope.  No itching or rash.  Since her last episode of swelling, she did receive the first dose of the COVID-19 moderna vaccine     Past Medical History:  Diagnosis Date  . Anxiety   . Asthma   . Depression   . GERD (gastroesophageal reflux disease)   . Hypertension   . Infection    UTI  . Pregnancy induced hypertension   . Vaginal Pap smear, abnormal    LEEP, normal since    Patient Active Problem List   Diagnosis Date Noted  . Hypertension 12/14/2015  . Anxiety and depression 12/14/2015  . Cholecystitis with cholelithiasis 12/17/2014  . Pregnancy 05/22/2014  . Postpartum state 05/12/2014  . Chronic hypertension in pregnancy   . [redacted] weeks gestation of pregnancy   . Non-reactive NST (non-stress test)   . [redacted] weeks gestation of pregnancy     Past Surgical History:  Procedure Laterality Date  . CHOLECYSTECTOMY N/A 12/18/2014   Procedure: LAPAROSCOPIC CHOLECYSTECTOMY WITH INTRAOPERATIVE CHOLANGIOGRAM;  Surgeon: Autumn Messing III, MD;  Location: [facility];  Service:  General;  Laterality: N/A;  . LEEP    . LYMPH GLAND EXCISION       OB History    Gravida  2   Para  2   Term  2   Preterm  0   AB  0   Living  2     SAB  0   TAB  0   Ectopic  0   Multiple  0   Live Births  2           Family History  Problem Relation Age of Onset  . Hypertension Maternal Grandmother   . Cancer Maternal Grandmother        breast  . Hypertension Maternal Grandfather   . Kidney disease Paternal Grandmother   . Heart disease Paternal Grandfather   . Hypertension Mother   . Cancer Mother        rectal  . Hypertension Father   . Hearing loss Neg Hx     Social History   Tobacco Use  . Smoking status: Current Every Day Smoker    Packs/day: 0.25    Types: Cigarettes  . Smokeless tobacco: Never Used  . Tobacco comment: quit June 2015  Substance Use Topics  . Alcohol use: Yes    Alcohol/week: 2.0 - 3.0 standard drinks    Types: 2 - 3 Glasses of wine per week    Comment: occas  . Drug use: No    Home Medications Prior to Admission medications  Medication Sig Start Date End Date Taking? Authorizing Provider  albuterol (VENTOLIN HFA) 108 (90 Base) MCG/ACT inhaler Inhale 2 puffs into the lungs every 6 (six) hours as needed for wheezing or shortness of breath. 09/29/18   Hoy Register, MD  amLODipine (NORVASC) 10 MG tablet Take 1 tablet (10 mg total) by mouth daily. 06/21/19   Hoy Register, MD  carvedilol (COREG) 25 MG tablet Take 1 tablet (25 mg total) by mouth 2 (two) times daily with a meal. 06/21/19   Hoy Register, MD  chlorhexidine (HIBICLENS) 4 % external liquid Apply topically daily as needed. 06/21/19   Hoy Register, MD  EPINEPHrine (EPIPEN 2-PAK) 0.3 mg/0.3 mL IJ SOAJ injection Inject 0.3 mLs (0.3 mg total) into the muscle as needed for anaphylaxis. 09/08/19   Zadie Rhine, MD  fluticasone (FLONASE) 50 MCG/ACT nasal spray Place 2 sprays into both nostrils daily. 06/21/19   Hoy Register, MD  loratadine (CLARITIN) 10 MG  tablet Take 1 tablet (10 mg total) by mouth daily. 10/20/18   Hoy Register, MD  methocarbamol (ROBAXIN) 500 MG tablet Take 1 tablet (500 mg total) by mouth every 8 (eight) hours as needed for muscle spasms. 09/29/18   Hoy Register, MD  metroNIDAZOLE (FLAGYL) 500 MG tablet Take 1 tablet (500 mg total) by mouth 2 (two) times daily. 06/21/19   Hoy Register, MD  naproxen (NAPROSYN) 500 MG tablet Take 1 tablet (500 mg total) by mouth 2 (two) times daily with a meal. 09/29/18   Hoy Register, MD  sertraline (ZOLOFT) 100 MG tablet Take 1 tablet (100 mg total) by mouth daily. 06/21/19   Hoy Register, MD    Allergies    Sulfa antibiotics  Review of Systems   Review of Systems  Constitutional: Negative for fever.  HENT: Positive for facial swelling. Negative for dental problem and trouble swallowing.   Respiratory: Negative for wheezing.   Gastrointestinal: Negative for vomiting.  Skin: Negative for itching and rash.  Neurological: Negative for syncope.  All other systems reviewed and are negative.   Physical Exam Updated Vital Signs BP (!) 150/87   Pulse 80   Temp 98.3 F (36.8 C) (Oral)   Resp 16   SpO2 98%   Physical Exam CONSTITUTIONAL: Well developed/well nourished HEAD: Normocephalic/atraumatic EYES: EOMI/PERRL ENMT: Mucous membranes moist, swelling noted to the right upper lip.  No erythema, no abscess, no tenderness.  There is no dental tenderness or dental abscess.  No tongue swelling.  Uvula midline without erythema or edema.  No stridor, no drooling NECK: supple no meningeal signs CV: S1/S2 noted, no murmurs/rubs/gallops noted LUNGS: Lungs are clear to auscultation bilaterally, no apparent distress ABDOMEN: soft, nontender NEURO: Pt is awake/alert/appropriate, moves all extremitiesx4.  No facial droop.  EXTREMITIES: pulses normal/equal, full ROM SKIN: warm, color normal PSYCH: no abnormalities of mood noted, alert and oriented to situation  ED Results / Procedures /  Treatments   Labs (all labs ordered are listed, but only abnormal results are displayed) Labs Reviewed - No data to display  EKG None  Radiology No results found.  Procedures Procedures  Medications Ordered in ED Medications  EPINEPHrine (EPI-PEN) injection 0.3 mg (0.3 mg Intramuscular Given 09/08/19 0510)  dexamethasone (DECADRON) tablet 10 mg (10 mg Oral Given 09/08/19 1638)    ED Course  I have reviewed the triage vital signs and the nursing notes.      MDM Rules/Calculators/A&P  5:50 AM Patient with angioedema of unclear etiology.  She actually had this for the first time prior to receiving her Covid vaccine.  Tonight it returned and appeared to be worsening.  She is not on an ACE inhibitor.  No other signs of allergic reaction, she denies any itching or rash.  No other new exposures. She is already taken Benadryl.  We will give a dose of steroids and an EpiPen and reassess. 7:13 AM Patient with no worsening of lip angioedema, but does have mild swelling of left eyelid.  No other acute issues.  We will continue to monitor. Monitor till 9am Signed out to dr Rhunette Croft at shift change If no worsening she can be discharged  Final Clinical Impression(s) / ED Diagnoses Final diagnoses:  Angioedema, initial encounter    Rx / DC Orders ED Discharge Orders         Ordered    EPINEPHrine (EPIPEN 2-PAK) 0.3 mg/0.3 mL IJ SOAJ injection  As needed     09/08/19 0548           Zadie Rhine, MD 09/08/19 [phone]

## 2019-09-22 ENCOUNTER — Encounter (HOSPITAL_COMMUNITY): Payer: Self-pay

## 2019-09-22 ENCOUNTER — Ambulatory Visit (HOSPITAL_COMMUNITY)
Admission: EM | Admit: 2019-09-22 | Discharge: 2019-09-22 | Disposition: A | Discharge status: Home / Self Care | Payer: Medicaid Other | Attending: Family Medicine | Admitting: Family Medicine

## 2019-09-22 ENCOUNTER — Ambulatory Visit (INDEPENDENT_AMBULATORY_CARE_PROVIDER_SITE_OTHER): Payer: Medicaid Other

## 2019-09-22 ENCOUNTER — Other Ambulatory Visit: Payer: Self-pay

## 2019-09-22 DIAGNOSIS — Z8249 Family history of ischemic heart disease and other diseases of the circulatory system: Secondary | ICD-10-CM | POA: Insufficient documentation

## 2019-09-22 DIAGNOSIS — J45909 Unspecified asthma, uncomplicated: Secondary | ICD-10-CM | POA: Insufficient documentation

## 2019-09-22 DIAGNOSIS — K219 Gastro-esophageal reflux disease without esophagitis: Secondary | ICD-10-CM | POA: Diagnosis not present

## 2019-09-22 DIAGNOSIS — Z8709 Personal history of other diseases of the respiratory system: Secondary | ICD-10-CM | POA: Diagnosis not present

## 2019-09-22 DIAGNOSIS — Z79899 Other long term (current) drug therapy: Secondary | ICD-10-CM | POA: Diagnosis not present

## 2019-09-22 DIAGNOSIS — I10 Essential (primary) hypertension: Secondary | ICD-10-CM

## 2019-09-22 DIAGNOSIS — X58XXXA Exposure to other specified factors, initial encounter: Secondary | ICD-10-CM | POA: Diagnosis not present

## 2019-09-22 DIAGNOSIS — Z79891 Long term (current) use of opiate analgesic: Secondary | ICD-10-CM | POA: Insufficient documentation

## 2019-09-22 DIAGNOSIS — R0981 Nasal congestion: Secondary | ICD-10-CM | POA: Diagnosis not present

## 2019-09-22 DIAGNOSIS — Z7951 Long term (current) use of inhaled steroids: Secondary | ICD-10-CM | POA: Insufficient documentation

## 2019-09-22 DIAGNOSIS — F17210 Nicotine dependence, cigarettes, uncomplicated: Secondary | ICD-10-CM | POA: Diagnosis not present

## 2019-09-22 DIAGNOSIS — Z882 Allergy status to sulfonamides status: Secondary | ICD-10-CM | POA: Diagnosis not present

## 2019-09-22 DIAGNOSIS — R6883 Chills (without fever): Secondary | ICD-10-CM | POA: Diagnosis present

## 2019-09-22 DIAGNOSIS — S93491A Sprain of other ligament of right ankle, initial encounter: Secondary | ICD-10-CM | POA: Insufficient documentation

## 2019-09-22 DIAGNOSIS — Z20822 Contact with and (suspected) exposure to covid-19: Secondary | ICD-10-CM | POA: Diagnosis not present

## 2019-09-22 DIAGNOSIS — M25571 Pain in right ankle and joints of right foot: Secondary | ICD-10-CM

## 2019-09-22 DIAGNOSIS — R05 Cough: Secondary | ICD-10-CM | POA: Insufficient documentation

## 2019-09-22 DIAGNOSIS — R059 Cough, unspecified: Secondary | ICD-10-CM

## 2019-09-22 MED ORDER — ALBUTEROL SULFATE HFA 108 (90 BASE) MCG/ACT IN AERS: 2.0000 | INHALATION_SPRAY | Freq: Four times a day (QID) | RESPIRATORY_TRACT | 0 refills | Status: DC | PRN

## 2019-09-22 MED ORDER — HYDROCODONE-HOMATROPINE 5-1.5 MG/5ML PO SYRP: 5.0000 mL | ORAL_SOLUTION | Freq: Four times a day (QID) | ORAL | 0 refills | Status: DC | PRN

## 2019-09-22 MED ORDER — PREDNISONE 10 MG (21) PO TBPK: ORAL_TABLET | Freq: Every day | ORAL | 0 refills | Status: DC

## 2019-09-22 NOTE — Discharge Instructions (Addendum)
Be aware, your cough medication may cause drowsiness. Please do not drive, operate heavy machinery or make important decisions while on this medication, it can cloud your judgement.  Your blood pressure was noted to be elevated during your visit today. If you are currently taking medication for high blood pressure, please ensure you are taking this as directed. If you do not have a history of high blood pressure and your blood pressure remains persistently elevated, you may need to begin taking a medication at some point. You may return here within the next few days to recheck if unable to see your primary care provider or if do not have a one.  BP (!) 155/99 (BP Location: Right Arm)   Pulse 66   Temp 98.2 F (36.8 C) (Oral)   Resp 18   Ht 5\' 2"  (1.575 m)   Wt 84.4 kg   SpO2 100%   Breastfeeding No   BMI 34.02 kg/m

## 2019-09-22 NOTE — ED Triage Notes (Signed)
Patient complains of cough, chills, congestion, hoarseness, body aches x 2-3 days.  Patient also complains of right ankle pain that started 2 weeks ago after she turned her ankle over while on some steps. Patient states that pain has continued to linger.

## 2019-09-23 LAB — SARS CORONAVIRUS 2 (TAT 6-24 HRS): SARS Coronavirus 2: NEGATIVE

## 2019-09-23 NOTE — ED Provider Notes (Signed)
[place]   [id number] 09/22/19 Arrival Time: 1884  ASSESSMENT & PLAN:  1. Chills   2. Cough   3. Nasal congestion   4. Acute right ankle pain   5. Sprain of anterior talofibular ligament of right ankle, initial encounter   6. History of asthma   7. Elevated blood pressure reading in office with diagnosis of hypertension     I have personally viewed the imaging studies ordered this visit. No ankle fracture appreciated.  COVID testing sent. No indication for chest imaging at this time. Discussed viral illnesses.   Meds ordered this encounter  Medications  . albuterol (VENTOLIN HFA) 108 (90 Base) MCG/ACT inhaler    Sig: Inhale 2 puffs into the lungs every 6 (six) hours as needed for wheezing or shortness of breath.    Dispense:  18 g    Refill:  0  . predniSONE (STERAPRED UNI-PAK 21 TAB) 10 MG (21) TBPK tablet    Sig: Take by mouth daily. Take as directed.    Dispense:  21 tablet    Refill:  0  . HYDROcodone-homatropine (HYCODAN) 5-1.5 MG/5ML syrup    Sig: Take 5 mLs by mouth every 6 (six) hours as needed for cough.    Dispense:  90 mL    Refill:  0    Orders Placed This Encounter  Procedures  . SARS CORONAVIRUS 2 (TAT 6-24 HRS) Nasopharyngeal Nasopharyngeal Swab  . DG Ankle Complete Right  . Apply ASO lace-up ankle brace    Recommend: Follow-up Information    [facility].   Why: If your ankle is not improving over the next week. Contact information: [place] [phone]            Discharge Instructions     Be aware, your cough medication may cause drowsiness. Please do not drive, operate heavy machinery or make important decisions while on this medication, it can cloud your judgement.  Your blood pressure was noted to be elevated during your visit today. If you are currently taking medication for high blood pressure, please ensure you are taking this as directed.  If you do not have a history of high blood pressure and your blood pressure remains persistently elevated, you may need to begin taking a medication at some point. You may return here within the next few days to recheck if unable to see your primary care provider or if do not have a one.  BP (!) 155/99 (BP Location: Right Arm)   Pulse 66   Temp 98.2 F (36.8 C) (Oral)   Resp 18   Ht 5\' 2"  (1.575 m)   Wt 84.4 kg   SpO2 100%   Breastfeeding No   BMI 34.02 kg/m       [place] Controlled Substances Registry consulted for this patient. I feel the risk/benefit ratio today is favorable for proceeding with this prescription for a controlled substance. Medication sedation precautions given.  Reviewed expectations re: course of current medical issues. Questions answered. Outlined signs and symptoms indicating need for more acute intervention. Patient verbalized understanding. After Visit Summary given.  SUBJECTIVE: History from: patient. Kelli Wells is a 34 y.o. female who reports fairly persistent mild to moderate pain of her right lateral ankle; described as aching; without radiation. Onset: gradual. First noted: 2 weeks ago after "turning" her ankle on steps; has been able to bear weight. Symptoms have progressed to a point and plateaued since beginning.  Aggravating factors: prolonged walking/standing. Alleviating factors: rest. Associated symptoms: none reported. Extremity sensation changes or weakness: none. Self treatment: Tylenol without much relief.  History of similar: no.  Past Surgical History:  Procedure Laterality Date  . CHOLECYSTECTOMY N/A 12/18/2014   Procedure: LAPAROSCOPIC CHOLECYSTECTOMY WITH INTRAOPERATIVE CHOLANGIOGRAM;  Surgeon: Chevis Pretty III, MD;  Location: [facility] OR;  Service: General;  Laterality: N/A;  . LEEP    . LYMPH GLAND EXCISION      Also reports a dry cough, chills, congestion, body aches for the past 2-3 days. Abrupt onset. Questions subj fever. Normal PO  intake without n/v/d.  Increased blood pressure noted today. Reports that she is treated for HTN.  She reports taking medications as instructed, no chest pain on exertion, no dyspnea on exertion, no LE edema, no orthostatic dizziness or lightheadedness, no orthopnea or paroxysmal nocturnal dyspnea and no palpitations.   OBJECTIVE:  Vitals:   09/22/19 1819 09/22/19 1822  BP:  (!) 155/99  Pulse:  66  Resp:  18  Temp:  98.2 F (36.8 C)  TempSrc:  Oral  SpO2:  100%  Weight: 84.4 kg   Height: 5\' 2"  (1.575 m)     General appearance: alert; no distress HEENT: [place]; AT Neck: supple with FROM Resp: unlabored respirations CV: regular Extremities: . RLE: warm with well perfused appearance; fairly well localized mild to moderate tenderness over right lateral ankle along ATFL distribuition; without gross deformities; swelling: minimal; bruising: none; ankle ROM: normal CV: brisk extremity capillary refill of RLE; 2+ DP pulse of RLE. Skin: warm and dry; no visible rashes Neurologic: gait normal; normal sensation and strength of RLE Psychological: alert and cooperative; normal mood and affect  Imaging: DG Ankle Complete Right  Result Date: 09/22/2019 CLINICAL DATA:  Inversion injury 2 weeks ago with persistent ankle pain, initial encounter EXAM: RIGHT ANKLE - COMPLETE 3+ VIEW COMPARISON:  None. FINDINGS: Mild soft tissue swelling is noted laterally. No definitive fracture or dislocation is seen. IMPRESSION: Soft tissue swelling laterally without definitive fracture. Electronically Signed   By: 11/22/2019 M.D.   On: 09/22/2019 19:44      Allergies  Allergen Reactions  . Sulfa Antibiotics Anaphylaxis and Swelling    Past Medical History:  Diagnosis Date  . Anxiety   . Asthma   . Depression   . GERD (gastroesophageal reflux disease)   . Hypertension   . Infection    UTI  . Pregnancy induced hypertension   . Vaginal Pap smear, abnormal    LEEP, normal since   Social History    Socioeconomic History  . Marital status: Significant Other    Spouse name: Not on file  . Number of children: Not on file  . Years of education: Not on file  . Highest education level: Not on file  Occupational History  . Not on file  Tobacco Use  . Smoking status: Current Every Day Smoker    Packs/day: 0.25    Types: Cigarettes  . Smokeless tobacco: Never Used  . Tobacco comment: quit June 2015  Substance and Sexual Activity  . Alcohol use: Yes    Alcohol/week: 2.0 - 3.0 standard drinks    Types: 2 - 3 Glasses of wine per week    Comment: occas  . Drug use: No  . Sexual activity: Yes    Birth control/protection: None  Other Topics Concern  . Not on file  Social History Narrative   ** Merged History Encounter **  Social Determinants of Health   Financial Resource Strain:   . Difficulty of Paying Living Expenses:   Food Insecurity:   . Worried About Programme researcher, broadcasting/film/video in the Last Year:   . Barista in the Last Year:   Transportation Needs:   . Freight forwarder (Medical):   Marland Kitchen Lack of Transportation (Non-Medical):   Physical Activity:   . Days of Exercise per Week:   . Minutes of Exercise per Session:   Stress:   . Feeling of Stress :   Social Connections:   . Frequency of Communication with Friends and Family:   . Frequency of Social Gatherings with Friends and Family:   . Attends Religious Services:   . Active Member of Clubs or Organizations:   . Attends Banker Meetings:   Marland Kitchen Marital Status:    Family History  Problem Relation Age of Onset  . Hypertension Maternal Grandmother   . Cancer Maternal Grandmother        breast  . Hypertension Maternal Grandfather   . Kidney disease Paternal Grandmother   . Heart disease Paternal Grandfather   . Hypertension Mother   . Cancer Mother        rectal  . Hypertension Father   . Hearing loss Neg Hx    Past Surgical History:  Procedure Laterality Date  . CHOLECYSTECTOMY N/A  12/18/2014   Procedure: LAPAROSCOPIC CHOLECYSTECTOMY WITH INTRAOPERATIVE CHOLANGIOGRAM;  Surgeon: Chevis Pretty III, MD;  Location: [facility] OR;  Service: General;  Laterality: N/A;  . LEEP    . LYMPH GLAND EXCISION        Mardella Layman, MD 09/23/19 [phone]

## 2019-10-06 ENCOUNTER — Ambulatory Visit: Payer: Medicaid Other | Admitting: Internal Medicine

## 2019-10-11 NOTE — Progress Notes (Deleted)
Patient ID: Kelli Wells, female   DOB: June 11, 1985, 34 y.o.   MRN: [id number]  After ED visit 09/22/2019.  From ED note: ASSESSMENT & PLAN:  1. Chills   2. Cough   3. Nasal congestion   4. Acute right ankle pain   5. Sprain of anterior talofibular ligament of right ankle, initial encounter   6. History of asthma   7. Elevated blood pressure reading in office with diagnosis of hypertension     I have personally viewed the imaging studies ordered this visit. No ankle fracture appreciated.  COVID testing sent. No indication for chest imaging at this time. Discussed viral illnesses.

## 2019-10-13 ENCOUNTER — Ambulatory Visit: Payer: Self-pay | Attending: Family Medicine

## 2019-10-19 ENCOUNTER — Other Ambulatory Visit: Payer: Self-pay

## 2019-10-19 ENCOUNTER — Encounter: Payer: Self-pay | Admitting: Internal Medicine

## 2019-10-19 ENCOUNTER — Ambulatory Visit: Payer: Self-pay | Attending: Internal Medicine | Admitting: Internal Medicine

## 2019-10-19 DIAGNOSIS — T783XXA Angioneurotic edema, initial encounter: Secondary | ICD-10-CM

## 2019-10-19 NOTE — Progress Notes (Signed)
Virtual Visit via Telephone Note  I connected with [name] on 10/19/19 at  4:00 PM EDT by telephone and verified that I am speaking with the correct person using two identifiers.  Location: Patient: Kelli Wells, home  Provider: home   I discussed the limitations, risks, security and privacy concerns of performing an evaluation and management service by telephone and the availability of in person appointments. I also discussed with the patient that there may be a patient responsible charge related to this service. The patient expressed understanding and agreed to proceed.   History of Present Illness: F/u ER 1 1/2 mos ago, swelling of lips, eyes. Covid vaccine end of April. Rxn the day after. Itching, hives last night, swelling of lips, cheese steak. No sob. Sx resolved with benadryl.    Current Outpatient Medications on File Prior to Visit  Medication Sig Dispense Refill  . albuterol (VENTOLIN HFA) 108 (90 Base) MCG/ACT inhaler Inhale 2 puffs into the lungs every 6 (six) hours as needed for wheezing or shortness of breath. 18 g 0  . amLODipine (NORVASC) 10 MG tablet Take 1 tablet (10 mg total) by mouth daily. 30 tablet 6  . carvedilol (COREG) 25 MG tablet Take 1 tablet (25 mg total) by mouth 2 (two) times daily with a meal. 60 tablet 6  . EPINEPHrine (EPIPEN 2-PAK) 0.3 mg/0.3 mL IJ SOAJ injection Inject 0.3 mLs (0.3 mg total) into the muscle as needed for anaphylaxis. 1 each 1  . fluticasone (FLONASE) 50 MCG/ACT nasal spray Place 2 sprays into both nostrils daily. 16 g 1  . HYDROcodone-homatropine (HYCODAN) 5-1.5 MG/5ML syrup Take 5 mLs by mouth every 6 (six) hours as needed for cough. 90 mL 0  . loratadine (CLARITIN) 10 MG tablet Take 1 tablet (10 mg total) by mouth daily. 30 tablet 1  . sertraline (ZOLOFT) 100 MG tablet Take 1 tablet (100 mg total) by mouth daily. 30 tablet 6  . methocarbamol (ROBAXIN) 500 MG tablet Take 1 tablet (500 mg total) by mouth every 8 (eight) hours as  needed for muscle spasms. (Patient not taking: Reported on 10/19/2019) 90 tablet 1  . naproxen (NAPROSYN) 500 MG tablet Take 1 tablet (500 mg total) by mouth 2 (two) times daily with a meal. (Patient not taking: Reported on 10/19/2019) 60 tablet 6  . predniSONE (STERAPRED UNI-PAK 21 TAB) 10 MG (21) TBPK tablet Take by mouth daily. Take as directed. (Patient not taking: Reported on 10/19/2019) 21 tablet 0   No current facility-administered medications on file prior to visit.     Observations/Objective:   Assessment and Plan: Angioedema, initial encounter  ?etiology, will refer to allergist. Advised to continue benadryl prn, avoid nsaids, robaxin.   Follow Up Instructions:    I discussed the assessment and treatment plan with the patient. The patient was provided an opportunity to ask questions and all were answered. The patient agreed with the plan and demonstrated an understanding of the instructions.   The patient was advised to call back or seek an in-person evaluation if the symptoms worsen or if the condition fails to improve as anticipated.  I provided 20 minutes of non-face-to-face time during this encounter.   Debby Bud, MD

## 2019-10-19 NOTE — Progress Notes (Signed)
HTN F /u due to an allergic reaction/ referred to PCP for allergy referral or allergy panel   Has an epi pen and benadryl home since discharged

## 2019-11-12 ENCOUNTER — Other Ambulatory Visit: Payer: Self-pay

## 2019-11-12 DIAGNOSIS — N63 Unspecified lump in unspecified breast: Secondary | ICD-10-CM

## 2019-11-16 ENCOUNTER — Other Ambulatory Visit: Payer: Self-pay

## 2019-11-16 ENCOUNTER — Ambulatory Visit: Payer: Self-pay | Admitting: *Deleted

## 2019-11-16 VITALS — BP 146/90 | Temp 98.0°F | Wt 181.9 lb

## 2019-11-16 DIAGNOSIS — N644 Mastodynia: Secondary | ICD-10-CM

## 2019-11-16 MED FILL — AMLODIPINE BESYLATE 10 MG T: 10 | 30 days supply | Qty: 30 | Fill #3

## 2019-11-16 NOTE — Progress Notes (Signed)
[name] is a 34 y.o. female who presents to [facility] clinic today with complaints of right breast pain.    Pap Smear: Pap smear not completed today. Last Pap smear was 09/17/2019 at [facility]) clinic and showed ASCUS with negative HPV. Per patient has history of an abnormal Pap smear with a LEEP procedure as a follow up in 2006. Patient has had 3 normal Pap smears since then. Last Pap smear result is available in Epic.   Physical exam: Breasts Breasts symmetrical. No skin abnormalities bilateral breasts. No nipple retraction bilateral breasts. No nipple discharge bilateral breasts. No lymphadenopathy. No lumps palpated bilateral breasts. Small scars noted on right breast on the lower outer border near the sternum and under the breast. Patient has a history of sebaceous cysts in both breasts that have had to be incised and drained. She reports having a breast ultrasound in her left breast 15 years ago in [place]. Patient has been complaining of pain in her right breast for 1 week that goes away with warm compresses. She currently complains of pain only with palpation in her left outer breast and her right inner breast towards midline.   Pelvic/Bimanual Pap is not indicated today.    Smoking History: Patient is a current smoker. She was referred to the [facility] Quit line.    Patient Navigation: Patient education provided. Access to services provided for patient through B[facility]CP program.    Breast and Cervical Cancer Risk Assessment: Patient has family history of breast cancer in her maternal grandmother. No known genetic mutations, or radiation treatment to the chest before age 64. Patient has a history of cervical dysplasia. Patient has no history of being immunocompromised or DES exposure in-utero.  Risk Assessment    Risk Scores      11/16/2019   Last edited by: Meryl Dare, CMA   5-year risk: 0.3 %   Lifetime risk: 8.5 %          A: BCCCP exam  without pap smear Complaints of intermittent right breast pain.  P: Referred patient to the [facility] for a diagnostic mammogram. Appointment scheduled 11/23/2019 at 9:10am.  Mila Homer, RN, FNP student 11/16/2019 10:39 AM   Attestation of Supervision of Student:  I confirm that I have verified the information documented in the nurse practitioner student's note and that I have also personally reperformed the history, physical exam and all medical decision making activities.  I have verified that all services and findings are accurately documented in this student's note; and I agree with management and plan as outlined in the documentation. I have also made any necessary editorial changes.  Patient has a history of 4 abnormal Pap smears with a LEEP in 2006. Patient stated all Pap smears have been normal since LEEP and that she has had at least three normal Pap smears since.   Brannock, Kathaleen Maser, RN [facility] [employer], [facility] 11/16/2019 11:59 AM

## 2019-11-16 NOTE — Patient Instructions (Addendum)
Informed Oceanographer about breast self awareness. Patient did not need a Pap smear today due to last Pap smear was on 09/17/2019. Let patient know that her next Pap smear is due in 3 years due to the ASCCP recommendation of her last Pap smear result and history. Referred patient to the [facility] for diagnostic mammogram. Appointment scheduled for November 23, 2019 at 9:10am. Patient aware of appointment and will be there. Sheetal Garson verbalized understanding.  Mila Homer, RN, FNP student 10:52 AM

## 2019-11-19 ENCOUNTER — Other Ambulatory Visit: Payer: Self-pay | Admitting: Family Medicine

## 2019-11-19 DIAGNOSIS — F419 Anxiety disorder, unspecified: Secondary | ICD-10-CM

## 2019-11-19 DIAGNOSIS — I10 Essential (primary) hypertension: Secondary | ICD-10-CM

## 2019-11-19 MED ORDER — AMLODIPINE BESYLATE 10 MG PO TABS: 10.0000 mg | ORAL_TABLET | Freq: Every day | ORAL | 6 refills | Status: DC

## 2019-11-19 MED ORDER — CARVEDILOL 25 MG PO TABS: 25.0000 mg | ORAL_TABLET | Freq: Two times a day (BID) | ORAL | 6 refills | Status: DC

## 2019-11-19 MED ORDER — SERTRALINE HCL 100 MG PO TABS: 100.0000 mg | ORAL_TABLET | Freq: Every day | ORAL | 6 refills | Status: DC

## 2019-11-19 NOTE — Telephone Encounter (Signed)
Copied from CRM [id number]. Topic: Quick Communication - Rx Refill/Question >> Nov 19, 2019  4:26 PM Mcneil, Ja-Kwan wrote: Medication: amLODipine (NORVASC) 10 MG tablet, carvedilol (COREG) 25 MG tablet, and sertraline (ZOLOFT) 100 MG tablet  Has the patient contacted their pharmacy? yes   Preferred Pharmacy (with phone number or street name): [facility] [address] [place], [place] - [address] Phone: [phone]   Fax: [phone]  Agent: Please be advised that RX refills may take up to 3 business days. We ask that you follow-up with your pharmacy.

## 2019-11-23 ENCOUNTER — Other Ambulatory Visit: Payer: Medicaid Other

## 2019-12-02 ENCOUNTER — Ambulatory Visit: Payer: Medicaid Other

## 2019-12-02 ENCOUNTER — Other Ambulatory Visit: Payer: Medicaid Other

## 2019-12-09 ENCOUNTER — Ambulatory Visit: Payer: No Typology Code available for payment source

## 2019-12-09 ENCOUNTER — Other Ambulatory Visit: Payer: Self-pay

## 2019-12-09 ENCOUNTER — Ambulatory Visit
Admission: RE | Admit: 2019-12-09 | Discharge: 2019-12-09 | Disposition: A | Discharge status: Home / Self Care | Payer: No Typology Code available for payment source | Source: Ambulatory Visit | Attending: Obstetrics and Gynecology | Admitting: Obstetrics and Gynecology

## 2019-12-09 DIAGNOSIS — N63 Unspecified lump in unspecified breast: Secondary | ICD-10-CM

## 2019-12-10 ENCOUNTER — Ambulatory Visit: Payer: Self-pay | Admitting: Allergy

## 2020-02-10 ENCOUNTER — Ambulatory Visit: Payer: Self-pay | Admitting: Allergy

## 2020-02-24 ENCOUNTER — Encounter: Payer: Self-pay | Admitting: Family Medicine

## 2020-02-24 ENCOUNTER — Telehealth: Payer: Self-pay | Admitting: Family Medicine

## 2020-02-24 NOTE — Telephone Encounter (Signed)
Copied from CRM [id number]. Topic: General - Other >> Feb 24, 2020  8:41 AM Elliot Gault wrote: Reason for CRM:   patient requesting a Dr. Note for work and school reflecting she has anxiety and depression. Patient states she's dealing with the following: COVID pandemic, pressures of being in school, patient aunt had a stroke, patient states she feels overwhelmed. Patient scheduled next available appointment with Ricky Stabs 03/09/2020, please advise if patient can be seen sooner. Informed patient please allow 48 to 72 hour turn around time

## 2020-02-24 NOTE — Telephone Encounter (Signed)
Will route to PCP.  Does patient need to wait until appointment on 03/09/20

## 2020-02-24 NOTE — Telephone Encounter (Signed)
Done   Sent via my chart

## 2020-03-06 ENCOUNTER — Ambulatory Visit (HOSPITAL_COMMUNITY)
Admission: EM | Admit: 2020-03-06 | Discharge: 2020-03-06 | Disposition: A | Discharge status: Home / Self Care | Payer: Medicaid Other | Attending: Emergency Medicine | Admitting: Emergency Medicine

## 2020-03-06 ENCOUNTER — Encounter (HOSPITAL_COMMUNITY): Payer: Self-pay

## 2020-03-06 ENCOUNTER — Other Ambulatory Visit: Payer: Self-pay

## 2020-03-06 ENCOUNTER — Other Ambulatory Visit: Payer: Self-pay | Admitting: Emergency Medicine

## 2020-03-06 DIAGNOSIS — Z20822 Contact with and (suspected) exposure to covid-19: Secondary | ICD-10-CM | POA: Insufficient documentation

## 2020-03-06 DIAGNOSIS — J069 Acute upper respiratory infection, unspecified: Secondary | ICD-10-CM | POA: Diagnosis not present

## 2020-03-06 DIAGNOSIS — J4521 Mild intermittent asthma with (acute) exacerbation: Secondary | ICD-10-CM | POA: Insufficient documentation

## 2020-03-06 DIAGNOSIS — R0981 Nasal congestion: Secondary | ICD-10-CM | POA: Diagnosis present

## 2020-03-06 MED ORDER — PREDNISONE 50 MG PO TABS: 50.0000 mg | ORAL_TABLET | Freq: Every day | ORAL | 0 refills | Status: AC

## 2020-03-06 MED ORDER — IPRATROPIUM-ALBUTEROL 0.5-2.5 (3) MG/3ML IN SOLN: 3.0000 mL | Freq: Four times a day (QID) | RESPIRATORY_TRACT | 0 refills | Status: DC | PRN

## 2020-03-06 MED ORDER — ALBUTEROL SULFATE HFA 108 (90 BASE) MCG/ACT IN AERS: 1.0000 | INHALATION_SPRAY | Freq: Four times a day (QID) | RESPIRATORY_TRACT | 0 refills | Status: DC | PRN

## 2020-03-06 MED ORDER — BENZONATATE 200 MG PO CAPS: 200.0000 mg | ORAL_CAPSULE | Freq: Three times a day (TID) | ORAL | 0 refills | Status: AC | PRN

## 2020-03-06 MED ORDER — DEXAMETHASONE 10 MG/ML FOR PEDIATRIC ORAL USE
10.0000 mg | Freq: Once | INTRAMUSCULAR | Status: AC
  Administered 2020-03-06: 10 mg via ORAL

## 2020-03-06 MED ORDER — DM-GUAIFENESIN ER 30-600 MG PO TB12: 1.0000 | ORAL_TABLET | Freq: Two times a day (BID) | ORAL | 0 refills | Status: DC

## 2020-03-06 MED ORDER — DEXAMETHASONE 10 MG/ML FOR PEDIATRIC ORAL USE
INTRAMUSCULAR | Status: AC
  Filled 2020-03-06: qty 1

## 2020-03-06 NOTE — ED Provider Notes (Signed)
[facility]    CSN: [id number] Arrival date & time: 03/06/20  1653      History   Chief Complaint Chief Complaint  Patient presents with  . Nasal Congestion    HPI Kelli Wells is a 34 y.o. female history of GERD, hypertension, asthma, presenting today for evaluation of cough and congestion.  Patient reports beginning today she has developed URI symptoms which have subsequently exacerbated her asthma.  Reports wheezing shortness of breath and chest tightness.  Denies fevers.  Denies any known Covid exposures.  Using albuterol inhaler without relief.   HPI  Past Medical History:  Diagnosis Date  . Anxiety   . Asthma   . Depression   . GERD (gastroesophageal reflux disease)   . Hypertension   . Infection    UTI  . Pregnancy induced hypertension   . Vaginal Pap smear, abnormal    LEEP, normal since    Patient Active Problem List   Diagnosis Date Noted  . Hypertension 12/14/2015  . Anxiety and depression 12/14/2015  . Cholecystitis with cholelithiasis 12/17/2014  . Pregnancy 05/22/2014  . Postpartum state 05/12/2014  . Chronic hypertension in pregnancy   . [redacted] weeks gestation of pregnancy   . Non-reactive NST (non-stress test)   . [redacted] weeks gestation of pregnancy     Past Surgical History:  Procedure Laterality Date  . CHOLECYSTECTOMY N/A 12/18/2014   Procedure: LAPAROSCOPIC CHOLECYSTECTOMY WITH INTRAOPERATIVE CHOLANGIOGRAM;  Surgeon: Chevis Pretty III, MD;  Location: [facility];  Service: General;  Laterality: N/A;  . LEEP    . LYMPH GLAND EXCISION      OB History    Gravida  2   Para  2   Term  2   Preterm  0   AB  0   Living  2     SAB  0   TAB  0   Ectopic  0   Multiple  0   Live Births  2            Home Medications    Prior to Admission medications   Medication Sig Start Date End Date Taking? Authorizing Provider  albuterol (VENTOLIN HFA) 108 (90 Base) MCG/ACT inhaler Inhale 1-2 puffs into the lungs every 6 (six)  hours as needed for wheezing or shortness of breath. 03/06/20   Rowen Hur C, PA-C  amLODipine (NORVASC) 10 MG tablet Take 1 tablet (10 mg total) by mouth daily. 11/19/19   Hoy Register, MD  benzonatate (TESSALON) 200 MG capsule Take 1 capsule (200 mg total) by mouth 3 (three) times daily as needed for up to 7 days for cough. 03/06/20 03/13/20  Shamar Kracke C, PA-C  carvedilol (COREG) 25 MG tablet Take 1 tablet (25 mg total) by mouth 2 (two) times daily with a meal. 11/19/19   Newlin, Enobong, MD  dextromethorphan-guaiFENesin (MUCINEX DM) 30-600 MG 12hr tablet Take 1 tablet by mouth 2 (two) times daily. 03/06/20   Dotti Busey C, PA-C  EPINEPHrine (EPIPEN 2-PAK) 0.3 mg/0.3 mL IJ SOAJ injection Inject 0.3 mLs (0.3 mg total) into the muscle as needed for anaphylaxis. 09/08/19   Zadie Rhine, MD  fluticasone (FLONASE) 50 MCG/ACT nasal spray Place 2 sprays into both nostrils daily. 06/21/19   Hoy Register, MD  HYDROcodone-homatropine (HYCODAN) 5-1.5 MG/5ML syrup Take 5 mLs by mouth every 6 (six) hours as needed for cough. 09/22/19   Mardella Layman, MD  ipratropium-albuterol (DUONEB) 0.5-2.5 (3) MG/3ML SOLN Take 3 mLs by nebulization every 6 (  six) hours as needed. 03/06/20   Gil Ingwersen C, PA-C  loratadine (CLARITIN) 10 MG tablet Take 1 tablet (10 mg total) by mouth daily. 10/20/18   Hoy Register, MD  methocarbamol (ROBAXIN) 500 MG tablet Take 1 tablet (500 mg total) by mouth every 8 (eight) hours as needed for muscle spasms. 09/29/18   Hoy Register, MD  naproxen (NAPROSYN) 500 MG tablet Take 1 tablet (500 mg total) by mouth 2 (two) times daily with a meal. 09/29/18   Hoy Register, MD  predniSONE (DELTASONE) 50 MG tablet Take 1 tablet (50 mg total) by mouth daily with breakfast for 5 days. 03/06/20 03/11/20  Kian Gamarra C, PA-C  sertraline (ZOLOFT) 100 MG tablet Take 1 tablet (100 mg total) by mouth daily. 11/19/19   Hoy Register, MD    Family History Family History  Problem  Relation Age of Onset  . Hypertension Maternal Grandmother   . Cancer Maternal Grandmother        breast  . Hypertension Maternal Grandfather   . Heart attack Maternal Grandfather   . Kidney disease Paternal Grandmother   . Heart disease Paternal Grandfather   . Hypertension Mother   . Cancer Mother        rectal  . Hearing loss Neg Hx     Social History Social History   Tobacco Use  . Smoking status: Current Every Day Smoker    Packs/day: 0.25    Types: Cigarettes  . Smokeless tobacco: Never Used  . Tobacco comment: quit June 2015  Vaping Use  . Vaping Use: Never used  Substance Use Topics  . Alcohol use: Yes    Alcohol/week: 2.0 - 3.0 standard drinks    Types: 2 - 3 Glasses of wine per week    Comment: occas  . Drug use: No     Allergies   Sulfa antibiotics   Review of Systems Review of Systems  Constitutional: Negative for activity change, appetite change, chills, fatigue and fever.  HENT: Positive for congestion and rhinorrhea. Negative for ear pain, sinus pressure, sore throat and trouble swallowing.   Eyes: Negative for discharge and redness.  Respiratory: Positive for cough, shortness of breath and wheezing. Negative for chest tightness.   Cardiovascular: Negative for chest pain.  Gastrointestinal: Negative for abdominal pain, diarrhea, nausea and vomiting.  Musculoskeletal: Negative for myalgias.  Skin: Negative for rash.  Neurological: Negative for dizziness, light-headedness and headaches.     Physical Exam Triage Vital Signs ED Triage Vitals  Enc Vitals Group     BP      Pulse      Resp      Temp      Temp src      SpO2      Weight      Height      Head Circumference      Peak Flow      Pain Score      Pain Loc      Pain Edu?      Excl. in GC?    No data found.  Updated Vital Signs BP (!) 144/88 (BP Location: Right Arm)   Pulse 96   Temp 98 F (36.7 C) (Oral)   Resp 20   SpO2 95%   Visual Acuity Right Eye Distance:   Left  Eye Distance:   Bilateral Distance:    Right Eye Near:   Left Eye Near:    Bilateral Near:     Physical Exam Vitals and  nursing note reviewed.  Constitutional:      Appearance: She is well-developed.     Comments: No acute distress  HENT:     Head: Normocephalic and atraumatic.     Ears:     Comments: Bilateral ears without tenderness to palpation of external auricle, tragus and mastoid, EAC's without erythema or swelling, TM's with good bony landmarks and cone of light. Non erythematous.     Nose: Nose normal.     Mouth/Throat:     Comments: Oral mucosa pink and moist, no tonsillar enlargement or exudate. Posterior pharynx patent and nonerythematous, no uvula deviation or swelling. Normal phonation. Eyes:     Conjunctiva/sclera: Conjunctivae normal.  Cardiovascular:     Rate and Rhythm: Normal rate and regular rhythm.  Pulmonary:     Effort: Pulmonary effort is normal. No respiratory distress.     Breath sounds: Wheezing present.     Comments: Breathing comfortably at rest, inspiratory and expiratory wheezing noted throughout bilateral lung fields Abdominal:     General: There is no distension.  Musculoskeletal:        General: Normal range of motion.     Cervical back: Neck supple.  Skin:    General: Skin is warm and dry.  Neurological:     Mental Status: She is alert and oriented to person, place, and time.      UC Treatments / Results  Labs (all labs ordered are listed, but only abnormal results are displayed) Labs Reviewed - No data to display  EKG   Radiology No results found.  Procedures Procedures (including critical care time)  Medications Ordered in UC Medications  dexamethasone (DECADRON) 10 MG/ML injection for Pediatric ORAL use 10 mg (has no administration in time range)    Initial Impression / Assessment and Plan / UC Course  I have reviewed the triage vital signs and the nursing notes.  Pertinent labs & imaging results that were available  during my care of the patient were reviewed by me and considered in my medical decision making (see chart for details).     Viral URI with asthma exacerbation, providing Decadron prior to discharge and continuing on prednisone x5 days, providing nebulizer machine to do duo nebs at home as alternative to albuterol inhaler.  Tessalon and Mucinex DM, rest and fluids.  Covid test pending.  Discussed strict return precautions. Patient verbalized understanding and is agreeable with plan.  Final Clinical Impressions(s) / UC Diagnoses   Final diagnoses:  Viral URI with cough  Mild intermittent asthma with acute exacerbation     Discharge Instructions     We gave you a dose of Decadron, continue with prednisone daily for 5 days beginning tomorrow morning-take with food and in the morning if able Albuterol inhaler or DuoNeb's at home for shortness of breath, chest tightness and wheezing Tessalon for cough Mucinex DM to further help relieve congestion and cough Rest and fluids Covid test pending, monitor my chart for results    ED Prescriptions    Medication Sig Dispense Auth. Provider   ipratropium-albuterol (DUONEB) 0.5-2.5 (3) MG/3ML SOLN Take 3 mLs by nebulization every 6 (six) hours as needed. 120 mL Carel Carrier C, PA-C   albuterol (VENTOLIN HFA) 108 (90 Base) MCG/ACT inhaler Inhale 1-2 puffs into the lungs every 6 (six) hours as needed for wheezing or shortness of breath. 18 g Rene Sizelove C, PA-C   predniSONE (DELTASONE) 50 MG tablet Take 1 tablet (50 mg total) by mouth daily with breakfast for  5 days. 5 tablet Aundray Cartlidge C, PA-C   benzonatate (TESSALON) 200 MG capsule Take 1 capsule (200 mg total) by mouth 3 (three) times daily as needed for up to 7 days for cough. 28 capsule Earnesteen Birnie C, PA-C   dextromethorphan-guaiFENesin (MUCINEX DM) 30-600 MG 12hr tablet Take 1 tablet by mouth 2 (two) times daily. 20 tablet Velinda Wrobel, De WittHallie C, PA-C     PDMP not reviewed this  encounter.   Lew DawesWieters, Lashelle Koy C, [place] JerseyPA-C 03/06/20 1927

## 2020-03-06 NOTE — ED Notes (Signed)
Patient waiting in car [phone]

## 2020-03-06 NOTE — Discharge Instructions (Signed)
We gave you a dose of Decadron, continue with prednisone daily for 5 days beginning tomorrow morning-take with food and in the morning if able Albuterol inhaler or DuoNeb's at home for shortness of breath, chest tightness and wheezing Tessalon for cough Mucinex DM to further help relieve congestion and cough Rest and fluids Covid test pending, monitor my chart for results

## 2020-03-06 NOTE — ED Triage Notes (Signed)
Pt reports having nasal con chest congestion and cough since this morning. States she feels like having bronchitis.

## 2020-03-07 LAB — SARS CORONAVIRUS 2 (TAT 6-24 HRS): SARS Coronavirus 2: NEGATIVE

## 2020-03-07 MED FILL — BENZONATATE 100 MG CAPS: 100 | 9 days supply | Qty: 56 | Fill #0

## 2020-03-07 MED FILL — ALBUTEROL SULFATE HFA 108 (: 108 (90 BAS | 25 days supply | Qty: 18 | Fill #0

## 2020-03-07 MED FILL — IPRAT-ALBUT 0.5-3(2.5) MG/3: 0.5-2.5 (3) | 30 days supply | Qty: 360 | Fill #0

## 2020-03-09 ENCOUNTER — Telehealth: Payer: Self-pay | Admitting: Family Medicine

## 2020-03-09 ENCOUNTER — Ambulatory Visit: Payer: Self-pay | Attending: Family | Admitting: Family

## 2020-03-09 ENCOUNTER — Other Ambulatory Visit: Payer: Self-pay

## 2020-03-09 DIAGNOSIS — F32A Depression, unspecified: Secondary | ICD-10-CM

## 2020-03-09 DIAGNOSIS — Z59811 Housing instability, housed, with risk of homelessness: Secondary | ICD-10-CM

## 2020-03-09 DIAGNOSIS — F419 Anxiety disorder, unspecified: Secondary | ICD-10-CM

## 2020-03-09 MED ORDER — CLONAZEPAM 0.5 MG PO TABS: 0.5000 mg | ORAL_TABLET | Freq: Every day | ORAL | 0 refills | Status: DC | PRN

## 2020-03-09 MED ORDER — BUSPIRONE HCL 5 MG PO TABS: 5.0000 mg | ORAL_TABLET | Freq: Two times a day (BID) | ORAL | 0 refills | Status: DC

## 2020-03-09 MED ORDER — GABAPENTIN 100 MG PO CAPS: 100.0000 mg | ORAL_CAPSULE | Freq: Two times a day (BID) | ORAL | 0 refills | Status: DC

## 2020-03-09 MED ORDER — SERTRALINE HCL 100 MG PO TABS: 100.0000 mg | ORAL_TABLET | Freq: Every day | ORAL | 2 refills | Status: DC

## 2020-03-09 MED FILL — SERTRALINE HCL 100 MG TAB: 100 | 30 days supply | Qty: 30 | Fill #0

## 2020-03-09 MED FILL — busPIRone HCL 5 MG TABS: 5 | 30 days supply | Qty: 60 | Fill #0

## 2020-03-09 NOTE — Telephone Encounter (Signed)
Copied from CRM [id number]. Topic: General - Other >> Mar 09, 2020  4:18 PM Jaquita Rector A wrote: Reason for CRM: Patient called in asking [facility] to please send Rx for  clonazePAM Scarlette Calico) 0.5 MG tablet  to [facility] [address] [place], [place] - [address] Phone:  [phone] Fax:  [phone] since [facility] pharmacy say that they cannot fill this Rx. Please advise Ph# [phone]

## 2020-03-09 NOTE — Patient Instructions (Signed)

## 2020-03-09 NOTE — Progress Notes (Signed)
Virtual Visit via Telephone Note  I connected with Kelli Wells, on 03/09/2020 at 10:35 AM by telephone due to the COVID-19 pandemic and verified that I am speaking with the correct person using two identifiers.  Due to current restrictions/limitations of in-office visits due to the COVID-19 pandemic, this scheduled clinical appointment was converted to a telehealth visit.   Consent: I discussed the limitations, risks, security and privacy concerns of performing an evaluation and management service by telephone and the availability of in person appointments. I also discussed with the patient that there may be a patient responsible charge related to this service. The patient expressed understanding and agreed to proceed.  Location of Patient: Home  Location of Provider: [facility]  Persons participating in Telemedicine visit: Kelli Wells Kelli Wells Kelli Kief, NP Kelli Levels, RN  History of Present Illness: CC: Anxiety and Depression Follow-Up  Subjective: Kelli Wells is a 34 y.o. female with history of hypertension, cholecystitis with cholelithiasis, anxiety and depression who presents for anxiety and depression follow-up.   1. ANXIETY/DEPRESSION FOLLOW-UP: 06/21/2019: Visit with Dr. Alvis Lemmings. Stable and continued on Zoloft. Will follow-up with her school regarding obtaining the form for completion.  03/09/2020: Duration:worse Anxious mood: yes  Excessive worrying: yes Irritability: yes  Sweating: no Nausea: no Palpitations:yes Hyperventilation: yes Panic attacks: yes Agoraphobia: no  Obscessions/compulsions: denies Depressed mood: yes Anhedonia: no Weight changes: gaining  Insomnia: no  Hypersomnia: no Fatigue/loss of energy: no Feelings of worthlessness: sometimes Feelings of guilt: no  Impaired concentration/indecisiveness: yes, and history of ADHD, no medications because financial reasons Suicidal ideations: no   Crying spells: yes Recent Stressors/Life Changes: yes   Family stress: yes     Financial stress: yes    Job stress: yes    Recent death/loss: aunt and Firefighter both passed away on the same day for similar reasons   Past Medical History:  Diagnosis Date  . Anxiety   . Asthma   . Depression   . GERD (gastroesophageal reflux disease)   . Hypertension   . Infection    UTI  . Pregnancy induced hypertension   . Vaginal Pap smear, abnormal    LEEP, normal since   Allergies  Allergen Reactions  . Sulfa Antibiotics Anaphylaxis and Swelling    Current Outpatient Medications on File Prior to Visit  Medication Sig Dispense Refill  . albuterol (VENTOLIN HFA) 108 (90 Base) MCG/ACT inhaler Inhale 1-2 puffs into the lungs every 6 (six) hours as needed for wheezing or shortness of breath. 18 g 0  . amLODipine (NORVASC) 10 MG tablet Take 1 tablet (10 mg total) by mouth daily. 30 tablet 6  . benzonatate (TESSALON) 200 MG capsule Take 1 capsule (200 mg total) by mouth 3 (three) times daily as needed for up to 7 days for cough. 28 capsule 0  . carvedilol (COREG) 25 MG tablet Take 1 tablet (25 mg total) by mouth 2 (two) times daily with a meal. 60 tablet 6  . dextromethorphan-guaiFENesin (MUCINEX DM) 30-600 MG 12hr tablet Take 1 tablet by mouth 2 (two) times daily. 20 tablet 0  . EPINEPHrine (EPIPEN 2-PAK) 0.3 mg/0.3 mL IJ SOAJ injection Inject 0.3 mLs (0.3 mg total) into the muscle as needed for anaphylaxis. 1 each 1  . fluticasone (FLONASE) 50 MCG/ACT nasal spray Place 2 sprays into both nostrils daily. 16 g 1  . HYDROcodone-homatropine (HYCODAN) 5-1.5 MG/5ML syrup Take 5 mLs by mouth every 6 (six) hours as needed for cough. 90 mL  0  . ipratropium-albuterol (DUONEB) 0.5-2.5 (3) MG/3ML SOLN Take 3 mLs by nebulization every 6 (six) hours as needed. 120 mL 0  . loratadine (CLARITIN) 10 MG tablet Take 1 tablet (10 mg total) by mouth daily. 30 tablet 1  . methocarbamol (ROBAXIN) 500 MG tablet  Take 1 tablet (500 mg total) by mouth every 8 (eight) hours as needed for muscle spasms. 90 tablet 1  . naproxen (NAPROSYN) 500 MG tablet Take 1 tablet (500 mg total) by mouth 2 (two) times daily with a meal. 60 tablet 6  . predniSONE (DELTASONE) 50 MG tablet Take 1 tablet (50 mg total) by mouth daily with breakfast for 5 days. 5 tablet 0  . sertraline (ZOLOFT) 100 MG tablet Take 1 tablet (100 mg total) by mouth daily. 30 tablet 6   No current facility-administered medications on file prior to visit.    Observations/Objective: Alert and oriented x 3. Not in acute distress. Physical examination not completed as this is a telemedicine visit.  Assessment and Plan: 1. Anxiety and depression:  - Uncontrolled and worsening since two recent deaths close to her on last week. Increased anxiety and depression also related to family/home-work balance and currently being enrolled at [employer]. - Denies thoughts of self-harm and suicidal ideation.  - Continue Sertraline as prescribed.  - Adding low-dose Gabapentin to help with increased anxiety and depression. Do not consume with alcohol and/or illicit substances. Discussed potential side effects of medication and patient verbalized understanding. Follow-up with primary physician in 6 weeks or sooner if needed.  - Patient expresses concern about attending the funerals on this upcoming weekend. A two day course of Clonazepam prescribed. May cause drowsiness. Counseled patient to not consume if operating heavy machinery or driving. Counseled patient to not consume with alcohol or illicit substances. Patient verbalized understanding. I did check the [facility] prescription drug database and found no frequent prescribers of opiates or evidence of aberrant behavior. - Referral to Social Work for counseling and community resources.  - Ambulatory referral to Social Work - sertraline (ZOLOFT) 100 MG tablet; Take 1 tablet (100 mg total)  by mouth daily.  Dispense: 30 tablet; Refill: 2 - gabapentin (NEURONTIN) 100 MG capsule; Take 1 capsule (100 mg total) by mouth 2 (two) times daily.  Dispense: 60 capsule; Refill: 0 - clonazePAM (KLONOPIN) 0.5 MG tablet; Take 1 tablet (0.5 mg total) by mouth daily as needed for up to 2 days for anxiety.  Dispense: 2 tablet; Refill: 0  2. Housing instability, housed, with risk of homelessness: - Patient currently has housing. Reports on 03/02/2020 she was served notice that the landlord is deciding to close housing at several apartments/units that he owns. Reports she has until 05/12/2020 to find housing for her children (32 year-old and 25 year-old), boyfriend, and herself. Currently has a housing voucher which is active through 03/31/2020. - Referral to Social Work for counseling and community resources.  - Ambulatory referral to Social Work.   Follow Up Instructions: Follow-up with primary physician in 6 weeks or sooner if needed.   Patient was given clear instructions to go to Emergency Department or return to medical center if symptoms don't improve, worsen, or new problems develop.The patient verbalized understanding.  I discussed the assessment and treatment plan with the patient. The patient was provided an opportunity to ask questions and all were answered. The patient agreed with the plan and demonstrated an understanding of the instructions.   The patient was advised to call  back or seek an in-person evaluation if the symptoms worsen or if the condition fails to improve as anticipated.   I provided 26 minutes total of non-face-to-face time during this encounter including median intraservice time, reviewing previous notes, labs, imaging, medications, management and patient verbalized understanding.    Rema Fendt, NP  [facility] [place], [place] [phone]   03/09/2020, 10:27 AM

## 2020-03-10 MED FILL — GABAPENTIN 100 MG CAPSULE: 100 | 30 days supply | Qty: 60 | Fill #0

## 2020-03-13 ENCOUNTER — Encounter: Payer: No Typology Code available for payment source | Admitting: Licensed Clinical Social Worker

## 2020-04-05 MED FILL — GABAPENTIN 100 MG CAPSULE: 100 | 30 days supply | Qty: 60 | Fill #0

## 2020-04-20 ENCOUNTER — Other Ambulatory Visit: Payer: Self-pay

## 2020-04-20 ENCOUNTER — Ambulatory Visit: Payer: Medicaid Other | Attending: Family Medicine | Admitting: Licensed Clinical Social Worker

## 2020-04-20 DIAGNOSIS — F4323 Adjustment disorder with mixed anxiety and depressed mood: Secondary | ICD-10-CM | POA: Diagnosis not present

## 2020-04-20 NOTE — Progress Notes (Signed)
Integrated Behavioral Health via Telemedicine Visit  04/20/2020 Kelli Wells [id number]  Number of Integrated Behavioral Health visits: 1 Session Start time: 4:05 PM  Session End time: 4:25 PM Total time: 20  Referring Provider: NP Zonia Kief Patient location: Home [facility] Provider location: Office All persons participating in visit: LCSW and Patient Types of Service: Individual psychotherapy  I connected with Kelli Wells by Telephone and verified that I am speaking with the correct person using two identifiers.    Discussed confidentiality: Yes   I discussed the limitations of telemedicine and the availability of in person appointments.  Discussed there is a possibility of technology failure and discussed alternative modes of communication if that failure occurs.  I discussed that engaging in this telemedicine visit, they consent to the provision of behavioral healthcare and the services will be billed under their insurance.  Patient and/or legal guardian expressed understanding and consented to Telemedicine visit: Yes   Presenting Concerns: Patient and/or family reports the following symptoms/concerns: Pt reports symptoms of anxiety and depression triggered by grief. Symptoms have decreased slightly. Pt is currently unemployed due to difficulty managing symptoms and being a Physicist, medical. Pt has applied for CARES Act through DSS and recently completed finals in school. Pt has to move due to increase in the rent and does not have a place to go. Had difficulty finding units that accept Section 8 voucher which expired November 19; however, HUD will allow her to continue to search units. Pt has pet so shelter options are few Duration of problem: Pt has hx of depression and anxiety; Severity of problem: severe  Patient and/or Family's Strengths/Protective Factors: Social connections, Social and Emotional competence and Sense of purpose  Goals Addressed: Patient  will: 1.  Demonstrate ability to: Increase healthy adjustment to current life circumstances and Increase adequate support systems for patient/family Pt agreed to LCSW completing a referral to the Eviction Mediation Program to strengthen support system. Pt agreed to schedule a psychiatry appointment with [facility]  Progress towards Goals: Ongoing  Interventions: Interventions utilized:  Solution-Focused Strategies Standardized Assessments completed: Not Needed  Patient Response: Pt was engaged during session and was open to referrals to strengthen support system  Assessment: Patient currently experiencing difficulty managing anxiety and depression symptoms triggered by grief and psychosocial stressors.   Patient may benefit from supportive services, therapy, and medication management. LCSW completed a referral to the Eviction Mediation program through [facility] for Housing and Community Studies to assist with housing. Pt agreed to follow up with [facility] to schedule appointment with psychiatry  Plan: 1. Follow up with behavioral health clinician on : Contact LCSW with any additional behavioral health and/or resource needs 2. Behavioral recommendations: Utilize strategies discussed and resources provided 3. Referral(s): Paramedic (LME/Outside Clinic) and Community Resources:  Finances and Housing  I discussed the assessment and treatment plan with the patient and/or parent/guardian. They were provided an opportunity to ask questions and all were answered. They agreed with the plan and demonstrated an understanding of the instructions.   They were advised to call back or seek an in-person evaluation if the symptoms worsen or if the condition fails to improve as anticipated.  Bridgett Larsson, LCSW  05/10/2020 3:57 PM

## 2020-05-08 ENCOUNTER — Telehealth: Payer: Self-pay | Admitting: Family Medicine

## 2020-05-08 DIAGNOSIS — T783XXA Angioneurotic edema, initial encounter: Secondary | ICD-10-CM

## 2020-05-08 NOTE — Telephone Encounter (Signed)
Pt called in for assistance. Pt says that she had a reaction to her first vaccine that caused her not to have the second. Pt says that she was seen at [facility]. Pt says that she starts a new job and has to provide an exemption note so that she doesn't have to take the second one. Pt says that she swelled up really bad.   Pt would like further assistance. Pt would like to know if provider could place her note in mychart if possible.

## 2020-05-09 NOTE — Telephone Encounter (Signed)
Will route to PCP for review. 

## 2020-05-10 NOTE — Telephone Encounter (Signed)
I have reviewed her chart and seen that she did have lip swelling which occurred prior to receiving the COVID vaccine and after and per ED notes etiology was unclear.  She was referred to an allergist in 10/2019 but they were never able to contact her.  She will need to see an allergist to determine the cause of the lip swelling and have referred her again.

## 2020-05-28 ENCOUNTER — Encounter: Payer: Self-pay | Admitting: Family Medicine

## 2020-05-31 ENCOUNTER — Encounter: Payer: Self-pay | Admitting: Family Medicine

## 2020-08-02 ENCOUNTER — Other Ambulatory Visit: Payer: Self-pay

## 2020-08-02 ENCOUNTER — Encounter: Payer: Self-pay | Admitting: Family Medicine

## 2020-08-02 ENCOUNTER — Ambulatory Visit: Payer: Medicaid Other | Attending: Family Medicine | Admitting: Family Medicine

## 2020-08-02 VITALS — BP 117/75 | HR 86 | Ht 62.0 in | Wt 190.0 lb

## 2020-08-02 DIAGNOSIS — F419 Anxiety disorder, unspecified: Secondary | ICD-10-CM | POA: Diagnosis not present

## 2020-08-02 DIAGNOSIS — Z23 Encounter for immunization: Secondary | ICD-10-CM | POA: Diagnosis not present

## 2020-08-02 DIAGNOSIS — J4520 Mild intermittent asthma, uncomplicated: Secondary | ICD-10-CM

## 2020-08-02 DIAGNOSIS — R41840 Attention and concentration deficit: Secondary | ICD-10-CM | POA: Diagnosis not present

## 2020-08-02 DIAGNOSIS — F32A Depression, unspecified: Secondary | ICD-10-CM

## 2020-08-02 DIAGNOSIS — I10 Essential (primary) hypertension: Secondary | ICD-10-CM

## 2020-08-02 DIAGNOSIS — J45909 Unspecified asthma, uncomplicated: Secondary | ICD-10-CM | POA: Insufficient documentation

## 2020-08-02 MED ORDER — AMLODIPINE BESYLATE 10 MG PO TABS
10.0000 mg | ORAL_TABLET | Freq: Every day | ORAL | 6 refills | Status: DC
  Filled 2021-03-05: qty 30, 30d supply, fill #0

## 2020-08-02 MED ORDER — ALBUTEROL SULFATE HFA 108 (90 BASE) MCG/ACT IN AERS: 1.0000 | INHALATION_SPRAY | Freq: Four times a day (QID) | RESPIRATORY_TRACT | 0 refills | Status: DC | PRN

## 2020-08-02 MED ORDER — SERTRALINE HCL 100 MG PO TABS
100.0000 mg | ORAL_TABLET | Freq: Every day | ORAL | 6 refills | Status: DC
  Filled 2021-03-05: qty 30, 30d supply, fill #0

## 2020-08-02 MED ORDER — CARVEDILOL 25 MG PO TABS
25.0000 mg | ORAL_TABLET | Freq: Two times a day (BID) | ORAL | 6 refills | Status: DC
  Filled 2021-03-05: qty 60, 30d supply, fill #0

## 2020-08-02 NOTE — Progress Notes (Signed)
Subjective:  Patient ID: Kelli Wells, female    DOB: [birth year]  Age: 35 y.o. MRN: [id number]  CC: Hypertension   HPI Kelli Wells  is a 35 year old female with a history of hypertension, anxiety and depression who presents today for follow-up visit.  Complains she has trouble focusing and thinks she might have ADHD.  She was not diagnosed as a child but would like to be evaluated for this.  Requests prescription for Klonopin for her anxiety as she states she received a few pills of this during her aunt's funeral and it helped her. She is currently on Zoloft.  Was on BuSpar in the past but had to discontinue this due to increased sedation. She is compliant with her antihypertensive. 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 Past Medical History:  Diagnosis Date  . Anxiety   . Asthma   . Depression   . GERD (gastroesophageal reflux disease)   . Hypertension   . Infection    UTI  . Pregnancy induced hypertension   . Vaginal Pap smear, abnormal    LEEP, normal since    Past Surgical History:  Procedure Laterality Date  . CHOLECYSTECTOMY N/A 12/18/2014   Procedure: LAPAROSCOPIC CHOLECYSTECTOMY WITH INTRAOPERATIVE CHOLANGIOGRAM;  Surgeon: Chevis Pretty III, MD;  Location: [facility] OR;  Service: General;  Laterality: N/A;  . LEEP    . LYMPH GLAND EXCISION      Family History  Problem Relation Age of Onset  . Hypertension Maternal Grandmother   . Cancer Maternal Grandmother        breast  . Hypertension Maternal Grandfather   . Heart attack Maternal Grandfather   . Kidney disease Paternal Grandmother   . Heart disease Paternal Grandfather   . Hypertension Mother   . Cancer Mother        rectal  . Hearing loss Neg Hx     Allergies  Allergen Reactions  . Sulfa Antibiotics Anaphylaxis and Swelling  . Shellfish Allergy Swelling    Facial  swelling     Outpatient Medications Prior to Visit  Medication Sig Dispense Refill  . clonazePAM (KLONOPIN) 0.5 MG tablet Take 1 tablet (0.5 mg total) by mouth daily as needed for up to 2 days for anxiety. 2 tablet 0  . dextromethorphan-guaiFENesin (MUCINEX DM) 30-600 MG 12hr tablet Take 1 tablet by mouth 2 (two) times daily. 20 tablet 0  . EPINEPHrine (EPIPEN 2-PAK) 0.3 mg/0.3 mL IJ SOAJ injection Inject 0.3 mLs (0.3 mg total) into the muscle as needed for anaphylaxis. 1 each 1  . fluticasone (FLONASE) 50 MCG/ACT nasal spray Place 2 sprays into both nostrils daily. 16 g 1  . HYDROcodone-homatropine (HYCODAN) 5-1.5 MG/5ML syrup Take 5 mLs by mouth every 6 (six) hours as needed for cough. 90 mL 0  . ipratropium-albuterol (DUONEB) 0.5-2.5 (3) MG/3ML SOLN Take 3 mLs by nebulization every 6 (six) hours as needed. 120 mL 0  . loratadine (CLARITIN) 10 MG tablet Take 1 tablet (10 mg total) by mouth daily. 30 tablet 1  . methocarbamol (ROBAXIN) 500 MG tablet Take 1 tablet (500 mg total) by mouth every 8 (eight) hours as needed for muscle spasms. 90 tablet 1  . naproxen (NAPROSYN) 500 MG tablet Take 1 tablet (500 mg total) by mouth 2 (two) times daily with a meal. 60 tablet 6  . albuterol (VENTOLIN HFA) 108 (90  Base) MCG/ACT inhaler Inhale 1-2 puffs into the lungs every 6 (six) hours as needed for wheezing or shortness of breath. 18 g 0  . amLODipine (NORVASC) 10 MG tablet Take 1 tablet (10 mg total) by mouth daily. 30 tablet 6  . carvedilol (COREG) 25 MG tablet Take 1 tablet (25 mg total) by mouth 2 (two) times daily with a meal. 60 tablet 6  . gabapentin (NEURONTIN) 100 MG capsule Take 1 capsule (100 mg total) by mouth 2 (two) times daily. 60 capsule 0  . sertraline (ZOLOFT) 100 MG tablet Take 1 tablet (100 mg total) by mouth daily. 30 tablet 2   No facility-administered medications prior to visit.     ROS Review of Systems  Constitutional: Negative for activity change, appetite change and  fatigue.  HENT: Negative for congestion, sinus pressure and sore throat.   Eyes: Negative for visual disturbance.  Respiratory: Negative for cough, chest tightness, shortness of breath and wheezing.   Cardiovascular: Negative for chest pain and palpitations.  Gastrointestinal: Negative for abdominal distention, abdominal pain and constipation.  Endocrine: Negative for polydipsia.  Genitourinary: Negative for dysuria and frequency.  Musculoskeletal: Negative for arthralgias and back pain.  Skin: Negative for rash.  Neurological: Negative for tremors, light-headedness and numbness.  Hematological: Does not bruise/bleed easily.  Psychiatric/Behavioral: Negative for agitation and behavioral problems.    Objective:  BP 117/75   Pulse 86   Ht 5\' 2"  (1.575 m)   Wt 190 lb (86.2 kg)   SpO2 98%   BMI 34.75 kg/m   BP/Weight 08/02/2020 03/06/2020 11/16/2019  Systolic BP 117 144 146  Diastolic BP 75 88 90  Wt. (Lbs) 190 - 181.9  BMI 34.75 - 33.27      Physical Exam Constitutional:      Appearance: She is well-developed.  Neck:     Vascular: No JVD.  Cardiovascular:     Rate and Rhythm: Normal rate.     Heart sounds: Normal heart sounds. No murmur heard.   Pulmonary:     Effort: Pulmonary effort is normal.     Breath sounds: Normal breath sounds. No wheezing or rales.  Chest:     Chest wall: No tenderness.  Abdominal:     General: Bowel sounds are normal. There is no distension.     Palpations: Abdomen is soft. There is no mass.     Tenderness: There is no abdominal tenderness.  Musculoskeletal:        General: Normal range of motion.     Right lower leg: No edema.     Left lower leg: No edema.  Neurological:     Mental Status: She is alert and oriented to person, place, and time.  Psychiatric:        Mood and Affect: Mood normal.     CMP Latest Ref Rng & Units 06/21/2019 06/22/2018 09/01/2017  Glucose 65 - 99 mg/dL 76 84 76  BUN 6 - 20 mg/dL 7 9 7   Creatinine 0.57 - 1.00  mg/dL 09/03/2017 3.97  Sodium 134 - 144 mmol/L 135 137 139  Potassium 3.5 - 5.2 mmol/L 4.3 4.0 3.6  Chloride 96 - 106 mmol/L 102 104 108  CO2 20 - 29 mmol/L 20 21 22   Calcium 8.7 - 10.2 mg/dL 9.5 9.2 9.3  Total Protein 6.0 - 8.5 g/dL 6.8 - -  Total Bilirubin 0.0 - 1.2 mg/dL 0.2 - -  Alkaline Phos 39 - 117 IU/L 104 - -  AST 0 - 40  IU/L 9 - -  ALT 0 - 32 IU/L 11 - -    Lipid Panel     Component Value Date/Time   CHOL 191 09/26/2017 0906   TRIG 47 09/26/2017 0906   HDL 61 09/26/2017 0906   CHOLHDL 3.1 09/26/2017 0906   CHOLHDL 3.4 01/01/2016 1020   VLDL 10 01/01/2016 1020   LDLCALC 121 (H) 09/26/2017 0906    CBC    Component Value Date/Time   WBC 8.1 06/21/2019 1659   WBC 6.9 09/01/2017 1722   RBC 5.22 06/21/2019 1659   RBC 4.54 09/01/2017 1722   HGB 15.7 06/21/2019 1659   HCT 47.4 (H) 06/21/2019 1659   PLT 393 06/21/2019 1659   MCV 91 06/21/2019 1659   MCV 88 03/22/2013 0320   MCH 30.1 06/21/2019 1659   MCH 29.1 09/01/2017 1722   MCHC 33.1 06/21/2019 1659   MCHC 33.6 09/01/2017 1722   RDW 12.9 06/21/2019 1659   RDW 12.8 03/22/2013 0320   LYMPHSABS 4.0 (H) 06/21/2019 1659   EOSABS 0.6 (H) 06/21/2019 1659   BASOSABS 0.0 06/21/2019 1659    No results found for: HGBA1C  Assessment & Plan:  1. Anxiety and depression Stable Advised that benzodiazepines are not recommended for long-term management of anxiety and depression She understands this and would like to remain on Zoloft - sertraline (ZOLOFT) 100 MG tablet; Take 1 tablet (100 mg total) by mouth daily.  Dispense: 30 tablet; Refill: 6  2. Essential hypertension Controlled Counseled on blood pressure goal of less than 130/80, low-sodium, DASH diet, medication compliance, 150 minutes of moderate intensity exercise per week. Discussed medication compliance, adverse effects. - amLODipine (NORVASC) 10 MG tablet; Take 1 tablet (10 mg total) by mouth daily.  Dispense: 30 tablet; Refill: 6 - carvedilol (COREG) 25 MG  tablet; Take 1 tablet (25 mg total) by mouth 2 (two) times daily with a meal.  Dispense: 60 tablet; Refill: 6 - Basic Metabolic Panel  3. Poor concentration - Ambulatory referral to Psychiatry  4. Need for immunization against influenza - Flu Vaccine QUAD 4256mo+IM (Fluarix, Fluzone & Alfiuria Quad PF)  5. Mild intermittent asthma without complication Stable with no exacerbations - albuterol (VENTOLIN HFA) 108 (90 Base) MCG/ACT inhaler; Inhale 1-2 puffs into the lungs every 6 (six) hours as needed for wheezing or shortness of breath.  Dispense: 18 g; Refill: 0    Meds ordered this encounter  Medications  . sertraline (ZOLOFT) 100 MG tablet    Sig: Take 1 tablet (100 mg total) by mouth daily.    Dispense:  30 tablet    Refill:  6  . amLODipine (NORVASC) 10 MG tablet    Sig: Take 1 tablet (10 mg total) by mouth daily.    Dispense:  30 tablet    Refill:  6  . carvedilol (COREG) 25 MG tablet    Sig: Take 1 tablet (25 mg total) by mouth 2 (two) times daily with a meal.    Dispense:  60 tablet    Refill:  6  . albuterol (VENTOLIN HFA) 108 (90 Base) MCG/ACT inhaler    Sig: Inhale 1-2 puffs into the lungs every 6 (six) hours as needed for wheezing or shortness of breath.    Dispense:  18 g    Refill:  0    Follow-up: Return in about 6 months (around 02/02/2021) for chronic disease management.       Hoy RegisterEnobong Kennon Encinas, MD, FAAFP. [facility] [place], [place]  [phone]   08/02/2020, 12:26 PM

## 2020-08-02 NOTE — Progress Notes (Signed)
BP follow up only. Refills on medications.

## 2020-08-03 LAB — BASIC METABOLIC PANEL
BUN/Creatinine Ratio: 11 (ref 9–23)
BUN: 8 mg/dL (ref 6–20)
CO2: 19 mmol/L — ABNORMAL LOW (ref 20–29)
Calcium: 9.6 mg/dL (ref 8.7–10.2)
Chloride: 104 mmol/L (ref 96–106)
Creatinine, Ser: 0.71 mg/dL (ref 0.57–1.00)
Glucose: 104 mg/dL — ABNORMAL HIGH (ref 65–99)
Potassium: 4.3 mmol/L (ref 3.5–5.2)
Sodium: 140 mmol/L (ref 134–144)
eGFR: 113 mL/min/{1.73_m2} (ref >59)

## 2020-08-29 ENCOUNTER — Other Ambulatory Visit: Payer: Self-pay

## 2020-08-29 ENCOUNTER — Other Ambulatory Visit: Payer: Self-pay | Admitting: Family Medicine

## 2020-09-01 ENCOUNTER — Ambulatory Visit (HOSPITAL_COMMUNITY)
Admission: RE | Admit: 2020-09-01 | Discharge: 2020-09-01 | Disposition: A | Discharge status: Home / Self Care | Payer: Medicaid Other | Source: Ambulatory Visit | Attending: Student | Admitting: Student

## 2020-09-01 ENCOUNTER — Encounter (HOSPITAL_COMMUNITY): Payer: Self-pay

## 2020-09-01 ENCOUNTER — Other Ambulatory Visit: Payer: Self-pay

## 2020-09-01 VITALS — BP 150/110 | HR 73 | Temp 98.9°F | Resp 19

## 2020-09-01 DIAGNOSIS — S91331A Puncture wound without foreign body, right foot, initial encounter: Secondary | ICD-10-CM | POA: Diagnosis not present

## 2020-09-01 DIAGNOSIS — Z23 Encounter for immunization: Secondary | ICD-10-CM | POA: Diagnosis not present

## 2020-09-01 MED ORDER — TETANUS-DIPHTH-ACELL PERTUSSIS 5-2.5-18.5 LF-MCG/0.5 IM SUSY
0.5000 mL | PREFILLED_SYRINGE | Freq: Once | INTRAMUSCULAR | Status: AC
  Administered 2020-09-01: 0.5 mL via INTRAMUSCULAR

## 2020-09-01 MED ORDER — AMOXICILLIN-POT CLAVULANATE 875-125 MG PO TABS: 1.0000 | ORAL_TABLET | Freq: Two times a day (BID) | ORAL | 0 refills | Status: DC

## 2020-09-01 MED ORDER — AMOXICILLIN-POT CLAVULANATE 875-125 MG PO TABS
1.0000 | ORAL_TABLET | Freq: Two times a day (BID) | ORAL | 0 refills | Status: DC
  Filled 2020-09-01: qty 14, 7d supply, fill #0

## 2020-09-01 MED ORDER — TETANUS-DIPHTH-ACELL PERTUSSIS 5-2.5-18.5 LF-MCG/0.5 IM SUSY
PREFILLED_SYRINGE | INTRAMUSCULAR | Status: AC
  Filled 2020-09-01: qty 0.5

## 2020-09-01 NOTE — ED Provider Notes (Signed)
[facility]    CSN: [id number] Arrival date & time: 09/01/20  1400      History   Chief Complaint Chief Complaint  Patient presents with  . Foot Injury  . Appt @ 4    HPI Kelli Wells is a 35 y.o. female presenting with right foot injury since stepping on metal shower hook.  States she stepped on a shower hook 4 days ago, bottom right foot.  Was able to pull this out and states the entire hook came out.  States that the wound seems to be healing, but she noticed a bit of clear discharge and is concerned about this.  Ambulating but pain at area of laceration.  Denies fever/chills, numbness/tingling, injury elsewhere.  HPI  Past Medical History:  Diagnosis Date  . Anxiety   . Asthma   . Depression   . GERD (gastroesophageal reflux disease)   . Hypertension   . Infection    UTI  . Pregnancy induced hypertension   . Vaginal Pap smear, abnormal    LEEP, normal since    Patient Active Problem List   Diagnosis Date Noted  . Asthma   . Hypertension 12/14/2015  . Anxiety and depression 12/14/2015  . Cholecystitis with cholelithiasis 12/17/2014  . Pregnancy 05/22/2014  . Postpartum state 05/12/2014  . Chronic hypertension in pregnancy   . [redacted] weeks gestation of pregnancy   . Non-reactive NST (non-stress test)   . [redacted] weeks gestation of pregnancy     Past Surgical History:  Procedure Laterality Date  . CHOLECYSTECTOMY N/A 12/18/2014   Procedure: LAPAROSCOPIC CHOLECYSTECTOMY WITH INTRAOPERATIVE CHOLANGIOGRAM;  Surgeon: Chevis Pretty III, MD;  Location: [facility] OR;  Service: General;  Laterality: N/A;  . LEEP    . LYMPH GLAND EXCISION      OB History    Gravida  2   Para  2   Term  2   Preterm  0   AB  0   Living  2     SAB  0   IAB  0   Ectopic  0   Multiple  0   Live Births  2            Home Medications    Prior to Admission medications   Medication Sig Start Date End Date Taking? Authorizing Provider  amoxicillin-clavulanate  (AUGMENTIN) 875-125 MG tablet Take 1 tablet by mouth every 12 (twelve) hours. 09/01/20  Yes Rhys Martini, PA-C  amoxicillin-clavulanate (AUGMENTIN) 875-125 MG tablet Take 1 tablet by mouth every 12 (twelve) hours. 09/01/20  Yes Rhys Martini, PA-C  albuterol (VENTOLIN HFA) 108 (90 Base) MCG/ACT inhaler Inhale 1-2 puffs into the lungs every 6 (six) hours as needed for wheezing or shortness of breath. 08/02/20   Hoy Register, MD  amLODipine (NORVASC) 10 MG tablet Take 1 tablet (10 mg total) by mouth daily. 08/02/20   Hoy Register, MD  benzonatate (TESSALON) 100 MG capsule TAKE 2 CAPSULES BY MOUTH THREE TIMES DAILY AS NEEDED FOR COUGH 03/06/20 03/06/21  Wieters, Hallie C, PA-C  carvedilol (COREG) 25 MG tablet Take 1 tablet (25 mg total) by mouth 2 (two) times daily with a meal. 08/02/20   Hoy Register, MD  clonazePAM (KLONOPIN) 0.5 MG tablet Take 1 tablet (0.5 mg total) by mouth daily as needed for up to 2 days for anxiety. 03/09/20 03/11/20  Rema Fendt, NP  dextromethorphan-guaiFENesin (MUCINEX DM) 30-600 MG 12hr tablet Take 1 tablet by mouth 2 (two) times daily.  03/06/20   Wieters, Hallie C, PA-C  EPINEPHrine (EPIPEN 2-PAK) 0.3 mg/0.3 mL IJ SOAJ injection Inject 0.3 mLs (0.3 mg total) into the muscle as needed for anaphylaxis. 09/08/19   Zadie Rhine, MD  fluticasone (FLONASE) 50 MCG/ACT nasal spray Place 2 sprays into both nostrils daily. 06/21/19   Hoy Register, MD  HYDROcodone-homatropine (HYCODAN) 5-1.5 MG/5ML syrup Take 5 mLs by mouth every 6 (six) hours as needed for cough. 09/22/19   Mardella Layman, MD  ipratropium-albuterol (DUONEB) 0.5-2.5 (3) MG/3ML SOLN Take 3 mLs by nebulization every 6 (six) hours as needed. 03/06/20   Wieters, Hallie C, PA-C  ipratropium-albuterol (DUONEB) 0.5-2.5 (3) MG/3ML SOLN USE 1 AMPULE IN NEBULIZER VIA NEBULIZATION EVERY 6 HOURS AS NEEDED 03/06/20 03/06/21  Wieters, Hallie C, PA-C  loratadine (CLARITIN) 10 MG tablet Take 1 tablet (10 mg total) by  mouth daily. 10/20/18   Hoy Register, MD  methocarbamol (ROBAXIN) 500 MG tablet Take 1 tablet (500 mg total) by mouth every 8 (eight) hours as needed for muscle spasms. 09/29/18   Hoy Register, MD  naproxen (NAPROSYN) 500 MG tablet Take 1 tablet (500 mg total) by mouth 2 (two) times daily with a meal. 09/29/18   Hoy Register, MD  sertraline (ZOLOFT) 100 MG tablet Take 1 tablet (100 mg total) by mouth daily. 08/02/20   Hoy Register, MD    Family History Family History  Problem Relation Age of Onset  . Hypertension Maternal Grandmother   . Cancer Maternal Grandmother        breast  . Hypertension Maternal Grandfather   . Heart attack Maternal Grandfather   . Kidney disease Paternal Grandmother   . Heart disease Paternal Grandfather   . Hypertension Mother   . Cancer Mother        rectal  . Hearing loss Neg Hx     Social History Social History   Tobacco Use  . Smoking status: Current Every Day Smoker    Packs/day: 0.25    Types: Cigarettes  . Smokeless tobacco: Never Used  . Tobacco comment: quit June 2015  Vaping Use  . Vaping Use: Never used  Substance Use Topics  . Alcohol use: Yes    Alcohol/week: 2.0 - 3.0 standard drinks    Types: 2 - 3 Glasses of wine per week    Comment: occas  . Drug use: No     Allergies   Sulfa antibiotics and Shellfish allergy   Review of Systems Review of Systems  Skin: Positive for wound.  All other systems reviewed and are negative.    Physical Exam Triage Vital Signs ED Triage Vitals  Enc Vitals Group     BP 09/01/20 1438 (!) 150/110     Pulse Rate 09/01/20 1438 73     Resp 09/01/20 1438 19     Temp 09/01/20 1438 98.9 F (37.2 C)     Temp src --      SpO2 09/01/20 1438 100 %     Weight --      Height --      Head Circumference --      Peak Flow --      Pain Score 09/01/20 1436 5     Pain Loc --      Pain Edu? --      Excl. in GC? --    No data found.  Updated Vital Signs BP (!) 150/110   Pulse 73   Temp  98.9 F (37.2 C)   Resp 19  SpO2 100%   Visual Acuity Right Eye Distance:   Left Eye Distance:   Bilateral Distance:    Right Eye Near:   Left Eye Near:    Bilateral Near:     Physical Exam Vitals reviewed.  Constitutional:      General: She is not in acute distress.    Appearance: Normal appearance. She is not ill-appearing or diaphoretic.  HENT:     Head: Normocephalic and atraumatic.  Cardiovascular:     Rate and Rhythm: Normal rate and regular rhythm.     Heart sounds: Normal heart sounds.  Pulmonary:     Effort: Pulmonary effort is normal.     Breath sounds: Normal breath sounds.  Skin:    General: Skin is warm.     Capillary Refill: Capillary refill takes less than 2 seconds.     Comments: Bottom of right foot with small 1mm round puncture wound. Some surrounding tenderness but no effusion erythema or warmth. Sensation intact. No foreign body appreciated. Some serous discharge able to be expressed but no purulent discharge. Cap refill <2 seconds, DP 2+.  Neurological:     General: No focal deficit present.     Mental Status: She is alert and oriented to person, place, and time.  Psychiatric:        Mood and Affect: Mood normal.        Behavior: Behavior normal.        Thought Content: Thought content normal.        Judgment: Judgment normal.      UC Treatments / Results  Labs (all labs ordered are listed, but only abnormal results are displayed) Labs Reviewed - No data to display  EKG   Radiology No results found.  Procedures Procedures (including critical care time)  Medications Ordered in UC Medications  Tdap (BOOSTRIX) injection 0.5 mL (has no administration in time range)    Initial Impression / Assessment and Plan / UC Course  I have reviewed the triage vital signs and the nursing notes.  Pertinent labs & imaging results that were available during my care of the patient were reviewed by me and considered in my medical decision making (see  chart for details).     This patient is a 35 year old female presenting with puncture wound to bottom of right foot from shower curtain hook.  She is afebrile.  Wound appears to be healing well. Tdap UTD 2018 per chart review but patient is concerned this is not accurate and so Tdap administered today. Augmentin for infection prophylaxis.  I have very low suspicion for retained foreign body, discussed this with patient who is in agreement that we will defer x-ray.  She will follow-up if symptoms persist in about 5 to 7 days. ED return precautions discussed.   Final Clinical Impressions(s) / UC Diagnoses   Final diagnoses:  Puncture wound of right foot, initial encounter  Need for Tdap vaccination     Discharge Instructions     -Augmentin every 12 hours for 7 days.  You can take this with food if you have a sensitive stomach. -Wash the wound with gentle soap and water 1-2 times daily.  You can follow this with antibiotic ointment and a Band-Aid or Ace wrap. -Seek additional medical attention if you develop new symptoms like worsening discharge from the wound, fever/chills. -If your symptoms persist in about 1 week, come back and see us for an x-ray of the foot.    ED Prescriptions  Medication Sig Dispense Auth. Provider   amoxicillin-clavulanate (AUGMENTIN) 875-125 MG tablet Take 1 tablet by mouth every 12 (twelve) hours. 14 tablet Rhys Martini, PA-C   amoxicillin-clavulanate (AUGMENTIN) 875-125 MG tablet Take 1 tablet by mouth every 12 (twelve) hours. 14 tablet Rhys Martini, PA-C     PDMP not reviewed this encounter.   Rhys Martini, PA-C 09/01/20 1547

## 2020-09-01 NOTE — Discharge Instructions (Signed)
-  Augmentin every 12 hours for 7 days.  You can take this with food if you have a sensitive stomach. -Wash the wound with gentle soap and water 1-2 times daily.  You can follow this with antibiotic ointment and a Band-Aid or Ace wrap. -Seek additional medical attention if you develop new symptoms like worsening discharge from the wound, fever/chills. -If your symptoms persist in about 1 week, come back and see [place] for an x-ray of the foot.

## 2020-09-01 NOTE — ED Triage Notes (Signed)
Pt in with c/o right foot injury that occurred on Tuesday when she stepped on a metal shower hook   Pt states she has noticed a small amount of drainage drom the area  Denies any purulent drainage or redness to area

## 2020-09-04 ENCOUNTER — Other Ambulatory Visit: Payer: Self-pay

## 2020-09-06 ENCOUNTER — Other Ambulatory Visit: Payer: Self-pay

## 2020-09-11 ENCOUNTER — Other Ambulatory Visit: Payer: Self-pay

## 2020-12-07 ENCOUNTER — Encounter: Payer: Self-pay | Admitting: Family Medicine

## 2021-01-01 ENCOUNTER — Telehealth: Payer: Self-pay | Admitting: Family Medicine

## 2021-01-01 NOTE — Telephone Encounter (Signed)
Pt is calling to see if there is a cancellation today could she be seen in office for a follow up. Pt is out of work bc her daughter is sick.[phone]

## 2021-01-04 ENCOUNTER — Ambulatory Visit: Payer: Medicaid Other | Admitting: Physician Assistant

## 2021-01-09 ENCOUNTER — Encounter: Payer: Self-pay | Admitting: Family Medicine

## 2021-01-09 ENCOUNTER — Other Ambulatory Visit: Payer: Self-pay

## 2021-01-09 ENCOUNTER — Ambulatory Visit: Payer: 59 | Admitting: Physician Assistant

## 2021-01-09 VITALS — BP 147/90 | HR 84 | Temp 98.2°F | Resp 18 | Ht 62.0 in | Wt 191.0 lb

## 2021-01-09 DIAGNOSIS — E559 Vitamin D deficiency, unspecified: Secondary | ICD-10-CM

## 2021-01-09 DIAGNOSIS — O169 Unspecified maternal hypertension, unspecified trimester: Secondary | ICD-10-CM | POA: Insufficient documentation

## 2021-01-09 DIAGNOSIS — F5104 Psychophysiologic insomnia: Secondary | ICD-10-CM | POA: Diagnosis not present

## 2021-01-09 DIAGNOSIS — F419 Anxiety disorder, unspecified: Secondary | ICD-10-CM

## 2021-01-09 DIAGNOSIS — F32A Depression, unspecified: Secondary | ICD-10-CM

## 2021-01-09 DIAGNOSIS — R87619 Unspecified abnormal cytological findings in specimens from cervix uteri: Secondary | ICD-10-CM | POA: Insufficient documentation

## 2021-01-09 DIAGNOSIS — F439 Reaction to severe stress, unspecified: Secondary | ICD-10-CM | POA: Diagnosis not present

## 2021-01-09 MED ORDER — HYDROXYZINE HCL 10 MG PO TABS: 10.0000 mg | ORAL_TABLET | Freq: Three times a day (TID) | ORAL | 0 refills | Status: DC | PRN

## 2021-01-09 MED ORDER — TRAZODONE HCL 50 MG PO TABS: 25.0000 mg | ORAL_TABLET | Freq: Every evening | ORAL | 3 refills | Status: DC | PRN
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
## 2021-01-09 NOTE — Progress Notes (Signed)
Established Patient Office Visit  Subjective:  Patient ID: Kelli Wells, female    DOB: 1986/04/04  Age: 35 y.o. MRN: [id number]  CC:  Chief Complaint  Patient presents with   Anxiety    HPI [name] states that she has been having elevated anxiety despite taking 100 mg of Zoloft on a daily basis.  Reports that she has been on this medication for "a while".  Reports that she was started on Wellbutrin 1 week ago.  States that she has noticed a slight improvement since starting the Wellbutrin.  States that she has been feeling heart palpitations, racing heart.  Reports elevated anxiety and heart palpitations, tachycardia was present prior to starting the Wellbutrin.  Reports that she has increased stressors at work.  Reports that she is having difficulty falling asleep is only sleeping approximately 6 hours a day.  Has not tried anything for relief.  Reports that she has been having poor food choices, states that she is only eating once a day and generally not making good choices.    Past Medical History:  Diagnosis Date   Anxiety    Asthma    Chronic hypertension in pregnancy    Depression    GERD (gastroesophageal reflux disease)    Hypertension    Infection    UTI   Pregnancy induced hypertension    Vaginal Pap smear, abnormal    LEEP, normal since    Past Surgical History:  Procedure Laterality Date   CHOLECYSTECTOMY N/A 12/18/2014   Procedure: LAPAROSCOPIC CHOLECYSTECTOMY WITH INTRAOPERATIVE CHOLANGIOGRAM;  Surgeon: Autumn Messing III, MD;  Location: [facility] OR;  Service: General;  Laterality: N/A;   LEEP     LYMPH GLAND EXCISION      Family History  Problem Relation Age of Onset   Hypertension Maternal Grandmother    Cancer Maternal Grandmother        breast   Hypertension Maternal Grandfather    Heart attack Maternal Grandfather    Kidney disease Paternal Grandmother    Heart disease Paternal Grandfather    Hypertension Mother    Cancer  Mother        rectal   Hearing loss Neg Hx     Social History   Socioeconomic History   Marital status: Significant Other    Spouse name: Not on file   Number of children: Not on file   Years of education: Not on file   Highest education level: Some college, no degree  Occupational History   Not on file  Tobacco Use   Smoking status: Every Day    Packs/day: 0.25    Types: Cigarettes   Smokeless tobacco: Never   Tobacco comments:    quit June 2015  Vaping Use   Vaping Use: Never used  Substance and Sexual Activity   Alcohol use: Yes    Alcohol/week: 2.0 - 3.0 standard drinks    Types: 2 - 3 Glasses of wine per week    Comment: occas   Drug use: No   Sexual activity: Yes    Birth control/protection: Implant  Other Topics Concern   Not on file  Social History Narrative   ** Merged History Encounter **       Social Determinants of Health   Financial Resource Strain: Not on file  Food Insecurity: Not on file  Transportation Needs: Not on file  Physical Activity: Not on file  Stress: Not on file  Social Connections: Not on file  Intimate Partner  Violence: Not on file    Outpatient Medications Prior to Visit  Medication Sig Dispense Refill   albuterol (VENTOLIN HFA) 108 (90 Base) MCG/ACT inhaler Inhale 1-2 puffs into the lungs every 6 (six) hours as needed for wheezing or shortness of breath. 18 g 0   amLODipine (NORVASC) 10 MG tablet Take 1 tablet (10 mg total) by mouth daily. 30 tablet 6   buPROPion (WELLBUTRIN XL) 150 MG 24 hr tablet Take 150 mg by mouth every morning.     carvedilol (COREG) 25 MG tablet Take 1 tablet (25 mg total) by mouth 2 (two) times daily with a meal. 60 tablet 6   EPINEPHrine (EPIPEN 2-PAK) 0.3 mg/0.3 mL IJ SOAJ injection Inject 0.3 mLs (0.3 mg total) into the muscle as needed for anaphylaxis. 1 each 1   fluticasone (FLONASE) 50 MCG/ACT nasal spray Place 2 sprays into both nostrils daily. 16 g 1   ipratropium-albuterol (DUONEB) 0.5-2.5 (3)  MG/3ML SOLN Take 3 mLs by nebulization every 6 (six) hours as needed. 120 mL 0   ipratropium-albuterol (DUONEB) 0.5-2.5 (3) MG/3ML SOLN USE 1 AMPULE IN NEBULIZER VIA NEBULIZATION EVERY 6 HOURS AS NEEDED 360 mL 0   loratadine (CLARITIN) 10 MG tablet Take 1 tablet (10 mg total) by mouth daily. 30 tablet 1   methocarbamol (ROBAXIN) 500 MG tablet Take 1 tablet (500 mg total) by mouth every 8 (eight) hours as needed for muscle spasms. 90 tablet 1   naproxen (NAPROSYN) 500 MG tablet Take 1 tablet (500 mg total) by mouth 2 (two) times daily with a meal. 60 tablet 6   ranitidine (ZANTAC) 150 MG tablet Take 150 mg by mouth.     sertraline (ZOLOFT) 100 MG tablet Take 1 tablet (100 mg total) by mouth daily. 30 tablet 6   amoxicillin-clavulanate (AUGMENTIN) 875-125 MG tablet Take 1 tablet by mouth every 12 (twelve) hours. 14 tablet 0   amoxicillin-clavulanate (AUGMENTIN) 875-125 MG tablet Take 1 tablet by mouth every 12 (twelve) hours. 14 tablet 0   benzonatate (TESSALON) 100 MG capsule TAKE 2 CAPSULES BY MOUTH THREE TIMES DAILY AS NEEDED FOR COUGH 56 capsule 0   clonazePAM (KLONOPIN) 0.5 MG tablet Take 1 tablet (0.5 mg total) by mouth daily as needed for up to 2 days for anxiety. 2 tablet 0   dextromethorphan-guaiFENesin (MUCINEX DM) 30-600 MG 12hr tablet Take 1 tablet by mouth 2 (two) times daily. 20 tablet 0   HYDROcodone-homatropine (HYCODAN) 5-1.5 MG/5ML syrup Take 5 mLs by mouth every 6 (six) hours as needed for cough. 90 mL 0   No facility-administered medications prior to visit.    Allergies  Allergen Reactions   Sulfa Antibiotics Anaphylaxis and Swelling   Shellfish Allergy Swelling    Facial swelling     ROS Review of Systems  Constitutional: Negative.   HENT: Negative.    Eyes: Negative.   Respiratory:  Negative for shortness of breath.   Cardiovascular:  Positive for palpitations. Negative for chest pain.  Gastrointestinal: Negative.   Endocrine: Negative.   Genitourinary: Negative.    Musculoskeletal: Negative.   Skin: Negative.   Allergic/Immunologic: Negative.   Neurological: Negative.   Hematological: Negative.   Psychiatric/Behavioral:  Positive for dysphoric mood and sleep disturbance. Negative for self-injury and suicidal ideas. The patient is nervous/anxious.      Objective:    Physical Exam Vitals and nursing note reviewed.  Constitutional:      Appearance: Normal appearance.  HENT:     Head: Normocephalic and atraumatic.  Right Ear: External ear normal.     Left Ear: External ear normal.     Nose: Nose normal.     Mouth/Throat:     Mouth: Mucous membranes are moist.     Pharynx: Oropharynx is clear.  Eyes:     Extraocular Movements: Extraocular movements intact.     Conjunctiva/sclera: Conjunctivae normal.     Pupils: Pupils are equal, round, and reactive to light.  Cardiovascular:     Rate and Rhythm: Normal rate.     Pulses: Normal pulses.     Heart sounds: Normal heart sounds.  Pulmonary:     Effort: Pulmonary effort is normal.     Breath sounds: Normal breath sounds.  Musculoskeletal:        General: Normal range of motion.     Cervical back: Normal range of motion and neck supple.  Skin:    General: Skin is warm and dry.  Neurological:     General: No focal deficit present.     Mental Status: She is alert and oriented to person, place, and time.  Psychiatric:        Attention and Perception: Attention normal.        Mood and Affect: Mood is anxious. Affect is tearful.        Speech: Speech normal.        Behavior: Behavior normal.        Thought Content: Thought content normal.        Cognition and Memory: Cognition and memory normal.        Judgment: Judgment normal.    BP (!) 147/90 (BP Location: Left Arm, Patient Position: Sitting, Cuff Size: Normal)   Pulse 84   Temp 98.2 F (36.8 C) (Oral)   Resp 18   Ht _0  (1.575 m)   Wt 191 lb (86.6 kg)   SpO2 99%   BMI 34.93 kg/m  Wt Readings from Last 3 Encounters:   01/09/21 191 lb (86.6 kg)  08/02/20 190 lb (86.2 kg)  11/16/19 181 lb 14.4 oz (82.5 kg)     Health Maintenance Due  Topic Date Due   COVID-19 Vaccine (1) Never done   Pneumococcal Vaccine 70-64 Years old (1 - PCV) Never done   Hepatitis C Screening  Never done   INFLUENZA VACCINE  12/11/2020    There are no preventive care reminders to display for this patient.  Lab Results  Component Value Date   TSH 1.170 01/09/2021   Lab Results  Component Value Date   WBC 8.1 06/21/2019   HGB 15.7 06/21/2019   HCT 47.4 (H) 06/21/2019   MCV 91 06/21/2019   PLT 393 06/21/2019   Lab Results  Component Value Date   NA 140 08/02/2020   K 4.3 08/02/2020   CO2 19 (L) 08/02/2020   GLUCOSE 104 (H) 08/02/2020   BUN 8 08/02/2020   CREATININE 0.71 08/02/2020   BILITOT 0.2 06/21/2019   ALKPHOS 104 06/21/2019   AST 9 06/21/2019   ALT 11 06/21/2019   PROT 6.8 06/21/2019   ALBUMIN 4.3 06/21/2019   CALCIUM 9.6 08/02/2020   ANIONGAP 9 09/01/2017   EGFR 113 08/02/2020   Lab Results  Component Value Date   CHOL 191 09/26/2017   Lab Results  Component Value Date   HDL 61 09/26/2017   Lab Results  Component Value Date   LDLCALC 121 (H) 09/26/2017   Lab Results  Component Value Date   TRIG 47 09/26/2017   Lab  Results  Component Value Date   CHOLHDL 3.1 09/26/2017   No results found for: HGBA1C    Assessment & Plan:   Problem List Items Addressed This Visit       Other   Anxiety and depression - Primary   Relevant Medications   buPROPion (WELLBUTRIN XL) 150 MG 24 hr tablet   hydrOXYzine (ATARAX/VISTARIL) 10 MG tablet   traZODone (DESYREL) 50 MG tablet   Other Relevant Orders   Ambulatory referral to Social Work   Vitamin D, 25-hydroxy (Completed)   TSH (Completed)   Stress   Relevant Orders   Ambulatory referral to Social Work   Psychophysiological insomnia   Relevant Medications   traZODone (DESYREL) 50 MG tablet    Meds ordered this encounter  Medications    hydrOXYzine (ATARAX/VISTARIL) 10 MG tablet    Sig: Take 1 tablet (10 mg total) by mouth 3 (three) times daily as needed.    Dispense:  60 tablet    Refill:  0    Order Specific Question:   Supervising Provider    Answer:   Elsie Stain [[id number]]   traZODone (DESYREL) 50 MG tablet    Sig: Take 0.5-1 tablets (25-50 mg total) by mouth at bedtime as needed for sleep.    Dispense:  30 tablet    Refill:  3    Order Specific Question:   Supervising Provider    Answer:   Joya Gaskins, PATRICK E [[id number]]   1. Anxiety and depression Trial hydroxyzine, patient agreeable to referral for CBT.  Patient education given on lifestyle modifications, coping skills.  Red flags given for prompt reevaluation. - Ambulatory referral to Social Work - Vitamin D, 25-hydroxy - TSH - hydrOXYzine (ATARAX/VISTARIL) 10 MG tablet; Take 1 tablet (10 mg total) by mouth 3 (three) times daily as needed.  Dispense: 60 tablet; Refill: 0  2. Stress  - Ambulatory referral to Social Work  3. Psychophysiological insomnia Trial of trazodone, patient education given on good sleep hygiene - traZODone (DESYREL) 50 MG tablet; Take 0.5-1 tablets (25-50 mg total) by mouth at bedtime as needed for sleep.  Dispense: 30 tablet; Refill: 3   I have reviewed the patient's medical history (PMH, PSH, Social History, Family History, Medications, and allergies) , and have been updated if relevant. I spent 37 minutes reviewing chart and  face to face time with patient.    Follow-up: Return if symptoms worsen or fail to improve.    Loraine Grip Mayers, PA-C

## 2021-01-09 NOTE — Patient Instructions (Signed)
To help with your insomnia, you are going to start using trazodone 25 to 50 mg at night.  To help with your anxiety during the day, continue taking your Zoloft and Wellbutrin, but I do encourage you to use hydroxyzine 10 mg every 8 hours as needed as well.  I encourage you to increase your water intake, work on a low sugar diet and get lots of rest.  I have started a referral for you to be [facility]  We will call you with today's lab results.  Roney Jaffe, PA-C Physician Assistant [facility] [email or web address]   Managing Anxiety, Adult After being diagnosed with an anxiety disorder, you may be relieved to know why you have felt or behaved a certain way. You may also feel overwhelmed about the treatment ahead and what it will mean for your life. With care and support, youcan manage this condition and recover from it. How to manage lifestyle changes Managing stress and anxiety  Stress is your body's reaction to life changes and events, both good and bad. Most stress will last just a few hours, but stress can be ongoing and can lead to more than just stress. Although stress can play a major role in anxiety, it is not the same as anxiety. Stress is usually caused by something external, such as a deadline, test, or competition. Stress normally passes after thetriggering event has ended.  Anxiety is caused by something internal, such as imagining a terrible outcome or worrying that something will go wrong that will devastate you. Anxiety often does not go away even after the triggering event is over, and it can become long-term (chronic) worry. It is important to understand the differences between stress and anxiety and to manage your stress effectively so that it does not lead to ananxious response. Talk with your health care provider or a counselor to learn more about reducing  anxiety and stress. He or she may suggest tension reduction techniques, such as: Music therapy. This can include creating or listening to music that you enjoy and that inspires you. Mindfulness-based meditation. This involves being aware of your normal breaths while not trying to control your breathing. It can be done while sitting or walking. Centering prayer. This involves focusing on a word, phrase, or sacred image that means something to you and brings you peace. Deep breathing. To do this, expand your stomach and inhale slowly through your nose. Hold your breath for 3-5 seconds. Then exhale slowly, letting your stomach muscles relax. Self-talk. This involves identifying thought patterns that lead to anxiety reactions and changing those patterns. Muscle relaxation. This involves tensing muscles and then relaxing them. Choose a tension reduction technique that suits your lifestyle and personality. These techniques take time and practice. Set aside 5-15 minutes a day to do them. Therapists can offer counseling and training in these techniques. The training to help with anxiety may be covered by some insurance plans. Other things you can do to manage stress and anxiety include: Keeping a stress/anxiety diary. This can help you learn what triggers your reaction and then learn ways to manage your response. Thinking about how you react to certain situations. You may not be able to control everything, but you can control your response. Making time for activities that help you relax and not feeling guilty about spending your time in this way. Visual imagery and yoga can help you stay calm and relax.  Medicines Medicines  can help ease symptoms. Medicines for anxiety include: Anti-anxiety drugs. Antidepressants. Medicines are often used as a primary treatment for anxiety disorder. Medicines will be prescribed by a health care provider. When used together, medicines, psychotherapy, and tension reduction  techniques may be the most effectivetreatment. Relationships Relationships can play a big part in helping you recover. Try to spend more time connecting with trusted friends and family members. Consider going to couples counseling, taking family education classes, or going to familytherapy. Therapy can help you and others better understand your condition. How to recognize changes in your anxiety Everyone responds differently to treatment for anxiety. Recovery from anxiety happens when symptoms decrease and stop interfering with your daily activities at home or work. This may mean that you will start to: Have better concentration and focus. Worry will interfere less in your daily thinking. Sleep better. Be less irritable. Have more energy. Have improved memory. It is important to recognize when your condition is getting worse. Contact your health care provider if your symptoms interfere with home or work and you feellike your condition is not improving. Follow these instructions at home: Activity Exercise. Most adults should do the following: Exercise for at least 150 minutes each week. The exercise should increase your heart rate and make you sweat (moderate-intensity exercise). Strengthening exercises at least twice a week. Get the right amount and quality of sleep. Most adults need 7-9 hours of sleep each night. Lifestyle  Eat a healthy diet that includes plenty of vegetables, fruits, whole grains, low-fat dairy products, and lean protein. Do not eat a lot of foods that are high in solid fats, added sugars, or salt. Make choices that simplify your life. Do not use any products that contain nicotine or tobacco, such as cigarettes, e-cigarettes, and chewing tobacco. If you need help quitting, ask your health care provider. Avoid caffeine, alcohol, and certain over-the-counter cold medicines. These may make you feel worse. Ask your pharmacist which medicines to avoid.  General  instructions Take over-the-counter and prescription medicines only as told by your health care provider. Keep all follow-up visits as told by your health care provider. This is important. Where to find support You can get help and support from these sources: Self-help groups. Online and [employer]. A trusted spiritual leader. Couples counseling. Family education classes. Family therapy. Where to find more information You may find that joining a support group helps you deal with your anxiety. The following sources can help you locate counselors or support groups near you: Mental Health America: www.mentalhealthamerica.net Anxiety and Depression Association of [place] (ADAA): ProgramCam.de [employer] on Mental Illness (NAMI): www.nami.org Contact a health care provider if you: Have a hard time staying focused or finishing daily tasks. Spend many hours a day feeling worried about everyday life. Become exhausted by worry. Start to have headaches, feel tense, or have nausea. Urinate more than normal. Have diarrhea. Get help right away if you have: A racing heart and shortness of breath. Thoughts of hurting yourself or others. If you ever feel like you may hurt yourself or others, or have thoughts about taking your own life, get help right away. You can go to your nearest emergency department or call: Your local emergency services (911 in the [place] A suicide crisis helpline, such as the National Suicide Prevention Lifeline at 9564013227. This is open 24 hours a day. Summary Taking steps to learn and use tension reduction techniques can help calm you and help prevent triggering an anxiety reaction. When used together,  medicines, psychotherapy, and tension reduction techniques may be the most effective treatment. Family, friends, and partners can play a big part in helping you recover from an anxiety disorder. This information is not intended to replace advice given  to you by your health care provider. Make sure you discuss any questions you have with your healthcare provider. Document Revised: 09/29/2018 Document Reviewed: 09/29/2018 Elsevier Patient Education  2022 [employer].

## 2021-01-10 DIAGNOSIS — F5104 Psychophysiologic insomnia: Secondary | ICD-10-CM | POA: Insufficient documentation

## 2021-01-10 DIAGNOSIS — F439 Reaction to severe stress, unspecified: Secondary | ICD-10-CM | POA: Insufficient documentation

## 2021-01-10 LAB — TSH: TSH: 1.17 u[IU]/mL (ref 0.450–4.500)

## 2021-01-10 LAB — VITAMIN D 25 HYDROXY (VIT D DEFICIENCY, FRACTURES): Vit D, 25-Hydroxy: 9.3 ng/mL — ABNORMAL LOW (ref 30.0–100.0)

## 2021-01-10 MED ORDER — VITAMIN D (ERGOCALCIFEROL) 1.25 MG (50000 UNIT) PO CAPS: 50000.0000 [IU] | ORAL_CAPSULE | ORAL | 2 refills | Status: DC

## 2021-01-10 NOTE — Addendum Note (Signed)
Addended by: Roney Jaffe on: 01/10/2021 02:15 PM   Modules accepted: Orders

## 2021-01-16 ENCOUNTER — Telehealth: Payer: Self-pay | Admitting: Clinical

## 2021-01-17 ENCOUNTER — Telehealth: Payer: Self-pay | Admitting: *Deleted

## 2021-01-17 NOTE — Telephone Encounter (Signed)
-----   Message from Roney Jaffe, [place] sent at 01/10/2021  2:15 PM EDT ----- Please call patient and let her know that her thyroid function is within normal limits.  She does have very low vitamin D levels, she needs to take 50,000 units once a week for the next 12 weeks and have it rechecked at that time.  Prescription sent to her pharmacy.

## 2021-01-17 NOTE — Telephone Encounter (Signed)
Patient is aware of results via VM from signed DPR. Patient is also advised to contact social worker for follow up.

## 2021-01-22 NOTE — Telephone Encounter (Signed)
Spoke with pt on 01/19/21 and she mentioned that she had to look at her schedule to determine a good time to meet

## 2021-01-26 NOTE — Telephone Encounter (Signed)
Spoke with pt and she stated that she already has an appt scheduled for therapy and med management. Advised pt to call back if further support is needed.

## 2021-01-29 NOTE — Telephone Encounter (Signed)
Pt is calling to speak to [place]. Please advise Cb- [phone]

## 2021-02-05 ENCOUNTER — Telehealth: Payer: Self-pay | Admitting: Family Medicine

## 2021-02-05 NOTE — Telephone Encounter (Signed)
Patient would like a callback to set up appointment for counseling

## 2021-02-21 ENCOUNTER — Ambulatory Visit: Payer: Medicaid Other | Admitting: Family Medicine

## 2021-02-26 ENCOUNTER — Ambulatory Visit: Payer: 59 | Attending: Family Medicine | Admitting: Clinical

## 2021-02-26 ENCOUNTER — Other Ambulatory Visit: Payer: Self-pay

## 2021-02-26 DIAGNOSIS — F4323 Adjustment disorder with mixed anxiety and depressed mood: Secondary | ICD-10-CM | POA: Diagnosis not present

## 2021-03-01 ENCOUNTER — Ambulatory Visit: Payer: Self-pay | Admitting: *Deleted

## 2021-03-01 NOTE — Telephone Encounter (Signed)
I returned pt's call.   She had called in earlier c/o her BP being 155/102 and having some mild dizziness.  She was driving home from [place], [place] while I was talking with her.     She went to the dentist this morning to have some work done and they would not do the work because her BP was elevated.   They took 3 different readings.   155/102, 155/108, and 155/102 again.    She has not been taking her BP medication daily.    "My mother is in [facility] and I've been looking after her and not myself".   "I've just forgotten to take it".    "My last dose before today at 11:00 was day before yesterday".     "I hate they could not do the dental work today because I'm starting a new job Monday and can't take off for 90 days".    "But it's my fault for not taking my medication".     She does not have a headache today but last night she had a "mild headache".    "My vision was a little blurry this morning but after I ate something when I left the dental office it's much better now".  She took her dose of BP medication at 11:00 today.    I instructed her to check her BP at the local drug store, she doesn't have a BP machine, this afternoon after 2:00 and see if her BP has come down.   If it was 145/90 or higher to please given the office a call back.   She was agreeable to this plan.    I also encouraged her to check her BP daily if possible to see how it trends now that she is resuming her BP medication.   She was agreeable to this plan.   "I can check it at the [facility] near me".     She requested I go ahead and make her an appt for her 3 month check up because that would work out with her new job since she can't be off for 90 days starting Monday.   I made her an appt with Dr. Alvis Lemmings for 05/22/2020 at 3:30 in office visit.    "That will work perfect with my new job".    "I can take off then".    I sent my notes high priority to [facility] for Dr. Hoy Register.

## 2021-03-01 NOTE — Telephone Encounter (Signed)
Reason for Disposition  Ran out of BP medications    Pt has not been taking her BP medications as prescribed.   Missing some doses.  Answer Assessment - Initial Assessment Questions 1. BLOOD PRESSURE: "What is the blood pressure?" "Did you take at least two measurements 5 minutes apart?"     I'm not taking my BP medicine like I should.   The dental work wasn't done today because my BP was high today.    My Mom has been in [place] and I've been looking after her and forgot to take my BP medicines. 155/108, 155/102.    Took my BP medicine at 11:00 today.   My vision was a little blurry earlier but it's better.   No headache now.   Last night I had a mild headache. I'm driving from [facility] right now.    I start a new job Monday.     My last dose of BP medicine was day before yesterday. 2. ONSET: "When did you take your blood pressure?"     At the dentist this morning.    3. HOW: "How did you obtain the blood pressure?" (e.g., visiting nurse, automatic home BP monitor)     They took 3 separate readings. 4. HISTORY: "Do you have a history of high blood pressure?"     Yes 5. MEDICATIONS: "Are you taking any medications for blood pressure?" "Have you missed any doses recently?"     Yes   Yes 6. OTHER SYMPTOMS: "Do you have any symptoms?" (e.g., headache, chest pain, blurred vision, difficulty breathing, weakness)     Slight blurry vision that is better now, mild headache last night but not now. 7. PREGNANCY: "Is there any chance you are pregnant?" "When was your last menstrual period?"     Not asked  Protocols used: Blood Pressure - High-A-AH

## 2021-03-01 NOTE — Telephone Encounter (Signed)
If she has not been taking her medications consistently, no regimen changes need to be made at this time.  I would recommend she take her medications as prescribed daily and call the clinic back with blood pressure readings over 3 days.  Thanks

## 2021-03-02 NOTE — Telephone Encounter (Signed)
Spoke with patient who states she has enough refills and medication to last until next appt in Jan.   Advised of message per Dr. Alvis Lemmings.  Advised to purchase BP cuff to take readings at home. Informed that she could go to [employer] or PCP office for BP check. Patient advised on diet, sodium intake and increase water.  Patient verbalized understanding.

## 2021-03-05 ENCOUNTER — Other Ambulatory Visit: Payer: Self-pay

## 2021-03-05 ENCOUNTER — Other Ambulatory Visit: Payer: Self-pay | Admitting: Family Medicine

## 2021-03-05 DIAGNOSIS — F419 Anxiety disorder, unspecified: Secondary | ICD-10-CM

## 2021-03-05 DIAGNOSIS — I10 Essential (primary) hypertension: Secondary | ICD-10-CM

## 2021-03-05 DIAGNOSIS — F32A Depression, unspecified: Secondary | ICD-10-CM

## 2021-03-05 NOTE — Telephone Encounter (Signed)
Requested medications are due for refill today yes  Requested medications are on the active medication list yes  Last visit 08/02/20, asked to return in 6 months, Sept.  Future visit scheduled not until 05/22/21  Notes to clinic asked to return in Sept, appt not until Jan 2023, please assess.  Requested Prescriptions  Pending Prescriptions Disp Refills   sertraline (ZOLOFT) 100 MG tablet [Pharmacy Med Name: Sertraline HCl 100 MG Oral Tablet] 30 tablet 0    Sig: Take 1 tablet by mouth once daily     Psychiatry:  Antidepressants - SSRI Failed - 03/05/2021  4:37 PM      Failed - Valid encounter within last 6 months    Recent Outpatient Visits           7 months ago Poor concentration   [facility] Hoy Register, MD   12 months ago Anxiety and depression   [facility] [place], [place], [place]   1 year ago Angioedema, initial encounter   [facility] [place], Durene Fruits, MD   1 year ago Furuncle   [facility] [place], [place], [place]   1 year ago Paresthesia   [facility] [place], [place], [place]       Future Appointments             In 2 months Hoy Register, MD [facility]            Passed - Completed PHQ-2 or PHQ-9 in the last 360 days       amLODipine (NORVASC) 10 MG tablet [Pharmacy Med Name: amLODIPine Besylate 10 MG Oral Tablet] 30 tablet 0    Sig: Take 1 tablet by mouth once daily     Cardiovascular:  Calcium Channel Blockers Failed - 03/05/2021  4:37 PM      Failed - Last BP in normal range    BP Readings from Last 1 Encounters:  01/09/21 (!) 147/90          Failed - Valid encounter within last 6 months    Recent Outpatient Visits           7 months ago Poor concentration   [facility] Hoy Register, MD   12 months ago Anxiety and depression   [facility] [place], [place], [place]   1 year ago Angioedema, initial encounter   [facility] Debby Bud, MD   1 year ago Furuncle   [facility] Hoy Register, MD   1 year ago Paresthesia   [facility] [place], Marzella [place], [place]       Future Appointments             In 2 months Hoy Register, MD [facility]             carvedilol (COREG) 25 MG tablet [Pharmacy Med Name: Carvedilol 25 MG Oral Tablet] 60 tablet 0    Sig: TAKE 1 TABLET BY MOUTH TWICE DAILY WITH A MEAL     Cardiovascular:  Beta Blockers Failed - 03/05/2021  4:37 PM      Failed - Last BP in normal range    BP Readings from Last 1 Encounters:  01/09/21 (!) 147/90          Failed -  Valid encounter within last 6 months    Recent Outpatient Visits           7 months ago Poor concentration   [facility] Hoy Register, MD   12 months ago Anxiety and depression   [facility] [place], [place], NP   1 year ago Angioedema, initial encounter   [facility] Debby Bud, MD   1 year ago Furuncle   [facility] Hoy Register, MD   1 year ago Paresthesia   [facility] [place], Marzella [place], [place]       Future Appointments             In 2 months Hoy Register, MD [facility]            Passed - Last Heart Rate in normal range    Pulse Readings from Last 1 Encounters:  01/09/21 84

## 2021-03-07 NOTE — BH Specialist Note (Signed)
Integrated Behavioral Health via Telemedicine Visit  02/26/2021 Kelli Wells [id number]  Number of Integrated Behavioral Health visits: 1/6 Session Start time: 4:30pm  Session End time: 5:30pm Total time: 60  Referring Provider: Maurene Capes, PA Patient/Family location: Home [facility] Provider location: [facility] Office All persons participating in visit: Pt and LCSWA Types of Service: Individual psychotherapy and Video visit  I connected with [name] via Engineer, civil (consulting)  (Video is Caregility application) and verified that I am speaking with the correct person using two identifiers. Discussed confidentiality: Yes   I discussed the limitations of telemedicine and the availability of in person appointments.  Discussed there is a possibility of technology failure and discussed alternative modes of communication if that failure occurs.  I discussed that engaging in this telemedicine visit, they consent to the provision of behavioral healthcare and the services will be billed under their insurance.  Patient and/or legal guardian expressed understanding and consented to Telemedicine visit: Yes   Presenting Concerns: Patient and/or family reports the following symptoms/concerns: Reports feeling depressed, decreased interest in activities, decreased energy,  trouble sleeping, trouble concentrating, fidgeting, anxiousness, worrying, trouble relaxing, irritability, and restlessness. Reports that she recently got out of a relationship due to learning that he cheated on her and had another baby. Reports that she was in a relationship with him for several years and is having difficulty adjusting. Reports that he is the father of her youngest daughter. Reports that she is currently staying with her mother since leaving her ex-boyfriend. Reports that her youngest daughter has been having behavior problems at home and school. Reports that she also recently left her job  due to not being treated properly. Reports that she is starting a new job. Also reports that she was diagnosed with ADHD in the past.  Duration of problem: 4 months; Severity of problem: moderate  Patient and/or Family's Strengths/Protective Factors: Sense of purpose  Goals Addressed: Patient will:  Reduce symptoms of: anxiety and depression   Increase knowledge and/or ability of: coping skills and stress reduction   Demonstrate ability to: Increase healthy adjustment to current life circumstances  Progress towards Goals: Ongoing  Interventions: Interventions utilized:  Mindfulness or Management consultant, CBT Cognitive Behavioral Therapy, Supportive Counseling, and Psychoeducation and/or Health Education Standardized Assessments completed: ASRS, GAD-7, and PHQ 9 Depression screen [facility] 2/9 02/26/2021 01/09/2021 01/09/2021 08/02/2020 06/21/2019  Decreased Interest 2 3 0 1 1  Down, Depressed, Hopeless 2 3 0 3 1  PHQ - 2 Score 4 6 0 4 2  Altered sleeping 3 3 - 3 3  Tired, decreased energy 3 3 - 3 1  Change in appetite 2 3 - 3 3  Feeling bad or failure about yourself  2 3 - 2 1  Trouble concentrating 1 3 - 3 3  Moving slowly or fidgety/restless 3 3 - 2 1  Suicidal thoughts 0 3 - 0 0  PHQ-9 Score 18 27 - 20 14  Some recent data might be hidden    GAD 7 : Generalized Anxiety Score 02/26/2021 01/09/2021 08/02/2020 06/21/2019  Nervous, Anxious, on Edge 3 3 3 2   Control/stop worrying 3 3 3 2   Worry too much - different things 3 3 3 2   Trouble relaxing 3 3 3 2   Restless 3 3 3 3   Easily annoyed or irritable 2 3 3 3   Afraid - awful might happen 1 3 2 2   Total GAD 7 Score 18 21 20 16    Patient and/or Family  Response: Pt receptive to tx. Pt receptive to psycheoducation on depression and anxiety. Pt receptive to cognitive restructuring and assistance with cognitive processing skills. Pt receptive to parenting skills provided on punishment and reinforcement styles and will incorporate them with her  youngest daughter. Pt receptive to utilizing deep breathing exercises and having daily self-care.  Assessment: Denies SI/HI. Denies auditory/visual hallucinations. No safety risks. Patient currently experiencing depression and anxiety related to recent relationship ending, parenting issues, and recent work problems. Pt appears to experience cognitive processing problems. Pt appears to need assistance with parenting skills.   Patient may benefit from brief therapy. Pt is currently involved in medication management with provider. LCSWA provided psychoeducation on depression and anxiety. LCSWA utilized cognitive restructuring and assisted with cognitive processing skills. LCSWA provided assistance with parenting skills. LCSWA encouraged pt to incorporate daily self-care and utilize deep breathing exercises. LCSWA will fu with pt.  Plan: Follow up with behavioral health clinician on : 03/19/21 Behavioral recommendations: Utilize deep breathing exercises and incorporate daily self-care Referral(s): Integrated [employer] (In Clinic)  I discussed the assessment and treatment plan with the patient and/or parent/guardian. They were provided an opportunity to ask questions and all were answered. They agreed with the plan and demonstrated an understanding of the instructions.   They were advised to call back or seek an in-person evaluation if the symptoms worsen or if the condition fails to improve as anticipated.  Michai Dieppa C Kayci [place], LCSW

## 2021-03-16 ENCOUNTER — Telehealth (INDEPENDENT_AMBULATORY_CARE_PROVIDER_SITE_OTHER): Payer: Self-pay

## 2021-03-16 NOTE — Telephone Encounter (Signed)
Copied from CRM [id number]. Topic: General - Other >> Mar 14, 2021 12:45 PM Jaquita Rector A wrote: Reason for CRM: Patient called in to speak to the counselor Ms Massie Maroon  asking for a call back in reference to her appointment and possibly delaying her sessions. Can be reached at  Ph# [phone]

## 2021-03-19 ENCOUNTER — Ambulatory Visit: Payer: 59 | Admitting: Clinical

## 2021-03-21 ENCOUNTER — Ambulatory Visit: Payer: Medicaid Other | Admitting: Family Medicine

## 2021-03-29 NOTE — Telephone Encounter (Signed)
First attempt to reach pt, no answer, left vm.

## 2021-04-09 ENCOUNTER — Ambulatory Visit (HOSPITAL_BASED_OUTPATIENT_CLINIC_OR_DEPARTMENT_OTHER): Payer: Medicaid Other | Admitting: Clinical

## 2021-04-09 DIAGNOSIS — F4323 Adjustment disorder with mixed anxiety and depressed mood: Secondary | ICD-10-CM

## 2021-04-09 NOTE — Telephone Encounter (Signed)
Pt called in for assistance. She says that she had a visit today with Kelli Wells. Pt says that she need a work note with the  office letter head on it available to her. Pt would like to have it sent to her as an attachment in mychart so that she is able to print for employer.    Please assist pt further.

## 2021-04-10 ENCOUNTER — Telehealth: Payer: Medicaid Other | Admitting: Physician Assistant

## 2021-04-10 ENCOUNTER — Encounter: Payer: Self-pay | Admitting: Clinical

## 2021-04-10 DIAGNOSIS — K529 Noninfective gastroenteritis and colitis, unspecified: Secondary | ICD-10-CM

## 2021-04-10 MED ORDER — ONDANSETRON 4 MG PO TBDP: 4.0000 mg | ORAL_TABLET | Freq: Three times a day (TID) | ORAL | 0 refills | Status: DC | PRN

## 2021-04-10 NOTE — BH Specialist Note (Signed)
Integrated Behavioral Health via Telemedicine Visit  04/09/2021 Kelli Wells [id number]  Number of Integrated Behavioral Health visits: 2 Session Start time: 11:45am  Session End time: 12:37pm Total time:  52 minutes  Referring Provider: Maurene Capes, PA Patient/Family location: Home [facility] Provider location: [facility] Office All persons participating in visit: Pt and LCSWA Types of Service: Individual psychotherapy and Video visit  I connected with Kelli Wells via Engineer, civil (consulting)  (Video is Caregility application) and verified that I am speaking with the correct person using two identifiers. Discussed confidentiality: Yes   I discussed the limitations of telemedicine and the availability of in person appointments.  Discussed there is a possibility of technology failure and discussed alternative modes of communication if that failure occurs.  I discussed that engaging in this telemedicine visit, they consent to the provision of behavioral healthcare and the services will be billed under their insurance.  Patient and/or legal guardian expressed understanding and consented to Telemedicine visit: Yes   Presenting Concerns: Patient and/or family reports the following symptoms/concerns: Reports trouble sleeping, feeling depressed at times, decreased energy, trouble concentrating, fidgeting, anxiousness, worrying, restlessness, irritability, and trouble relaxing. Reports that she is continuing to process the end of her relationship with her daughter's father. Reports that she has been co-parenting and has noticed an improvement in er daughter's behavior. Reports experiencing self-esteem disturbances.  Duration of problem: 5 months; Severity of problem: moderate  Patient and/or Family's Strengths/Protective Factors: Sense of purpose  Goals Addressed: Patient will:  Reduce symptoms of: anxiety and depression   Increase knowledge and/or ability of: coping  skills and stress reduction   Demonstrate ability to: Increase healthy adjustment to current life circumstances  Progress towards Goals: Ongoing  Interventions: Interventions utilized:  CBT Cognitive Behavioral Therapy and Supportive Counseling Standardized Assessments completed: GAD-7 and PHQ 9 GAD 7 : Generalized Anxiety Score 04/09/2021 02/26/2021 01/09/2021 08/02/2020  Nervous, Anxious, on Edge 2 3 3 3   Control/stop worrying 2 3 3 3   Worry too much - different things 2 3 3 3   Trouble relaxing 2 3 3 3   Restless 2 3 3 3   Easily annoyed or irritable 2 2 3 3   Afraid - awful might happen 1 1 3 2   Total GAD 7 Score 13 18 21 20      Depression screen [facility] 2/9 04/09/2021 02/26/2021 01/09/2021 01/09/2021 08/02/2020  Decreased Interest 1 2 3  0 1  Down, Depressed, Hopeless 1 2 3  0 3  PHQ - 2 Score 2 4 6  0 4  Altered sleeping 3 3 3  - 3  Tired, decreased energy 2 3 3  - 3  Change in appetite 2 2 3  - 3  Feeling bad or failure about yourself  1 2 3  - 2  Trouble concentrating 2 1 3  - 3  Moving slowly or fidgety/restless 2 3 3  - 2  Suicidal thoughts 0 0 3 - 0  PHQ-9 Score 14 18 27  - 20  Some recent data might be hidden    Patient and/or Family Response: Pt receptive to tx. Pt receptive to psychoeducation provided on anxiety and self-esteem. Pt receptive to cognitive restructuring utilized to decrease unhelpful thoughts. Pt receptive to support provided as pt disclosed her concerns with her relationship with her daughter's father. Pt will establish healthy boundaries with her daughter's father and utilize deep breathing exercises.   Assessment: Denies SI/HI. Denies auditory/visual hallucinations. No safety risks. Patient currently experiencing depression and anxiety related to her relationship ending. Pt experiences self-esteem disturbances  and frequently questions her worth within her previous relationship. Pt also sees her daughter's father everyday due to him coming to her house and is having  difficulty getting over there relationship.  Patient may benefit from continued brief therapy and assistance with self-esteem. LCSWA provided psychoeducation on anxiety and self-esteem. LCSWA utilized cognitive restructuring to decrease negative and unhelpful thoughts. LCSWA encouraged pt to establish healthy boundaries with her daughter's father and utilize deep breathing exercises. LCSWA will fu with pt.   Plan: Follow up with behavioral health clinician on : 04/23/21 Behavioral recommendations: Establish healthy boundaries with daughter's father and utilize deep breathing exercises.  Referral(s): Integrated [employer] (In Clinic)  I discussed the assessment and treatment plan with the patient and/or parent/guardian. They were provided an opportunity to ask questions and all were answered. They agreed with the plan and demonstrated an understanding of the instructions.   They were advised to call back or seek an in-person evaluation if the symptoms worsen or if the condition fails to improve as anticipated.  Newton Frutiger C Samir Ishaq, LCSW

## 2021-04-10 NOTE — Patient Instructions (Signed)
[employer] [employer], thank you for joining [name], PA-C for today's virtual visit.  While this provider is not your primary care provider (PCP), if your PCP is located in our provider database this encounter information will be shared with them immediately following your visit.  Consent: (Patient) Kelli Wells provided verbal consent for this virtual visit at the beginning of the encounter.  Current Medications:  Current Outpatient Medications:    ondansetron (ZOFRAN-ODT) 4 MG disintegrating tablet, Take 1 tablet (4 mg total) by mouth every 8 (eight) hours as needed for nausea or vomiting., Disp: 20 tablet, Rfl: 0   albuterol (VENTOLIN HFA) 108 (90 Base) MCG/ACT inhaler, Inhale 1-2 puffs into the lungs every 6 (six) hours as needed for wheezing or shortness of breath., Disp: 18 g, Rfl: 0   amLODipine (NORVASC) 10 MG tablet, Take 1 tablet by mouth once daily, Disp: 30 tablet, Rfl: 2   buPROPion (WELLBUTRIN XL) 150 MG 24 hr tablet, Take 150 mg by mouth every morning., Disp: , Rfl:    carvedilol (COREG) 25 MG tablet, TAKE 1 TABLET BY MOUTH TWICE DAILY WITH A MEAL, Disp: 60 tablet, Rfl: 2   EPINEPHrine (EPIPEN 2-PAK) 0.3 mg/0.3 mL IJ SOAJ injection, Inject 0.3 mLs (0.3 mg total) into the muscle as needed for anaphylaxis., Disp: 1 each, Rfl: 1   fluticasone (FLONASE) 50 MCG/ACT nasal spray, Place 2 sprays into both nostrils daily., Disp: 16 g, Rfl: 1   hydrOXYzine (ATARAX/VISTARIL) 10 MG tablet, Take 1 tablet (10 mg total) by mouth 3 (three) times daily as needed., Disp: 60 tablet, Rfl: 0   ipratropium-albuterol (DUONEB) 0.5-2.5 (3) MG/3ML SOLN, Take 3 mLs by nebulization every 6 (six) hours as needed., Disp: 120 mL, Rfl: 0   ipratropium-albuterol (DUONEB) 0.5-2.5 (3) MG/3ML SOLN, USE 1 AMPULE IN NEBULIZER VIA NEBULIZATION EVERY 6 HOURS AS NEEDED, Disp: 360 mL, Rfl: 0   loratadine (CLARITIN) 10 MG tablet, Take 1 tablet (10 mg total) by mouth daily., Disp: 30 tablet, Rfl: 1    methocarbamol (ROBAXIN) 500 MG tablet, Take 1 tablet (500 mg total) by mouth every 8 (eight) hours as needed for muscle spasms., Disp: 90 tablet, Rfl: 1   naproxen (NAPROSYN) 500 MG tablet, Take 1 tablet (500 mg total) by mouth 2 (two) times daily with a meal., Disp: 60 tablet, Rfl: 6   ranitidine (ZANTAC) 150 MG tablet, Take 150 mg by mouth., Disp: , Rfl:    sertraline (ZOLOFT) 100 MG tablet, Take 1 tablet by mouth once daily, Disp: 30 tablet, Rfl: 2   traZODone (DESYREL) 50 MG tablet, Take 0.5-1 tablets (25-50 mg total) by mouth at bedtime as needed for sleep., Disp: 30 tablet, Rfl: 3   Vitamin D, Ergocalciferol, (DRISDOL) 1.25 MG (50000 UNIT) CAPS capsule, Take 1 capsule (50,000 Units total) by mouth every 7 (seven) days., Disp: 4 capsule, Rfl: 2   Medications ordered in this encounter:  Meds ordered this encounter  Medications   ondansetron (ZOFRAN-ODT) 4 MG disintegrating tablet    Sig: Take 1 tablet (4 mg total) by mouth every 8 (eight) hours as needed for nausea or vomiting.    Dispense:  20 tablet    Refill:  0    Order Specific Question:   Supervising Provider    Answer:   Hyacinth Meeker, BRIAN [[id number]]     *If you need refills on other medications prior to your next appointment, please contact your pharmacy*  Follow-Up: Call back or seek an in-person evaluation if the symptoms  worsen or if the condition fails to improve as anticipated.  Other Instructions Please keep well-hydrated and get plenty of rest. Start a daily probiotic as discussed.  Follow the BRAT diet below. You can use the Zofran as directed, when needed for nausea. Symptoms should continue improving from this point on until completely resolved.   Bland Diet A bland diet consists of foods that are often soft and do not have a lot of fat, fiber, or extra seasonings. Foods without fat, fiber, or seasoning are easier for the body to digest. They are also less likely to irritate your mouth, throat, stomach, and other parts of  your digestive system. A bland diet is sometimes called a BRAT diet. What is my plan? Your health care provider or food and nutrition specialist (dietitian) may recommend specific changes to your diet to prevent symptoms or to treat your symptoms. These changes may include: Eating small meals often. Cooking food until it is soft enough to chew easily. Chewing your food well. Drinking fluids slowly. Not eating foods that are very spicy, sour, or fatty. Not eating citrus fruits, such as oranges and grapefruit. What do I need to know about this diet? Eat a variety of foods from the bland diet food list. Do not follow a bland diet longer than needed. Ask your health care provider whether you should take vitamins or supplements. What foods can I eat? Grains Hot cereals, such as cream of wheat. Rice. Bread, crackers, or tortillas made from refined white flour. Vegetables Canned or cooked vegetables. Mashed or boiled potatoes. Fruits Bananas. Applesauce. Other types of cooked or canned fruit with the skin and seeds removed, such as canned peaches or pears. Meats and other proteins Scrambled eggs. Creamy peanut butter or other nut butters. Lean, well-cooked meats, such as chicken or fish. Tofu. Soups or broths. Dairy Low-fat dairy products, such as milk, cottage cheese, or yogurt. Beverages Water. Herbal tea. Apple juice. Fats and oils Mild salad dressings. Canola or olive oil. Sweets and desserts Pudding. Custard. Fruit gelatin. Ice cream. The items listed above may not be a complete list of recommended foods and beverages. Contact a dietitian for more options. What foods are not recommended? Grains Whole grain breads and cereals. Vegetables Raw vegetables. Fruits Raw fruits, especially citrus, berries, or dried fruits. Dairy Whole fat dairy foods. Beverages Caffeinated drinks. Alcohol. Seasonings and condiments Strongly flavored seasonings or condiments. Hot sauce. Salsa. Other  foods Spicy foods. Fried foods. Sour foods, such as pickled or fermented foods. Foods with high sugar content. Foods high in fiber. The items listed above may not be a complete list of foods and beverages to avoid. Contact a dietitian for more information. Summary A bland diet consists of foods that are often soft and do not have a lot of fat, fiber, or extra seasonings. Foods without fat, fiber, or seasoning are easier for the body to digest. Check with your health care provider to see how long you should follow this diet plan. It is not meant to be followed for long periods. This information is not intended to replace advice given to you by your health care provider. Make sure you discuss any questions you have with your health care provider. Document Revised: 05/28/2017 Document Reviewed: 05/28/2017 Elsevier Patient Education  2022 [employer].    If you have been instructed to have an in-person evaluation today at a local Urgent Care facility, please use the link below. It will take you to a list of all of  our available [facility], including address, phone number and hours of operation. Please do not delay care.  [facility]  If you or a family member do not have a primary care provider, use the link below to schedule a visit and establish care. When you choose a [facility] primary care physician or advanced practice provider, you gain a long-term partner in health. Find a Primary Care Provider  Learn more about [facility] in-office and virtual care options: [facility] - Get Care Now

## 2021-04-10 NOTE — Progress Notes (Signed)
Virtual Visit Consent   Kelli Wells, you are scheduled for a virtual visit with a [facility] provider today.     Just as with appointments in the office, your consent must be obtained to participate.  Your consent will be active for this visit and any virtual visit you may have with one of our providers in the next 365 days.     If you have a MyChart account, a copy of this consent can be sent to you electronically.  All virtual visits are billed to your insurance company just like a traditional visit in the office.    As this is a virtual visit, video technology does not allow for your provider to perform a traditional examination.  This may limit your provider's ability to fully assess your condition.  If your provider identifies any concerns that need to be evaluated in person or the need to arrange testing (such as labs, EKG, etc.), we will make arrangements to do so.     Although advances in technology are sophisticated, we cannot ensure that it will always work on either your end or our end.  If the connection with a video visit is poor, the visit may have to be switched to a telephone visit.  With either a video or telephone visit, we are not always able to ensure that we have a secure connection.     I need to obtain your verbal consent now.   Are you willing to proceed with your visit today?    Kelli Wells has provided verbal consent on 04/10/2021 for a virtual visit (video or telephone).   Kelli Wells, [place]   Date: 04/10/2021 9:41 AM   Virtual Visit via Video Note   I, Kelli Wells, connected with  Kelli Wells  ([id number], [birth year]) on 04/10/21 at  9:30 AM EST by a video-enabled telemedicine application and verified that I am speaking with the correct person using two identifiers.  Location: Patient: Virtual Visit Location Patient: Mobile --  Provider: Virtual Visit Location Provider: Home Office   I discussed the limitations  of evaluation and management by telemedicine and the availability of in person appointments. The patient expressed understanding and agreed to proceed.    History of Present Illness: Kelli Wells is a 35 y.o. who identifies as a female who was assigned female at birth, and is being seen today for possible stomach bug. Endorses symptoms starting shortly after thanksgiving with nausea, cramping, loose stool and emesis. Notes symptoms were significant for a few days. Have now been improving but still present. She denies fevers, chills.  Noted some aches initially. Has noted nausea and vomiting with last episode of emesis last night.  Non-bloody. Diarrhea present but improved now down to 3 stools in a 24 hour period but still very loose. Has been hydrating but not eating much. Denies melena, hematochezia or tenesmus.     HPI: HPI  Problems:  Patient Active Problem List   Diagnosis Date Noted   Stress 01/10/2021   Psychophysiological insomnia 01/10/2021   Abnormal cervical Papanicolaou smear 01/09/2021   Asthma    Hypertension 12/14/2015   Anxiety and depression 12/14/2015   Cholecystitis with cholelithiasis 12/17/2014   Non-reactive NST (non-stress test)    Esophageal reflux 02/02/2013   Anxiety state 05/02/2011   Attention deficit hyperactivity disorder (ADHD) 05/02/2011    Allergies:  Allergies  Allergen Reactions   Sulfa Antibiotics Anaphylaxis and Swelling   Shellfish Allergy Swelling  Facial swelling    Medications:  Current Outpatient Medications:    ondansetron (ZOFRAN-ODT) 4 MG disintegrating tablet, Take 1 tablet (4 mg total) by mouth every 8 (eight) hours as needed for nausea or vomiting., Disp: 20 tablet, Rfl: 0   albuterol (VENTOLIN HFA) 108 (90 Base) MCG/ACT inhaler, Inhale 1-2 puffs into the lungs every 6 (six) hours as needed for wheezing or shortness of breath., Disp: 18 g, Rfl: 0   amLODipine (NORVASC) 10 MG tablet, Take 1 tablet by mouth once daily, Disp:  30 tablet, Rfl: 2   buPROPion (WELLBUTRIN XL) 150 MG 24 hr tablet, Take 150 mg by mouth every morning., Disp: , Rfl:    carvedilol (COREG) 25 MG tablet, TAKE 1 TABLET BY MOUTH TWICE DAILY WITH A MEAL, Disp: 60 tablet, Rfl: 2   EPINEPHrine (EPIPEN 2-PAK) 0.3 mg/0.3 mL IJ SOAJ injection, Inject 0.3 mLs (0.3 mg total) into the muscle as needed for anaphylaxis., Disp: 1 each, Rfl: 1   fluticasone (FLONASE) 50 MCG/ACT nasal spray, Place 2 sprays into both nostrils daily., Disp: 16 g, Rfl: 1   hydrOXYzine (ATARAX/VISTARIL) 10 MG tablet, Take 1 tablet (10 mg total) by mouth 3 (three) times daily as needed., Disp: 60 tablet, Rfl: 0   ipratropium-albuterol (DUONEB) 0.5-2.5 (3) MG/3ML SOLN, Take 3 mLs by nebulization every 6 (six) hours as needed., Disp: 120 mL, Rfl: 0   ipratropium-albuterol (DUONEB) 0.5-2.5 (3) MG/3ML SOLN, USE 1 AMPULE IN NEBULIZER VIA NEBULIZATION EVERY 6 HOURS AS NEEDED, Disp: 360 mL, Rfl: 0   loratadine (CLARITIN) 10 MG tablet, Take 1 tablet (10 mg total) by mouth daily., Disp: 30 tablet, Rfl: 1   methocarbamol (ROBAXIN) 500 MG tablet, Take 1 tablet (500 mg total) by mouth every 8 (eight) hours as needed for muscle spasms., Disp: 90 tablet, Rfl: 1   naproxen (NAPROSYN) 500 MG tablet, Take 1 tablet (500 mg total) by mouth 2 (two) times daily with a meal., Disp: 60 tablet, Rfl: 6   ranitidine (ZANTAC) 150 MG tablet, Take 150 mg by mouth., Disp: , Rfl:    sertraline (ZOLOFT) 100 MG tablet, Take 1 tablet by mouth once daily, Disp: 30 tablet, Rfl: 2   traZODone (DESYREL) 50 MG tablet, Take 0.5-1 tablets (25-50 mg total) by mouth at bedtime as needed for sleep., Disp: 30 tablet, Rfl: 3   Vitamin D, Ergocalciferol, (DRISDOL) 1.25 MG (50000 UNIT) CAPS capsule, Take 1 capsule (50,000 Units total) by mouth every 7 (seven) days., Disp: 4 capsule, Rfl: 2  Observations/Objective: Patient is well-developed, well-nourished in no acute distress.  Resting comfortably at home.  Head is normocephalic,  atraumatic.  No labored breathing. Speech is clear and coherent with logical content.  Patient is alert and oriented at baseline.   Assessment and Plan: 1. Gastroenteritis - ondansetron (ZOFRAN-ODT) 4 MG disintegrating tablet; Take 1 tablet (4 mg total) by mouth every 8 (eight) hours as needed for nausea or vomiting.  Dispense: 20 tablet; Refill: 0 Question mild food poisoning giving lack of sick contact and timing of symptoms. No alarm signs/symptoms present with symptoms already substantially improved. No indication for antibiotic treatment at this time. Continue supportive measures with rest and good hydration. Start Zofran ODT as needed for nausea. BRAT diet reviewed and to be started. Things should only continue to improve from here. If anything acutely worsens she needs in person assessment. UC/ER precautions reviewed. Work note provided.   Follow Up Instructions: I discussed the assessment and treatment plan with the patient. The patient  was provided an opportunity to ask questions and all were answered. The patient agreed with the plan and demonstrated an understanding of the instructions.  A copy of instructions were sent to the patient via MyChart unless otherwise noted below.   The patient was advised to call back or seek an in-person evaluation if the symptoms worsen or if the condition fails to improve as anticipated.  Time:  I spent 12 minutes with the patient via telehealth technology discussing the above problems/concerns.    Kelli Climes, PA-C

## 2021-04-23 ENCOUNTER — Encounter: Payer: Medicaid Other | Admitting: Clinical

## 2021-04-29 IMAGING — DX DG ANKLE COMPLETE 3+V*R*
3 series · 3 of 3 positions shown · non-contrast
Comparison: None.

CLINICAL DATA: Inversion injury 2 weeks ago with persistent ankle
pain, initial encounter

EXAM:
RIGHT ANKLE - COMPLETE 3+ VIEW

[ankle ap]
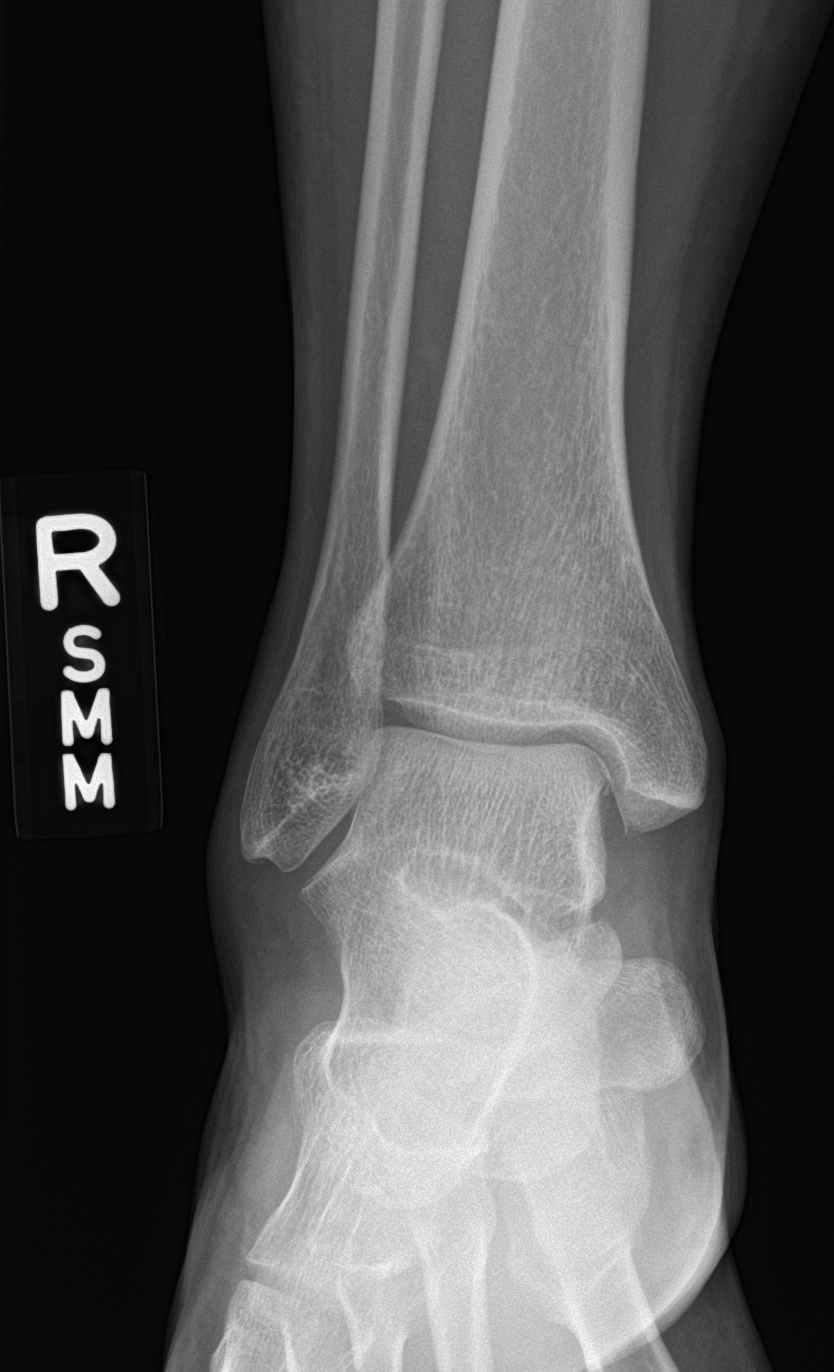

[ankle obl]
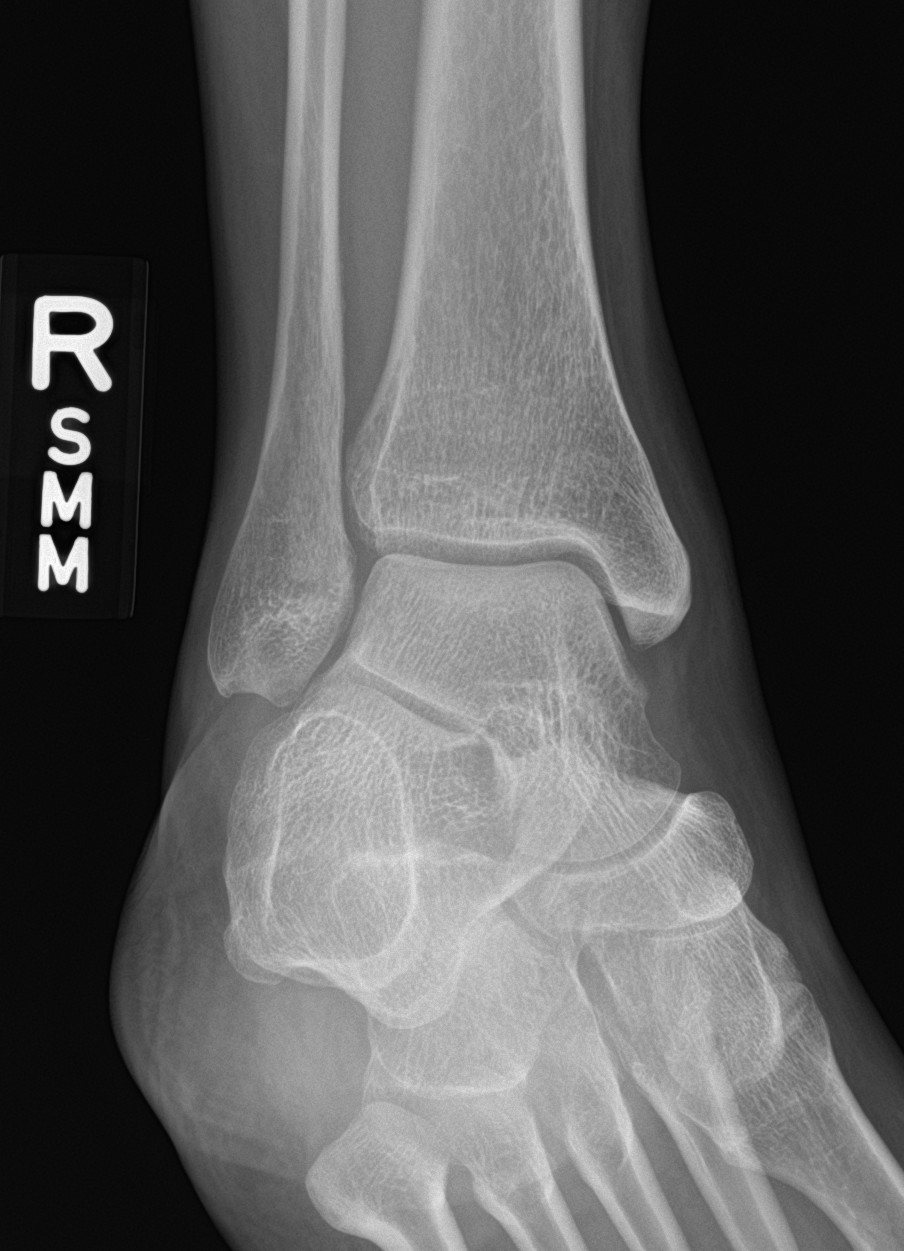

[ankle lat]
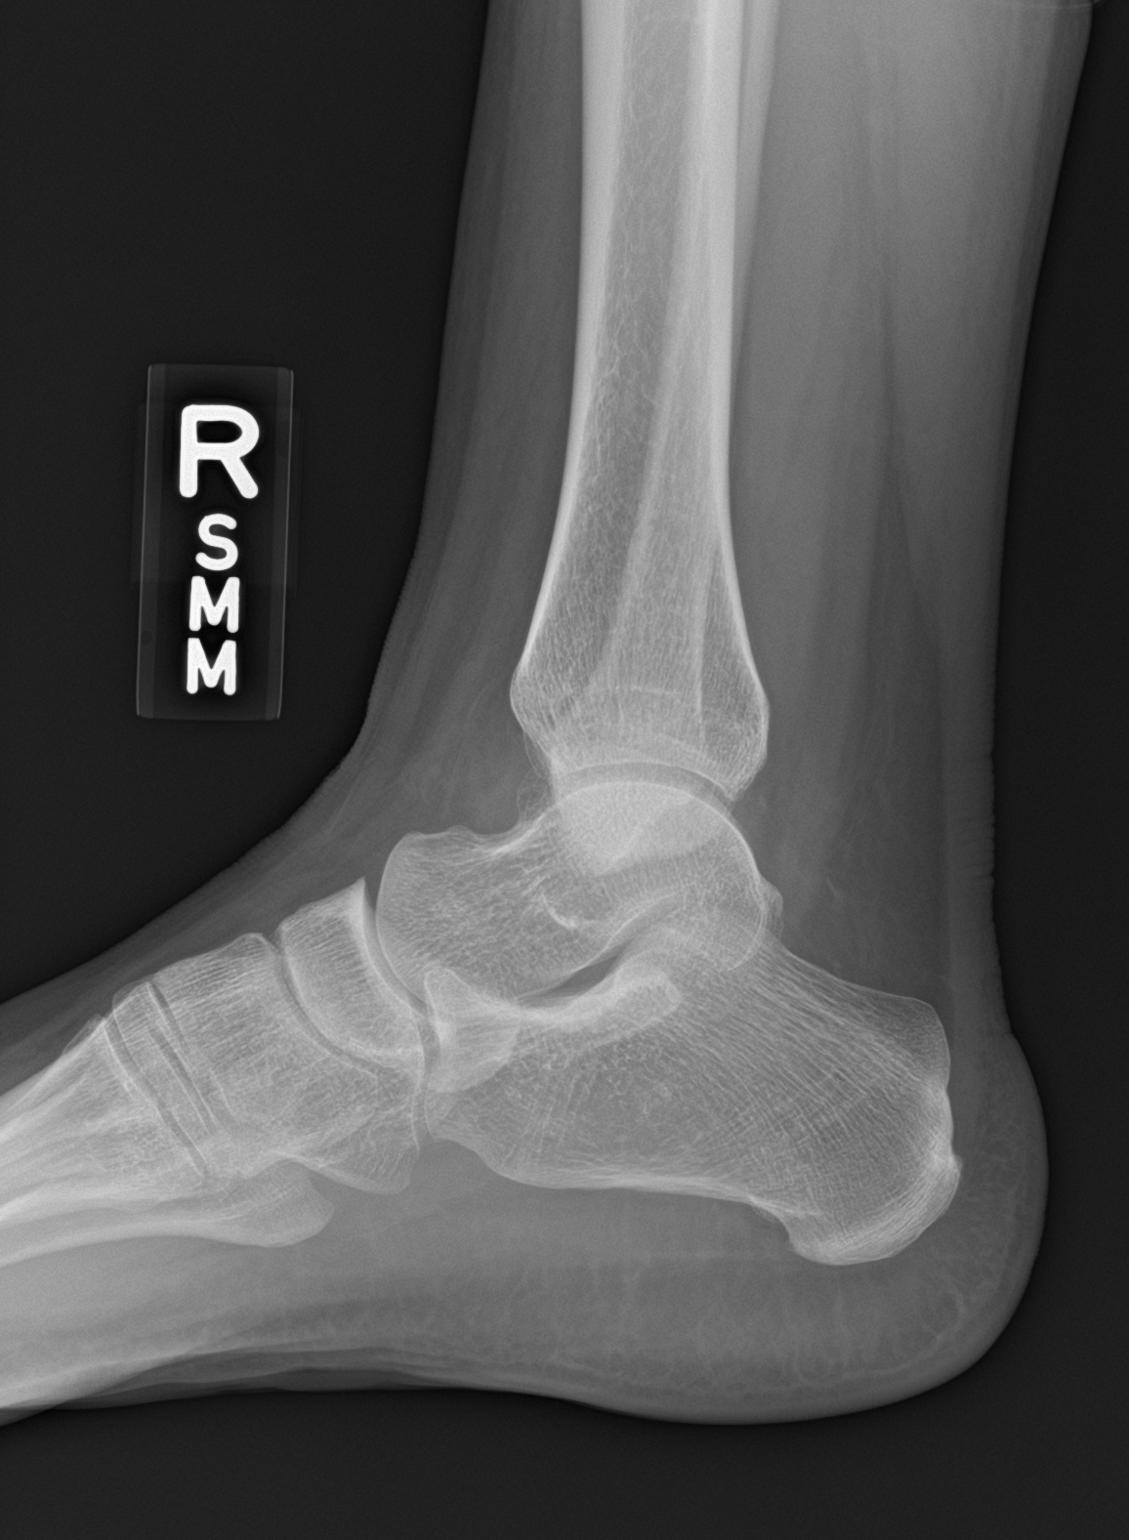

[3 of 3 positions shown; findings below may reference images not displayed]

FINDINGS: Mild soft tissue swelling is noted laterally. No definitive fracture
or dislocation is seen.
IMPRESSION: Soft tissue swelling laterally without definitive fracture.

## 2021-05-09 ENCOUNTER — Other Ambulatory Visit: Payer: Self-pay

## 2021-05-21 ENCOUNTER — Encounter: Payer: Medicaid Other | Admitting: Clinical

## 2021-05-21 ENCOUNTER — Other Ambulatory Visit: Payer: Self-pay | Admitting: Family Medicine

## 2021-05-21 DIAGNOSIS — R519 Headache, unspecified: Secondary | ICD-10-CM | POA: Diagnosis not present

## 2021-05-21 DIAGNOSIS — U071 COVID-19: Secondary | ICD-10-CM | POA: Diagnosis not present

## 2021-05-21 NOTE — Telephone Encounter (Signed)
Medication: buPROPion (WELLBUTRIN XL) 150 MG 24 hr tablet [[id number]]   Has the patient contacted their pharmacy? YES Pharmacy calling  (Agent: If no, request that the patient contact the pharmacy for the refill. If patient does not wish to contact the pharmacy document the reason why and proceed with request.) (Agent: If yes, when and what did the pharmacy advise?)  Preferred Pharmacy (with phone number or street name): [facility] [address] [place], [place] - [address] [place] [address] Phone: [phone] Fax: [phone] Hours: Not open 24 hours   Has the patient been seen for an appointment in the last year OR does the patient have an upcoming appointment? YES 05/22/2021  Agent: Please be advised that RX refills may take up to 3 business days. We ask that you follow-up with your pharmacy.

## 2021-05-22 ENCOUNTER — Telehealth (HOSPITAL_BASED_OUTPATIENT_CLINIC_OR_DEPARTMENT_OTHER): Payer: Medicaid Other | Admitting: Family Medicine

## 2021-05-22 ENCOUNTER — Encounter: Payer: Medicaid Other | Admitting: Clinical

## 2021-05-22 ENCOUNTER — Other Ambulatory Visit: Payer: Self-pay

## 2021-05-22 ENCOUNTER — Encounter: Payer: Self-pay | Admitting: Family Medicine

## 2021-05-22 DIAGNOSIS — F419 Anxiety disorder, unspecified: Secondary | ICD-10-CM | POA: Diagnosis not present

## 2021-05-22 DIAGNOSIS — Z13228 Encounter for screening for other metabolic disorders: Secondary | ICD-10-CM | POA: Diagnosis not present

## 2021-05-22 DIAGNOSIS — I10 Essential (primary) hypertension: Secondary | ICD-10-CM | POA: Diagnosis not present

## 2021-05-22 DIAGNOSIS — U071 COVID-19: Secondary | ICD-10-CM

## 2021-05-22 DIAGNOSIS — F32A Depression, unspecified: Secondary | ICD-10-CM

## 2021-05-22 DIAGNOSIS — F5104 Psychophysiologic insomnia: Secondary | ICD-10-CM | POA: Diagnosis not present

## 2021-05-22 MED ORDER — TRAZODONE HCL 50 MG PO TABS
50.0000 mg | ORAL_TABLET | Freq: Every evening | ORAL | 6 refills | Status: DC | PRN
  Filled 2021-05-22: qty 30, 30d supply, fill #0

## 2021-05-22 MED ORDER — HYDROXYZINE HCL 10 MG PO TABS
10.0000 mg | ORAL_TABLET | Freq: Three times a day (TID) | ORAL | 0 refills | Status: DC | PRN
  Filled 2021-05-22: qty 60, 20d supply, fill #0

## 2021-05-22 MED ORDER — SERTRALINE HCL 100 MG PO TABS
100.0000 mg | ORAL_TABLET | Freq: Every day | ORAL | 6 refills | Status: DC
  Filled 2021-05-22 – 2021-07-20 (×2): qty 30, 30d supply, fill #0

## 2021-05-22 MED ORDER — CARVEDILOL 25 MG PO TABS
25.0000 mg | ORAL_TABLET | Freq: Two times a day (BID) | ORAL | 6 refills | Status: DC
  Filled 2021-05-22 – 2021-10-16 (×2): qty 60, 30d supply, fill #0
  Filled 2021-12-10: qty 60, 30d supply, fill #1

## 2021-05-22 MED ORDER — AMLODIPINE BESYLATE 10 MG PO TABS
10.0000 mg | ORAL_TABLET | Freq: Every day | ORAL | 6 refills | Status: DC
  Filled 2021-05-22 – 2021-07-20 (×2): qty 30, 30d supply, fill #0

## 2021-05-22 MED ORDER — BUPROPION HCL ER (XL) 150 MG PO TB24
150.0000 mg | ORAL_TABLET | Freq: Every morning | ORAL | 6 refills | Status: DC
  Filled 2021-05-22: qty 30, 30d supply, fill #0

## 2021-05-22 NOTE — Telephone Encounter (Signed)
Requested Prescriptions  Pending Prescriptions Disp Refills   buPROPion (WELLBUTRIN XL) 150 MG 24 hr tablet      Sig: Take 1 tablet (150 mg total) by mouth every morning.     Psychiatry: Antidepressants - bupropion Failed - 05/21/2021  6:17 PM      Failed - Last BP in normal range    BP Readings from Last 1 Encounters:  01/09/21 (!) 147/90         Failed - Valid encounter within last 6 months    Recent Outpatient Visits          9 months ago Poor concentration   [place] Charlott Rakes, MD   1 year ago Anxiety and depression   [place], [place], NP   1 year ago Angioedema, initial encounter   [place] Kimber [place], MD   1 year ago [place], MD   2 years ago [place], [place]      Future Appointments            Today Charlott Rakes, MD [place] - Completed PHQ-2 or PHQ-9 in the last 360 days

## 2021-05-22 NOTE — Progress Notes (Signed)
Virtual Visit via Video Note  I connected with Kelli Wells, on 05/22/2021 at 3:38 PM by video enabled telemedicine device due to the COVID-19 pandemic and verified that I am speaking with the correct person using two identifiers.   Consent: I discussed the limitations, risks, security and privacy concerns of performing an evaluation and management service by telemedicine and the availability of in person appointments. I also discussed with the patient that there may be a patient responsible charge related to this service. The patient expressed understanding and agreed to proceed.   Location of Patient: Home  Location of Provider: Clinic   Persons participating in Telemedicine visit: Seabron Spates [place]leigh Dr. Margarita Rana     History of Present Illness: Kelli Wells is a 36 y.o. year old female  with a history of hypertension, anxiety and depression who presents today for follow-up visit.   She has COVID since Decemeber 28 and was only able to tolerate Paxil elevated for 2 days due to adverse effect.  Rapid flu test, strep and were negative.  She is vaccinated against COVID-19. She went bak to work last week but on the weekend she was down again and has been out of work earlier this week. She has body aches, fatigue, headaches, she has congestion, her throat feels like she swallowed rocks.  Denies presence of dyspnea, chest pain, wheezing and she has not had asthma exacerbation and has not had to use her inhaler. BP was 138/88 oxygen saturation was normal. Seen at [employer] yesterday and was put out of work till today; she works at the [employer] at [employer].  She also needs refills on her medications for anxiety and depression.  Currently doing well on combination of Zoloft, Wellbutrin and hydroxyzine.  Also undergoing psychotherapy with LCSW. Compliant with her antihypertensive and she is requesting refills today. Past Medical History:   Diagnosis Date   Anxiety    Asthma    Chronic hypertension in pregnancy    Depression    GERD (gastroesophageal reflux disease)    Hypertension    Infection    UTI   Pregnancy induced hypertension    Vaginal Pap smear, abnormal    LEEP, normal since   Allergies  Allergen Reactions   Sulfa Antibiotics Anaphylaxis and Swelling   Shellfish Allergy Swelling    Facial swelling     Current Outpatient Medications on File Prior to Visit  Medication Sig Dispense Refill   albuterol (VENTOLIN HFA) 108 (90 Base) MCG/ACT inhaler Inhale 1-2 puffs into the lungs every 6 (six) hours as needed for wheezing or shortness of breath. 18 g 0   amLODipine (NORVASC) 10 MG tablet Take 1 tablet by mouth once daily 30 tablet 2   buPROPion (WELLBUTRIN XL) 150 MG 24 hr tablet Take 150 mg by mouth every morning.     carvedilol (COREG) 25 MG tablet TAKE 1 TABLET BY MOUTH TWICE DAILY WITH A MEAL 60 tablet 2   EPINEPHrine (EPIPEN 2-PAK) 0.3 mg/0.3 mL IJ SOAJ injection Inject 0.3 mLs (0.3 mg total) into the muscle as needed for anaphylaxis. 1 each 1   fluticasone (FLONASE) 50 MCG/ACT nasal spray Place 2 sprays into both nostrils daily. 16 g 1   hydrOXYzine (ATARAX/VISTARIL) 10 MG tablet Take 1 tablet (10 mg total) by mouth 3 (three) times daily as needed. 60 tablet 0   ipratropium-albuterol (DUONEB) 0.5-2.5 (3) MG/3ML SOLN Take 3 mLs by nebulization every 6 (six) hours as needed. [place]  mL 0   ipratropium-albuterol (DUONEB) 0.5-2.5 (3) MG/3ML SOLN USE 1 AMPULE IN NEBULIZER VIA NEBULIZATION EVERY 6 HOURS AS NEEDED 360 mL 0   loratadine (CLARITIN) 10 MG tablet Take 1 tablet (10 mg total) by mouth daily. 30 tablet 1   methocarbamol (ROBAXIN) 500 MG tablet Take 1 tablet (500 mg total) by mouth every 8 (eight) hours as needed for muscle spasms. 90 tablet 1   naproxen (NAPROSYN) 500 MG tablet Take 1 tablet (500 mg total) by mouth 2 (two) times daily with a meal. 60 tablet 6   ondansetron (ZOFRAN-ODT) 4 MG disintegrating  tablet Take 1 tablet (4 mg total) by mouth every 8 (eight) hours as needed for nausea or vomiting. 20 tablet 0   ranitidine (ZANTAC) 150 MG tablet Take 150 mg by mouth.     sertraline (ZOLOFT) 100 MG tablet Take 1 tablet by mouth once daily 30 tablet 2   traZODone (DESYREL) 50 MG tablet Take 0.5-1 tablets (25-50 mg total) by mouth at bedtime as needed for sleep. 30 tablet 3   Vitamin D, Ergocalciferol, (DRISDOL) 1.25 MG (50000 UNIT) CAPS capsule Take 1 capsule (50,000 Units total) by mouth every 7 (seven) days. 4 capsule 2   No current facility-administered medications on file prior to visit.    ROS: See HPI  Observations/Objective: BP 138/88 General-alert, awake, oriented x3 ENT-nasal speech Respiratory-not in respiratory distress Skin-normal Psych-normal  CMP Latest Ref Rng & Units 08/02/2020 06/21/2019 06/22/2018  Glucose 65 - 99 mg/dL 104(H) 76 84  BUN 6 - 20 mg/dL _0 Creatinine 0.57 - 1.00 mg/dL 0.71 0.65 0.68  Sodium 134 - 144 mmol/L 140 135 137  Potassium 3.5 - 5.2 mmol/L 4.3 4.3 4.0  Chloride 96 - 106 mmol/L 104 102 104  CO2 20 - 29 mmol/L 19(L) 20 21  Calcium 8.7 - 10.2 mg/dL 9.6 9.5 9.2  Total Protein 6.0 - 8.5 g/dL - 6.8 -  Total Bilirubin 0.0 - 1.2 mg/dL - 0.2 -  Alkaline Phos 39 - 117 IU/L - 104 -  AST 0 - 40 IU/L - 9 -  ALT 0 - 32 IU/L - 11 -    Lipid Panel     Component Value Date/Time   CHOL 191 09/26/2017 0906   TRIG 47 09/26/2017 0906   HDL 61 09/26/2017 0906   CHOLHDL 3.1 09/26/2017 0906   CHOLHDL 3.4 01/01/2016 1020   VLDL 10 01/01/2016 1020   LDLCALC 121 (H) 09/26/2017 0906   LABVLDL 9 09/26/2017 0906    No results found for: HGBA1C   Assessment and Plan: 1. Essential hypertension Controlled Continue current rate Counseled on blood pressure goal of less than 130/80, low-sodium, DASH diet, medication compliance, 150 minutes of moderate intensity exercise per week. Discussed medication compliance, adverse effects. - amLODipine (NORVASC) 10  MG tablet; Take 1 tablet (10 mg total) by mouth daily.  Dispense: 30 tablet; Refill: 6 - carvedilol (COREG) 25 MG tablet; Take 1 tablet (25 mg total) by mouth 2 (two) times daily with a meal.  Dispense: 60 tablet; Refill: 6 - LP+Non-HDL Cholesterol; Future - CMP14+EGFR; Future - CBC with Differential/Platelet; Future  2. Anxiety and depression Controlled - buPROPion (WELLBUTRIN XL) 150 MG 24 hr tablet; Take 1 tablet (150 mg total) by mouth every morning.  Dispense: 30 tablet; Refill: 6 - sertraline (ZOLOFT) 100 MG tablet; Take 1 tablet (100 mg total) by mouth daily.  Dispense: 30 tablet; Refill: 6 - hydrOXYzine (ATARAX) 10 MG tablet; Take 1  tablet (10 mg total) by mouth 3 (three) times daily as needed.  Dispense: 60 tablet; Refill: 0  3. Psychophysiological insomnia Controlled - traZODone (DESYREL) 50 MG tablet; Take 1 tablet (50 mg total) by mouth at bedtime as needed for sleep.  Dispense: 30 tablet; Refill: 6  4. Screening for metabolic disorder - Hemoglobin A1c; Future  5. COVID-19 virus infection Unable to tolerate Paxil with due to adverse effects Placed on short course of prednisone Provided note for work   Follow Up Instructions: 6 months for chronic disease management   I discussed the assessment and treatment plan with the patient. The patient was provided an opportunity to ask questions and all were answered. The patient agreed with the plan and demonstrated an understanding of the instructions.   The patient was advised to call back or seek an in-person evaluation if the symptoms worsen or if the condition fails to improve as anticipated.     I provided 20 minutes total of Telehealth time during this encounter including median intraservice time, reviewing previous notes, investigations, ordering medications, medical decision making, coordinating care and patient verbalized understanding at the end of the visit.     Charlott Rakes, MD, FAAFP. [facility] [place], [place]   05/22/2021, 3:38 PM

## 2021-05-23 ENCOUNTER — Other Ambulatory Visit: Payer: Self-pay

## 2021-05-23 ENCOUNTER — Ambulatory Visit: Payer: Medicaid Other | Attending: Family Medicine

## 2021-05-23 ENCOUNTER — Telehealth: Payer: Self-pay | Admitting: Family Medicine

## 2021-05-23 ENCOUNTER — Telehealth: Payer: Self-pay

## 2021-05-23 DIAGNOSIS — I10 Essential (primary) hypertension: Secondary | ICD-10-CM | POA: Diagnosis not present

## 2021-05-23 DIAGNOSIS — Z13228 Encounter for screening for other metabolic disorders: Secondary | ICD-10-CM

## 2021-05-23 MED ORDER — PREDNISONE 20 MG PO TABS
20.0000 mg | ORAL_TABLET | Freq: Every day | ORAL | 0 refills | Status: DC
  Filled 2021-05-23: qty 5, 5d supply, fill #0

## 2021-05-23 NOTE — Telephone Encounter (Signed)
Pt has been called and informed of medication being sent to pharmacy. 

## 2021-05-23 NOTE — Telephone Encounter (Signed)
Patient states PCP was suppose to send in PREDNISONE and states while she is out she would like to pick up medication as soon as possible, patient would like a follow up call when sent  [facility] Phone:  [phone]  Fax:  [phone]

## 2021-05-23 NOTE — Telephone Encounter (Signed)
Done

## 2021-05-23 NOTE — Telephone Encounter (Signed)
Pt had video visit yesterday and prednisone was prescribed but not sent to her pharmacy.

## 2021-05-23 NOTE — Telephone Encounter (Signed)
Note has been sent to PCP already.

## 2021-05-24 ENCOUNTER — Telehealth: Payer: Self-pay

## 2021-05-24 LAB — CBC WITH DIFFERENTIAL/PLATELET
Basophils Absolute: 0 10*3/uL (ref 0.0–0.2)
Basos: 0 %
EOS (ABSOLUTE): 0.3 10*3/uL (ref 0.0–0.4)
Eos: 4 %
Hematocrit: 46 % (ref 34.0–46.6)
Hemoglobin: 15.4 g/dL (ref 11.1–15.9)
Immature Grans (Abs): 0 10*3/uL (ref 0.0–0.1)
Immature Granulocytes: 0 %
Lymphocytes Absolute: 2.5 10*3/uL (ref 0.7–3.1)
Lymphs: 35 %
MCH: 29.4 pg (ref 26.6–33.0)
MCHC: 33.5 g/dL (ref 31.5–35.7)
MCV: 88 fL (ref 79–97)
Monocytes Absolute: 0.8 10*3/uL (ref 0.1–0.9)
Monocytes: 11 %
Neutrophils Absolute: 3.5 10*3/uL (ref 1.4–7.0)
Neutrophils: 50 %
Platelets: 385 10*3/uL (ref 150–450)
RBC: 5.24 x10E6/uL (ref 3.77–5.28)
RDW: 13.2 % (ref 11.7–15.4)
WBC: 7.1 10*3/uL (ref 3.4–10.8)

## 2021-05-24 LAB — HEMOGLOBIN A1C
Est. average glucose Bld gHb Est-mCnc: 120 mg/dL
Hgb A1c MFr Bld: 5.8 % — ABNORMAL HIGH (ref 4.8–5.6)

## 2021-05-24 LAB — LP+NON-HDL CHOLESTEROL
Cholesterol, Total: 183 mg/dL (ref 100–199)
HDL: 41 mg/dL (ref >39)
LDL Chol Calc (NIH): 127 mg/dL — ABNORMAL HIGH (ref 0–99)
Total Non-HDL-Chol (LDL+VLDL): 142 mg/dL — ABNORMAL HIGH (ref 0–129)
Triglycerides: 78 mg/dL (ref 0–149)
VLDL Cholesterol Cal: 15 mg/dL (ref 5–40)

## 2021-05-24 LAB — CMP14+EGFR
ALT: 16 IU/L (ref 0–32)
AST: 12 IU/L (ref 0–40)
Albumin/Globulin Ratio: 1.7 (ref 1.2–2.2)
Albumin: 4.1 g/dL (ref 3.8–4.8)
Alkaline Phosphatase: 109 IU/L (ref 44–121)
BUN/Creatinine Ratio: 12 (ref 9–23)
BUN: 9 mg/dL (ref 6–20)
Bilirubin Total: <0.2 mg/dL (ref 0.0–1.2)
CO2: 20 mmol/L (ref 20–29)
Calcium: 9.3 mg/dL (ref 8.7–10.2)
Chloride: 104 mmol/L (ref 96–106)
Creatinine, Ser: 0.76 mg/dL (ref 0.57–1.00)
Globulin, Total: 2.4 g/dL (ref 1.5–4.5)
Glucose: 88 mg/dL (ref 70–99)
Potassium: 4.1 mmol/L (ref 3.5–5.2)
Sodium: 140 mmol/L (ref 134–144)
Total Protein: 6.5 g/dL (ref 6.0–8.5)
eGFR: 103 mL/min/{1.73_m2} (ref >59)

## 2021-05-24 NOTE — Telephone Encounter (Signed)
-----   Message from Ladell Pier, MD sent at 05/24/2021  7:49 AM EST ----- Let patient know that she is in the range for prediabetes.  Cholesterol level mildly elevated at 127 with goal being less than 100.  Healthy eating habits and regular exercise will help to lower cholesterol and prevent progression to diabetes.  Blood cell counts are normal.  Kidney and liver function test normal.

## 2021-05-24 NOTE — Telephone Encounter (Signed)
Patient name and DOB has been verified Patient was informed of lab results. Patient had no questions.   Pt was given a letter to return to work on Monday due to her picking up medication 2 days late

## 2021-05-28 ENCOUNTER — Other Ambulatory Visit: Payer: Self-pay

## 2021-05-29 ENCOUNTER — Other Ambulatory Visit (HOSPITAL_COMMUNITY): Payer: Self-pay

## 2021-06-27 ENCOUNTER — Other Ambulatory Visit: Payer: Self-pay

## 2021-07-02 ENCOUNTER — Encounter: Payer: Self-pay | Admitting: Family Medicine

## 2021-07-06 ENCOUNTER — Encounter (HOSPITAL_COMMUNITY): Payer: Self-pay | Admitting: Emergency Medicine

## 2021-07-06 ENCOUNTER — Ambulatory Visit (HOSPITAL_COMMUNITY)
Admission: EM | Admit: 2021-07-06 | Discharge: 2021-07-06 | Disposition: A | Discharge status: Home / Self Care | Payer: Medicaid Other | Attending: Physician Assistant | Admitting: Physician Assistant

## 2021-07-06 ENCOUNTER — Other Ambulatory Visit: Payer: Self-pay

## 2021-07-06 DIAGNOSIS — L02213 Cutaneous abscess of chest wall: Secondary | ICD-10-CM | POA: Diagnosis not present

## 2021-07-06 MED ORDER — KETOROLAC TROMETHAMINE 30 MG/ML IJ SOLN
30.0000 mg | Freq: Once | INTRAMUSCULAR | Status: AC
  Administered 2021-07-06: 30 mg via INTRAMUSCULAR

## 2021-07-06 MED ORDER — KETOROLAC TROMETHAMINE 30 MG/ML IJ SOLN
INTRAMUSCULAR | Status: AC
  Filled 2021-07-06: qty 1

## 2021-07-06 MED ORDER — FLUCONAZOLE 150 MG PO TABS: 150.0000 mg | ORAL_TABLET | Freq: Once | ORAL | 0 refills | Status: AC

## 2021-07-06 MED ORDER — MUPIROCIN 2 % EX OINT: 1.0000 "application " | TOPICAL_OINTMENT | Freq: Every day | CUTANEOUS | 0 refills | Status: DC

## 2021-07-06 MED ORDER — CEPHALEXIN 500 MG PO CAPS: 500.0000 mg | ORAL_CAPSULE | Freq: Four times a day (QID) | ORAL | 0 refills | Status: AC

## 2021-07-06 NOTE — ED Provider Notes (Signed)
[facility]    CSN: [id number] Arrival date & time: 07/06/21  1927      History   Chief Complaint Chief Complaint  Patient presents with   Abscess    HPI Kelli Wells is a 36 y.o. female.   Patient presents today with 3-day history of enlarging abscess on her chest wall/lower her right breast.  She has a history of recurrent episodes and has had to have them drained in the past with most recent episode approximately 1.5 years ago.  She denies any recent antibiotics.  She has been seen by the breast on her multiple times but has not undergone any surgical intervention for removal of cysts/abscesses.  She denies history of MRSA or recurrent skin infections.  Denies any recent antibiotics.  She is confident she is not pregnant.  She denies any fever, nausea, vomiting, dizziness, fatigue, malaise.   Past Medical History:  Diagnosis Date   Anxiety    Asthma    Chronic hypertension in pregnancy    Depression    GERD (gastroesophageal reflux disease)    Hypertension    Infection    UTI   Pregnancy induced hypertension    Vaginal Pap smear, abnormal    LEEP, normal since    Patient Active Problem List   Diagnosis Date Noted   Stress 01/10/2021   Psychophysiological insomnia 01/10/2021   Abnormal cervical Papanicolaou smear 01/09/2021   Asthma    Hypertension 12/14/2015   Anxiety and depression 12/14/2015   Cholecystitis with cholelithiasis 12/17/2014   Non-reactive NST (non-stress test)    Esophageal reflux 02/02/2013   Anxiety state 05/02/2011   Attention deficit hyperactivity disorder (ADHD) 05/02/2011    Past Surgical History:  Procedure Laterality Date   CHOLECYSTECTOMY N/A 12/18/2014   Procedure: LAPAROSCOPIC CHOLECYSTECTOMY WITH INTRAOPERATIVE CHOLANGIOGRAM;  Surgeon: Chevis Pretty III, MD;  Location: [facility];  Service: General;  Laterality: N/A;   LEEP     LYMPH GLAND EXCISION      OB History     Gravida  2   Para  2   Term  2   Preterm   0   AB  0   Living  2      SAB  0   IAB  0   Ectopic  0   Multiple  0   Live Births  2            Home Medications    Prior to Admission medications   Medication Sig Start Date End Date Taking? Authorizing Provider  cephALEXin (KEFLEX) 500 MG capsule Take 1 capsule (500 mg total) by mouth 4 (four) times daily for 7 days. 07/06/21 07/13/21 Yes Emmelina Mcloughlin, Noberto Retort, PA-C  mupirocin ointment (BACTROBAN) 2 % Apply 1 application topically daily. 07/06/21  Yes Nayeli Calvert K, PA-C  albuterol (VENTOLIN HFA) 108 (90 Base) MCG/ACT inhaler Inhale 1-2 puffs into the lungs every 6 (six) hours as needed for wheezing or shortness of breath. 08/02/20   Hoy Register, MD  amLODipine (NORVASC) 10 MG tablet Take 1 tablet (10 mg total) by mouth daily. 05/22/21   Hoy Register, MD  buPROPion (WELLBUTRIN XL) 150 MG 24 hr tablet Take 1 tablet (150 mg total) by mouth every morning. 05/22/21   Hoy Register, MD  carvedilol (COREG) 25 MG tablet Take 1 tablet (25 mg total) by mouth 2 (two) times daily with a meal. 05/22/21   Newlin, Enobong, MD  EPINEPHrine (EPIPEN 2-PAK) 0.3 mg/0.3 mL IJ SOAJ injection Inject  0.3 mLs (0.3 mg total) into the muscle as needed for anaphylaxis. 09/08/19   Zadie Rhine, MD  fluticasone (FLONASE) 50 MCG/ACT nasal spray Place 2 sprays into both nostrils daily. 06/21/19   Hoy Register, MD  hydrOXYzine (ATARAX) 10 MG tablet Take 1 tablet (10 mg total) by mouth 3 (three) times daily as needed. 05/22/21   Hoy Register, MD  ipratropium-albuterol (DUONEB) 0.5-2.5 (3) MG/3ML SOLN Take 3 mLs by nebulization every 6 (six) hours as needed. 03/06/20   Wieters, Hallie C, PA-C  ipratropium-albuterol (DUONEB) 0.5-2.5 (3) MG/3ML SOLN USE 1 AMPULE IN NEBULIZER VIA NEBULIZATION EVERY 6 HOURS AS NEEDED 03/06/20 03/06/21  Wieters, Hallie C, PA-C  loratadine (CLARITIN) 10 MG tablet Take 1 tablet (10 mg total) by mouth daily. 10/20/18   Hoy Register, MD  methocarbamol (ROBAXIN) 500 MG tablet  Take 1 tablet (500 mg total) by mouth every 8 (eight) hours as needed for muscle spasms. 09/29/18   Hoy Register, MD  naproxen (NAPROSYN) 500 MG tablet Take 1 tablet (500 mg total) by mouth 2 (two) times daily with a meal. 09/29/18   Newlin, Enobong, MD  ondansetron (ZOFRAN-ODT) 4 MG disintegrating tablet Take 1 tablet (4 mg total) by mouth every 8 (eight) hours as needed for nausea or vomiting. 04/10/21   Waldon Merl, PA-C  predniSONE (DELTASONE) 20 MG tablet Take 1 tablet (20 mg total) by mouth daily with breakfast. 05/23/21   Hoy Register, MD  ranitidine (ZANTAC) 150 MG tablet Take 150 mg by mouth. 07/09/12   [provider]  sertraline (ZOLOFT) 100 MG tablet Take 1 tablet (100 mg total) by mouth daily. 05/22/21   Hoy Register, MD  traZODone (DESYREL) 50 MG tablet Take 1 tablet (50 mg total) by mouth at bedtime as needed for sleep. 05/22/21   Hoy Register, MD  Vitamin D, Ergocalciferol, (DRISDOL) 1.25 MG (50000 UNIT) CAPS capsule Take 1 capsule (50,000 Units total) by mouth every 7 (seven) days. 01/10/21   Mayers, Kasandra Knudsen, PA-C    Family History Family History  Problem Relation Age of Onset   Hypertension Maternal Grandmother    Cancer Maternal Grandmother        breast   Hypertension Maternal Grandfather    Heart attack Maternal Grandfather    Kidney disease Paternal Grandmother    Heart disease Paternal Grandfather    Hypertension Mother    Cancer Mother        rectal   Hearing loss Neg Hx     Social History Social History   Tobacco Use   Smoking status: Every Day    Packs/day: 0.25    Types: Cigarettes   Smokeless tobacco: Never   Tobacco comments:    quit June 2015  Vaping Use   Vaping Use: Never used  Substance Use Topics   Alcohol use: Yes    Alcohol/week: 2.0 - 3.0 standard drinks    Types: 2 - 3 Glasses of wine per week    Comment: occas   Drug use: No     Allergies   Sulfa antibiotics and Shellfish allergy   Review of Systems Review  of Systems  Constitutional:  Positive for activity change. Negative for appetite change, fatigue and fever.  Respiratory:  Negative for cough and shortness of breath.   Cardiovascular:  Negative for chest pain.  Gastrointestinal:  Negative for abdominal pain, diarrhea, nausea and vomiting.  Skin:  Positive for color change. Negative for wound.  Neurological:  Negative for dizziness, light-headedness and headaches.  Physical Exam Triage Vital Signs ED Triage Vitals [07/06/21 1954]  Enc Vitals Group     BP (!) 145/94     Pulse Rate 75     Resp 18     Temp 98.4 F (36.9 C)     Temp Source Oral     SpO2 98 %     Weight      Height      Head Circumference      Peak Flow      Pain Score      Pain Loc      Pain Edu?      Excl. in GC?    No data found.  Updated Vital Signs BP (!) 145/94 (BP Location: Right Arm)    Pulse 75    Temp 98.4 F (36.9 C) (Oral)    Resp 18    SpO2 98%   Visual Acuity Right Eye Distance:   Left Eye Distance:   Bilateral Distance:    Right Eye Near:   Left Eye Near:    Bilateral Near:     Physical Exam Vitals reviewed.  Constitutional:      General: She is awake. She is not in acute distress.    Appearance: Normal appearance. She is well-developed. She is not ill-appearing.     Comments: Very pleasant female appears stated age in no acute distress sitting comfortably in exam room  HENT:     Head: Normocephalic and atraumatic.  Cardiovascular:     Rate and Rhythm: Normal rate and regular rhythm.     Heart sounds: Normal heart sounds, S1 normal and S2 normal. No murmur heard. Pulmonary:     Effort: Pulmonary effort is normal.     Breath sounds: Normal breath sounds. No wheezing, rhonchi or rales.     Comments: Clear to auscultation bilaterally Abdominal:     Palpations: Abdomen is soft.     Tenderness: There is no abdominal tenderness.  Skin:    Findings: Abscess present.          Comments: 3 cm x 1 cm abscess noted right chest wall at  base of breast.  No active bleeding or drainage noted.  No streaking or evidence of lymphangitis.  Psychiatric:        Behavior: Behavior is cooperative.     UC Treatments / Results  Labs (all labs ordered are listed, but only abnormal results are displayed) Labs Reviewed - No data to display  EKG   Radiology No results found.  Procedures Incision and Drainage  Date/Time: 07/06/2021 8:28 PM Performed by: Jeani Hawkingaspet, Brenden Rudman K, PA-C Authorized by: Jeani Hawkingaspet, Khyran Riera K, PA-C   Consent:    Consent obtained:  Verbal   Consent given by:  Patient   Risks, benefits, and alternatives were discussed: yes     Risks discussed:  Bleeding, incomplete drainage, infection and damage to other organs   Alternatives discussed:  No treatment, alternative treatment, observation and referral Universal protocol:    Procedure explained and questions answered to patient or proxy's satisfaction: yes     Patient identity confirmed:  Verbally with patient Location:    Type:  Abscess   Size:  3 cm x 1 cm   Location:  Trunk   Trunk location:  Chest Pre-procedure details:    Skin preparation:  Chlorhexidine Sedation:    Sedation type:  None Anesthesia:    Anesthesia method:  Local infiltration   Local anesthetic:  Lidocaine 1% WITH epi Procedure type:  Complexity:  Simple Procedure details:    Incision types:  Stab incision   Incision depth:  Dermal   Wound management:  Probed and deloculated and irrigated with saline   Drainage:  Bloody and purulent   Drainage amount:  Scant   Wound treatment:  Wound left open   Packing materials:  None Post-procedure details:    Procedure completion:  Tolerated (including critical care time)  Medications Ordered in UC Medications  ketorolac (TORADOL) 30 MG/ML injection 30 mg (has no administration in time range)    Initial Impression / Assessment and Plan / UC Course  I have reviewed the triage vital signs and the nursing notes.  Pertinent labs & imaging  results that were available during my care of the patient were reviewed by me and considered in my medical decision making (see chart for details).     I&D performed in clinic with improvement of symptoms.  See procedure note above.  Given proximity to breast will cover with Keflex.  Patient was encouraged to keep area clean and apply Bactroban ointment with dressing changes.  She was given Toradol for pain and encouraged to avoid NSAIDs for 12 hours.  She can use Tylenol for pain tonight.  Discussed that tomorrow she can alternate Tylenol and ibuprofen for pain relief.  She is well-established with the breast center and was encouraged to follow-up with them.  We discussed alarm symptoms that warrant emergent evaluation including enlarging lesion, increased or different in character of drainage, fever, nausea, vomiting, increased pain.  Strict return precautions given to which she expressed understanding.  Final Clinical Impressions(s) / UC Diagnoses   Final diagnoses:  Cutaneous abscess of chest wall     Discharge Instructions      Take cephalexin 4 times a day for 1 week.  Keep area clean and apply Bactroban ointment.  We gave you an injection of Toradol today so do not take any ibuprofen/Advil/naproxen for 12 hours.  You can take Tylenol if needed overnight.  Follow-up with breast center as we discussed.  If you have any worsening symptoms including increased pain, enlarging lesion, fever, nausea, vomiting you should be seen immediately.     ED Prescriptions     Medication Sig Dispense Auth. Provider   cephALEXin (KEFLEX) 500 MG capsule Take 1 capsule (500 mg total) by mouth 4 (four) times daily for 7 days. 28 capsule Dilan Fullenwider K, PA-C   mupirocin ointment (BACTROBAN) 2 % Apply 1 application topically daily. 22 g Gatsby Chismar K, PA-C      PDMP not reviewed this encounter.   Jeani Hawking, PA-C 07/06/21 2029

## 2021-07-06 NOTE — ED Triage Notes (Signed)
Pt has abscess on right breast that has become larger insize, pain, and redness.

## 2021-07-06 NOTE — Telephone Encounter (Signed)
I attempted to call pt, no answer, left vm.

## 2021-07-06 NOTE — Discharge Instructions (Signed)
Take cephalexin 4 times a day for 1 week.  Keep area clean and apply Bactroban ointment.  We gave you an injection of Toradol today so do not take any ibuprofen/Advil/naproxen for 12 hours.  You can take Tylenol if needed overnight.  Follow-up with breast center as we discussed.  If you have any worsening symptoms including increased pain, enlarging lesion, fever, nausea, vomiting you should be seen immediately.

## 2021-07-09 ENCOUNTER — Ambulatory Visit: Payer: Self-pay | Admitting: *Deleted

## 2021-07-09 ENCOUNTER — Telehealth: Payer: Self-pay | Admitting: Family Medicine

## 2021-07-09 NOTE — Telephone Encounter (Signed)
Pt is call.ing to see if she can get a sooner Hospital Follow up than 08/09/21. Pt was seen in the hospital on 07/06/22 for Cutaneous abscess of chest wall. Pt was also advised to follow up with the cancer center. Referral for the cancer center was originally place 07/21.  Please advise- [phone]

## 2021-07-09 NOTE — Telephone Encounter (Signed)
°  Chief Complaint: Post abscess I&D Symptoms: pain, drainage Frequency:   Pertinent Negatives: Patient denies fever, vomiting Disposition: [] ED /[] Urgent Care (no appt availability in office) / [] Appointment(In office/virtual)/ []  [facility]/ [] Home Care/ [] Refused Recommended Disposition /[] [facility]/ []  Follow-up with PCP Additional Notes: Patient requesting follow up appointment, referral to Breast Center - patient is at work and can not wait- request call back from office with appointment

## 2021-07-09 NOTE — Telephone Encounter (Signed)
Pt has been scheduled for an appointment tomorrow.

## 2021-07-09 NOTE — Telephone Encounter (Signed)
Reason for Disposition  [1] Caller has NON-URGENT question AND [2] triager unable to answer question  Answer Assessment - Initial Assessment Questions 1. SYMPTOM: "What's the main symptom you're concerned about?" (e.g., pain, fever, vomiting)     Pain from procedure 2. ONSET: "When did drainage  start?"     I&D UC- on Friday- wound left open to drain 3. SURGERY: "What surgery did you have?"     I&D  4. DATE of SURGERY: "When was the surgery?"      Friiday 5. ANESTHESIA: " What type of anesthesia did you have?" (e.g., general, spinal, epidural, local)     local 6. PAIN: "Is there any pain?" If Yes, ask: "How bad is it?"  (Scale 1-10; or mild, moderate, severe)     mild 7. FEVER: "Do you have a fever?" If Yes, ask: "What is your temperature, how was it measured, and when did it start?"     No fever 8. VOMITING: "Is there any vomiting?" If Yes, ask: "How many times?"     no 9. BLEEDING: "Is there any bleeding?" If Yes, ask: "How much?" and "Where?"     Drainage- small circle- clear/blood 10. OTHER SYMPTOMS: "Do you have any other symptoms?" (e.g., drainage from wound, painful urination, constipation)       Some drainage  Protocols used: Post-Op Symptoms and Questions-A-AH

## 2021-07-10 ENCOUNTER — Encounter: Payer: Self-pay | Admitting: Family Medicine

## 2021-07-10 ENCOUNTER — Telehealth: Payer: Self-pay

## 2021-07-10 ENCOUNTER — Ambulatory Visit: Payer: Medicaid Other | Attending: Family Medicine | Admitting: Family Medicine

## 2021-07-10 ENCOUNTER — Other Ambulatory Visit: Payer: Self-pay

## 2021-07-10 VITALS — BP 145/83 | HR 88 | Ht 62.0 in | Wt 201.8 lb

## 2021-07-10 DIAGNOSIS — L732 Hidradenitis suppurativa: Secondary | ICD-10-CM

## 2021-07-10 DIAGNOSIS — N611 Abscess of the breast and nipple: Secondary | ICD-10-CM

## 2021-07-10 MED ORDER — CHLORHEXIDINE GLUCONATE 4 % EX LIQD: Freq: Every day | CUTANEOUS | 0 refills | Status: DC | PRN

## 2021-07-10 MED ORDER — CLINDAMYCIN PHOSPHATE 1 % EX SOLN: Freq: Two times a day (BID) | CUTANEOUS | 1 refills | Status: DC

## 2021-07-10 NOTE — Progress Notes (Signed)
Pt has breast abscess.

## 2021-07-10 NOTE — Progress Notes (Signed)
Subjective:  Patient ID: Kelli Wells, female    DOB: 1986/04/22  Age: 36 y.o. MRN: [id number]  CC: Breast Pain   HPI Kelli Wells is a 36 y.o. year old female with a history of hypertension, anxiety and depression who presents today for follow-up of her left breast abscess.  Interval History: She has had a R breast abscess x6 days and was seen at the ED 4 days ago and underwent an incision and drainage and placed on Keflex and Bactroban ointment.  She complains that since then the right breast abscess has gotten bigger. She does endorse a history of similar abscesses in the past and endorses a history of furuncles in her groin in the past. She had a mammogram in 11/2019 which revealed no evidence of malignancy in either breast. Past Medical History:  Diagnosis Date   Anxiety    Asthma    Chronic hypertension in pregnancy    Depression    GERD (gastroesophageal reflux disease)    Hypertension    Infection    UTI   Pregnancy induced hypertension    Vaginal Pap smear, abnormal    LEEP, normal since    Past Surgical History:  Procedure Laterality Date   CHOLECYSTECTOMY N/A 12/18/2014   Procedure: LAPAROSCOPIC CHOLECYSTECTOMY WITH INTRAOPERATIVE CHOLANGIOGRAM;  Surgeon: Chevis Pretty III, MD;  Location: [facility];  Service: General;  Laterality: N/A;   LEEP     LYMPH GLAND EXCISION      Family History  Problem Relation Age of Onset   Hypertension Maternal Grandmother    Cancer Maternal Grandmother        breast   Hypertension Maternal Grandfather    Heart attack Maternal Grandfather    Kidney disease Paternal Grandmother    Heart disease Paternal Grandfather    Hypertension Mother    Cancer Mother        rectal   Hearing loss Neg Hx     Allergies  Allergen Reactions   Sulfa Antibiotics Anaphylaxis and Swelling   Shellfish Allergy Swelling    Facial swelling     Outpatient Medications Prior to Visit  Medication Sig Dispense Refill   albuterol  (VENTOLIN HFA) 108 (90 Base) MCG/ACT inhaler Inhale 1-2 puffs into the lungs every 6 (six) hours as needed for wheezing or shortness of breath. 18 g 0   amLODipine (NORVASC) 10 MG tablet Take 1 tablet (10 mg total) by mouth daily. 30 tablet 6   buPROPion (WELLBUTRIN XL) 150 MG 24 hr tablet Take 1 tablet (150 mg total) by mouth every morning. 30 tablet 6   carvedilol (COREG) 25 MG tablet Take 1 tablet (25 mg total) by mouth 2 (two) times daily with a meal. 60 tablet 6   cephALEXin (KEFLEX) 500 MG capsule Take 1 capsule (500 mg total) by mouth 4 (four) times daily for 7 days. 28 capsule 0   EPINEPHrine (EPIPEN 2-PAK) 0.3 mg/0.3 mL IJ SOAJ injection Inject 0.3 mLs (0.3 mg total) into the muscle as needed for anaphylaxis. 1 each 1   fluticasone (FLONASE) 50 MCG/ACT nasal spray Place 2 sprays into both nostrils daily. 16 g 1   hydrOXYzine (ATARAX) 10 MG tablet Take 1 tablet (10 mg total) by mouth 3 (three) times daily as needed. 60 tablet 0   ipratropium-albuterol (DUONEB) 0.5-2.5 (3) MG/3ML SOLN Take 3 mLs by nebulization every 6 (six) hours as needed. 120 mL 0   loratadine (CLARITIN) 10 MG tablet Take 1 tablet (10 mg total) by  mouth daily. 30 tablet 1   methocarbamol (ROBAXIN) 500 MG tablet Take 1 tablet (500 mg total) by mouth every 8 (eight) hours as needed for muscle spasms. 90 tablet 1   mupirocin ointment (BACTROBAN) 2 % Apply 1 application topically daily. 22 g 0   naproxen (NAPROSYN) 500 MG tablet Take 1 tablet (500 mg total) by mouth 2 (two) times daily with a meal. 60 tablet 6   ondansetron (ZOFRAN-ODT) 4 MG disintegrating tablet Take 1 tablet (4 mg total) by mouth every 8 (eight) hours as needed for nausea or vomiting. 20 tablet 0   predniSONE (DELTASONE) 20 MG tablet Take 1 tablet (20 mg total) by mouth daily with breakfast. 5 tablet 0   ranitidine (ZANTAC) 150 MG tablet Take 150 mg by mouth.     sertraline (ZOLOFT) 100 MG tablet Take 1 tablet (100 mg total) by mouth daily. 30 tablet 6    traZODone (DESYREL) 50 MG tablet Take 1 tablet (50 mg total) by mouth at bedtime as needed for sleep. 30 tablet 6   Vitamin D, Ergocalciferol, (DRISDOL) 1.25 MG (50000 UNIT) CAPS capsule Take 1 capsule (50,000 Units total) by mouth every 7 (seven) days. 4 capsule 2   ipratropium-albuterol (DUONEB) 0.5-2.5 (3) MG/3ML SOLN USE 1 AMPULE IN NEBULIZER VIA NEBULIZATION EVERY 6 HOURS AS NEEDED 360 mL 0   No facility-administered medications prior to visit.     ROS Review of Systems  Constitutional:  Negative for activity change, appetite change and fatigue.  HENT:  Negative for congestion, sinus pressure and sore throat.   Eyes:  Negative for visual disturbance.  Respiratory:  Negative for cough, chest tightness, shortness of breath and wheezing.   Cardiovascular:  Negative for chest pain and palpitations.  Gastrointestinal:  Negative for abdominal distention, abdominal pain and constipation.  Endocrine: Negative for polydipsia.  Genitourinary:  Negative for dysuria and frequency.  Musculoskeletal:  Negative for arthralgias and back pain.  Skin:  Negative for rash.  Neurological:  Negative for tremors, light-headedness and numbness.  Hematological:  Does not bruise/bleed easily.  Psychiatric/Behavioral:  Negative for agitation and behavioral problems.    Objective:  BP (!) 145/83    Pulse 88    Ht 5\' 2"  (1.575 m)    Wt 201 lb 12.8 oz (91.5 kg)    SpO2 96%    BMI 36.91 kg/m   BP/Weight 07/10/2021 07/06/2021 01/09/2021  Systolic BP 145 145 147  Diastolic BP 83 94 90  Wt. (Lbs) 201.8 - 191  BMI 36.91 - 34.93      Physical Exam Constitutional:      Appearance: She is well-developed.  Cardiovascular:     Rate and Rhythm: Normal rate.     Heart sounds: Normal heart sounds. No murmur heard. Pulmonary:     Effort: Pulmonary effort is normal.     Breath sounds: Normal breath sounds. No wheezing or rales.  Chest:     Chest wall: No tenderness.  Breasts:    Right: Mass (5 o'clok, hard,  TTP with punctum, surrounding erythema, no discharge) present.  Abdominal:     General: Bowel sounds are normal. There is no distension.     Palpations: Abdomen is soft. There is no mass.     Tenderness: There is no abdominal tenderness.  Musculoskeletal:        General: Normal range of motion.     Right lower leg: No edema.     Left lower leg: No edema.  Neurological:  Mental Status: She is alert and oriented to person, place, and time.  Psychiatric:        Mood and Affect: Mood normal.    CMP Latest Ref Rng & Units 05/23/2021 08/02/2020 06/21/2019  Glucose 70 - 99 mg/dL 88 902(I) 76  BUN 6 - 20 mg/dL 9 8 7   Creatinine 0.57 - 1.00 mg/dL 0.97 3.53  Sodium 134 - 144 mmol/L 140 140 135  Potassium 3.5 - 5.2 mmol/L 4.1 4.3 4.3  Chloride 96 - 106 mmol/L 104 104 102  CO2 20 - 29 mmol/L 20 19(L) 20  Calcium 8.7 - 10.2 mg/dL 9.3 9.6 9.5  Total Protein 6.0 - 8.5 g/dL 6.5 - 6.8  Total Bilirubin 0.0 - 1.2 mg/dL 2.99 - 0.2  Alkaline Phos 44 - 121 IU/L 109 - 104  AST 0 - 40 IU/L 12 - 9  ALT 0 - 32 IU/L 16 - 11    Lipid Panel     Component Value Date/Time   CHOL 183 05/23/2021 1033   TRIG 78 05/23/2021 1033   HDL 41 05/23/2021 1033   CHOLHDL 3.1 09/26/2017 0906   CHOLHDL 3.4 01/01/2016 1020   VLDL 10 01/01/2016 1020   LDLCALC 127 (H) 05/23/2021 1033    CBC    Component Value Date/Time   WBC 7.1 05/23/2021 1033   WBC 6.9 09/01/2017 1722   RBC 5.24 05/23/2021 1033   RBC 4.54 09/01/2017 1722   HGB 15.4 05/23/2021 1033   HCT 46.0 05/23/2021 1033   PLT 385 05/23/2021 1033   MCV 88 05/23/2021 1033   MCV 88 03/22/2013 0320   MCH 29.4 05/23/2021 1033   MCH 29.1 09/01/2017 1722   MCHC 33.5 05/23/2021 1033   MCHC 33.6 09/01/2017 1722   RDW 13.2 05/23/2021 1033   RDW 12.8 03/22/2013 0320   LYMPHSABS 2.5 05/23/2021 1033   EOSABS 0.3 05/23/2021 1033   BASOSABS 0.0 05/23/2021 1033    Lab Results  Component Value Date   HGBA1C 5.8 (H) 05/23/2021    Assessment & Plan:   1. Abscess of right breast She is predisposed to recurrent abscesses due to underlying history of hidradenitis which she was unaware of. Status post I&D but per patient abscess seems to be larger Currently on Keflex I will see her back in 2 days to reassess Advised that should she develop fever or myalgias she needs to go to the ED right away  2. Hidradenitis - chlorhexidine (HIBICLENS) 4 % external liquid; Apply topically daily as needed.  Dispense: 120 mL; Refill: 0 - clindamycin (CLEOCIN T) 1 % external solution; Apply topically 2 (two) times daily. For hidradenitis  Dispense: 60 mL; Refill: 1    Meds ordered this encounter  Medications   chlorhexidine (HIBICLENS) 4 % external liquid    Sig: Apply topically daily as needed.    Dispense:  120 mL    Refill:  0   clindamycin (CLEOCIN T) 1 % external solution    Sig: Apply topically 2 (two) times daily. For hidradenitis    Dispense:  60 mL    Refill:  1    Follow-up: Return in about 2 days (around 07/12/2021) for Follow-up of right breast abscess.       09/11/2021, MD, FAAFP. [facility] [place], [place]   07/10/2021, 4:58 PM

## 2021-07-10 NOTE — Telephone Encounter (Signed)
Telephoned patient, mailbox full not accepting messages.

## 2021-07-11 ENCOUNTER — Other Ambulatory Visit: Payer: Self-pay | Admitting: Obstetrics and Gynecology

## 2021-07-11 DIAGNOSIS — N611 Abscess of the breast and nipple: Secondary | ICD-10-CM

## 2021-07-12 ENCOUNTER — Ambulatory Visit: Payer: Medicaid Other | Attending: Family Medicine | Admitting: Family Medicine

## 2021-07-12 VITALS — BP 159/98 | HR 89 | Ht 62.0 in | Wt 199.0 lb

## 2021-07-12 DIAGNOSIS — N611 Abscess of the breast and nipple: Secondary | ICD-10-CM | POA: Diagnosis not present

## 2021-07-12 NOTE — Progress Notes (Signed)
Subjective:  Patient ID: Kelli Wells, female    DOB: 08-Sep-1985  Age: 36 y.o. MRN: [id number]  CC: Breast Problem   HPI Maicie Stephan Mckercher is a 49 y.o. year old female with a history of hypertension, anxiety and depression, hidradenitis, who presents today for follow-up of a left breast abscess  Interval History: She was seen 2 days ago in the office at which time she had been on antibiotics and had had an initial incision and drainage at the ED but was concerned that the lesion was getting bigger. Today she reports erythema has subsided and she has no fever.  She has been using warm compresses and is currently on Keflex. She has a diagnostic mammogram and breast ultrasound already scheduled for 3 weeks from now. Past Medical History:  Diagnosis Date   Anxiety    Asthma    Chronic hypertension in pregnancy    Depression    GERD (gastroesophageal reflux disease)    Hypertension    Infection    UTI   Pregnancy induced hypertension    Vaginal Pap smear, abnormal    LEEP, normal since    Past Surgical History:  Procedure Laterality Date   CHOLECYSTECTOMY N/A 12/18/2014   Procedure: LAPAROSCOPIC CHOLECYSTECTOMY WITH INTRAOPERATIVE CHOLANGIOGRAM;  Surgeon: Autumn Messing III, MD;  Location: [facility] OR;  Service: General;  Laterality: N/A;   LEEP     LYMPH GLAND EXCISION      Family History  Problem Relation Age of Onset   Hypertension Maternal Grandmother    Cancer Maternal Grandmother        breast   Hypertension Maternal Grandfather    Heart attack Maternal Grandfather    Kidney disease Paternal Grandmother    Heart disease Paternal Grandfather    Hypertension Mother    Cancer Mother        rectal   Hearing loss Neg Hx     Allergies  Allergen Reactions   Sulfa Antibiotics Anaphylaxis and Swelling   Shellfish Allergy Swelling    Facial swelling     Outpatient Medications Prior to Visit  Medication Sig Dispense Refill   albuterol (VENTOLIN HFA) 108 (90  Base) MCG/ACT inhaler Inhale 1-2 puffs into the lungs every 6 (six) hours as needed for wheezing or shortness of breath. 18 g 0   amLODipine (NORVASC) 10 MG tablet Take 1 tablet (10 mg total) by mouth daily. 30 tablet 6   buPROPion (WELLBUTRIN XL) 150 MG 24 hr tablet Take 1 tablet (150 mg total) by mouth every morning. 30 tablet 6   carvedilol (COREG) 25 MG tablet Take 1 tablet (25 mg total) by mouth 2 (two) times daily with a meal. 60 tablet 6   cephALEXin (KEFLEX) 500 MG capsule Take 1 capsule (500 mg total) by mouth 4 (four) times daily for 7 days. 28 capsule 0   chlorhexidine (HIBICLENS) 4 % external liquid Apply topically daily as needed. 120 mL 0   clindamycin (CLEOCIN T) 1 % external solution Apply topically 2 (two) times daily. For hidradenitis 60 mL 1   EPINEPHrine (EPIPEN 2-PAK) 0.3 mg/0.3 mL IJ SOAJ injection Inject 0.3 mLs (0.3 mg total) into the muscle as needed for anaphylaxis. 1 each 1   fluticasone (FLONASE) 50 MCG/ACT nasal spray Place 2 sprays into both nostrils daily. 16 g 1   hydrOXYzine (ATARAX) 10 MG tablet Take 1 tablet (10 mg total) by mouth 3 (three) times daily as needed. 60 tablet 0   ipratropium-albuterol (DUONEB) 0.5-2.5 (3)  MG/3ML SOLN Take 3 mLs by nebulization every 6 (six) hours as needed. 120 mL 0   loratadine (CLARITIN) 10 MG tablet Take 1 tablet (10 mg total) by mouth daily. 30 tablet 1   methocarbamol (ROBAXIN) 500 MG tablet Take 1 tablet (500 mg total) by mouth every 8 (eight) hours as needed for muscle spasms. 90 tablet 1   mupirocin ointment (BACTROBAN) 2 % Apply 1 application topically daily. 22 g 0   naproxen (NAPROSYN) 500 MG tablet Take 1 tablet (500 mg total) by mouth 2 (two) times daily with a meal. 60 tablet 6   ondansetron (ZOFRAN-ODT) 4 MG disintegrating tablet Take 1 tablet (4 mg total) by mouth every 8 (eight) hours as needed for nausea or vomiting. 20 tablet 0   predniSONE (DELTASONE) 20 MG tablet Take 1 tablet (20 mg total) by mouth daily with  breakfast. 5 tablet 0   ranitidine (ZANTAC) 150 MG tablet Take 150 mg by mouth.     sertraline (ZOLOFT) 100 MG tablet Take 1 tablet (100 mg total) by mouth daily. 30 tablet 6   traZODone (DESYREL) 50 MG tablet Take 1 tablet (50 mg total) by mouth at bedtime as needed for sleep. 30 tablet 6   Vitamin D, Ergocalciferol, (DRISDOL) 1.25 MG (50000 UNIT) CAPS capsule Take 1 capsule (50,000 Units total) by mouth every 7 (seven) days. 4 capsule 2   ipratropium-albuterol (DUONEB) 0.5-2.5 (3) MG/3ML SOLN USE 1 AMPULE IN NEBULIZER VIA NEBULIZATION EVERY 6 HOURS AS NEEDED 360 mL 0   No facility-administered medications prior to visit.     ROS Review of Systems  Constitutional:  Negative for activity change, appetite change and fatigue.  HENT:  Negative for congestion, sinus pressure and sore throat.   Eyes:  Negative for visual disturbance.  Respiratory:  Negative for cough, chest tightness, shortness of breath and wheezing.   Cardiovascular:  Negative for chest pain and palpitations.  Gastrointestinal:  Negative for abdominal distention, abdominal pain and constipation.  Endocrine: Negative for polydipsia.  Genitourinary:  Negative for dysuria and frequency.  Musculoskeletal:  Negative for arthralgias and back pain.  Skin:  Negative for rash.  Neurological:  Negative for tremors, light-headedness and numbness.  Hematological:  Does not bruise/bleed easily.  Psychiatric/Behavioral:  Negative for agitation and behavioral problems.    Objective:  BP (!) 159/98    Pulse 89    Ht 5\' 2"  (1.575 m)    Wt 199 lb (90.3 kg)    SpO2 97%    BMI 36.40 kg/m   BP/Weight 07/12/2021 07/10/2021 [id number]  Systolic BP Q000111Q Q000111Q Q000111Q  Diastolic BP 98 83 94  Wt. (Lbs) 199 201.8 -  BMI 36.4 36.91 -      Physical Exam Constitutional:      Appearance: She is well-developed.  Cardiovascular:     Rate and Rhythm: Normal rate.     Heart sounds: Normal heart sounds. No murmur heard. Pulmonary:     Effort: Pulmonary  effort is normal.     Breath sounds: Normal breath sounds. No wheezing or rales.  Chest:     Chest wall: No tenderness.  Breasts:    Right: Mass (R breast mass whihc has decreased in size compared to 2 days ago with resolution of erythema. No punctum noted, no discharge) and tenderness present.  Abdominal:     General: Bowel sounds are normal. There is no distension.     Palpations: Abdomen is soft. There is no mass.     Tenderness:  There is no abdominal tenderness.  Musculoskeletal:        General: Normal range of motion.     Right lower leg: No edema.     Left lower leg: No edema.  Neurological:     Mental Status: She is alert and oriented to person, place, and time.  Psychiatric:        Mood and Affect: Mood normal.    CMP Latest Ref Rng & Units 05/23/2021 08/02/2020 06/21/2019  Glucose 70 - 99 mg/dL 88 104(H) 76  BUN 6 - 20 mg/dL 9 8 7   Creatinine 0.57 - 1.00 mg/dL 0.76 0.71 0.65  Sodium 134 - 144 mmol/L 140 140 135  Potassium 3.5 - 5.2 mmol/L 4.1 4.3 4.3  Chloride 96 - 106 mmol/L 104 104 102  CO2 20 - 29 mmol/L 20 19(L) 20  Calcium 8.7 - 10.2 mg/dL 9.3 9.6 9.5  Total Protein 6.0 - 8.5 g/dL 6.5 - 6.8  Total Bilirubin 0.0 - 1.2 mg/dL <0.2 - 0.2  Alkaline Phos 44 - 121 IU/L 109 - 104  AST 0 - 40 IU/L 12 - 9  ALT 0 - 32 IU/L 16 - 11    Lipid Panel     Component Value Date/Time   CHOL 183 05/23/2021 1033   TRIG 78 05/23/2021 1033   HDL 41 05/23/2021 1033   CHOLHDL 3.1 09/26/2017 0906   CHOLHDL 3.4 01/01/2016 1020   VLDL 10 01/01/2016 1020   LDLCALC 127 (H) 05/23/2021 1033    CBC    Component Value Date/Time   WBC 7.1 05/23/2021 1033   WBC 6.9 09/01/2017 1722   RBC 5.24 05/23/2021 1033   RBC 4.54 09/01/2017 1722   HGB 15.4 05/23/2021 1033   HCT 46.0 05/23/2021 1033   PLT 385 05/23/2021 1033   MCV 88 05/23/2021 1033   MCV 88 03/22/2013 0320   MCH 29.4 05/23/2021 1033   MCH 29.1 09/01/2017 1722   MCHC 33.5 05/23/2021 1033   MCHC 33.6 09/01/2017 1722   RDW 13.2  05/23/2021 1033   RDW 12.8 03/22/2013 0320   LYMPHSABS 2.5 05/23/2021 1033   EOSABS 0.3 05/23/2021 1033   BASOSABS 0.0 05/23/2021 1033    Lab Results  Component Value Date   HGBA1C 5.8 (H) 05/23/2021    Assessment & Plan:  1. Abscess of right breast Improving Continue Keflex, warm compress Advised that if she notices purulent discharge she needs to go to the ED for I&D Keep appointment for diagnostic mammogram   No orders of the defined types were placed in this encounter.   Follow-up: Return in about 3 months (around 10/12/2021), or if symptoms worsen or fail to improve, for Chronic medical conditions.       Charlott Rakes, MD, FAAFP. [facility] [place], [place]   07/12/2021, 9:59 AM

## 2021-07-12 NOTE — Patient Instructions (Signed)
Mastitis Mastitis is inflammation of the breast tissue. It occurs most often in women who are breastfeeding, but it can also affect other women, and sometimes even men. What are the causes? This condition is usually caused by a bacterial infection. Bacteria can enter the breast tissue through cuts or openings in the skin. This usually occurs with breastfeeding because of cracked or irritated nipples. Sometimes, mastitis can occur when there are no cuts or openings in the skin. This is usually caused by plugged milk ducts. Other causes include: Nipple piercing. Some forms of breast cancer. What are the signs or symptoms? Symptoms of this condition include: Swelling, redness, tenderness, and pain in an area of the breast. The area may also feel warm to the touch. These symptoms usually affect the upper part of the breast, toward the armpit region. Swelling of the glands under the arm on the same side. Discharge from the nipple. Fever. Chills. Nausea and vomiting. Rapid pulse. Fatigue, headache, and flu-like muscle aches. If an infection is not treated, a collection of pus, or an abscess, may develop on the breast. How is this diagnosed? This condition can usually be diagnosed based on a physical exam and your symptoms. You may also have other tests, such as: Blood tests to check if your body is fighting an infection from bacteria. Mammogram or ultrasound tests to rule out other problems or diseases. Testing of pus and other fluids. Pus from the breast may be collected and tested in the lab. If an abscess has developed, the fluid in the abscess can be removed with a needle. This test can be used to confirm the diagnosis and identify the type of bacteria that is causing your mastitis. Culturing and testing breast milk for bacteria. This is done only if you are breastfeeding. How is this treated? Treatment for this condition may include: Applying heat or cold compresses to the affected  area. Medicine for pain. Antibiotic medicine to treat an infection from bacteria. This is usually taken by mouth. Self-care, including rest and drinking more fluid. Removing fluid with a needle, if an abscess has developed. Mastitis that occurs with breastfeeding will sometimes go away on its own, so your health care provider may choose to wait 24 hours after first seeing you to decide whether a prescription medicine is needed. You may be told of different ways to help manage breastfeeding, such as continuing to breastfeed or pump in order to ensure adequate milk flow. Follow these instructions at home: If you are breastfeeding:  Continue to empty your breasts as often as possible. You can empty your breasts by breastfeeding or by using a breast pump. This will decrease the pressure and the pain that comes with full breasts. Ask your health care provider if you should make changes to your breastfeeding or pumping routine. During breastfeeding, empty the first breast completely before going to the other breast. If your baby is not emptying your breasts completely, use a breast pump to empty your breasts. Keep your nipples clean and dry. Use breast massage during feeding or pumping sessions. If directed, apply moist heat to the affected area of your breast right before breastfeeding or pumping. Use the heat source that your health care provider recommends. If directed, put ice on the affected area of your breast right after breastfeeding or pumping. To do this: Put ice in a plastic bag. Place a towel between your skin and the bag. Leave the ice on for 20 minutes. Remove the ice if your skin   turns bright red. This is very important. If you cannot feel pain, heat, or cold, you have a greater risk of damage to the area. If you go back to work, pump your breasts while at work to stay within your nursing schedule. Avoid allowing your breasts to become very full with milk (engorged). Medicines Take  over-the-counter and prescription medicines only as told by your health care provider. If you were prescribed an antibiotic medicine, take it as told by your health care provider. Do not stop taking the antibiotic even if you start to feel better. General instructions Do not wear a tight or underwire bra. Wear a soft, supportive bra. Drink enough fluid to keep your urine pale yellow. This is especially important if you have a fever. Get plenty of rest. Keep all follow-up visits. This is important. Contact a health care provider if: You have pus-like discharge from the breast. You have a fever. Your symptoms do not improve within 2 days of starting treatment. Get help right away if: Your pain and swelling are getting worse. You have pain that is not controlled with medicine. You have a red line extending from the breast toward your armpit. Summary Mastitis is inflammation of the breast tissue. It occurs most often in women who are breastfeeding, but it can also affect non-breastfeeding women and some men. This condition is usually caused by a bacterial infection. This condition may be treated with hot and cold compresses, medicines, self-care, and certain breastfeeding strategies. If you were prescribed an antibiotic medicine, take it as told by your health care provider. Do not stop taking the antibiotic even if you start to feel better. This information is not intended to replace advice given to you by your health care provider. Make sure you discuss any questions you have with your health care provider. Document Revised: 02/28/2020 Document Reviewed: 02/28/2020 Elsevier Patient Education  2022 [employer]vier Inc.  

## 2021-07-18 ENCOUNTER — Other Ambulatory Visit: Payer: Self-pay | Admitting: Family Medicine

## 2021-07-18 DIAGNOSIS — F32A Depression, unspecified: Secondary | ICD-10-CM

## 2021-07-18 DIAGNOSIS — I10 Essential (primary) hypertension: Secondary | ICD-10-CM

## 2021-07-18 DIAGNOSIS — F419 Anxiety disorder, unspecified: Secondary | ICD-10-CM

## 2021-07-19 NOTE — Telephone Encounter (Signed)
Requested medication (s) are due for refill today: no ? ?Requested medication (s) are on the active medication list: yes   ? ?Last refill: 05/22/21  #30  6 refills ? ?Future visit scheduled no ? ?Notes to clinic: Not delegated, refills available. Cannot refuse non delegated meds per protocol. Please review. Thank you. ? ?Requested Prescriptions  ?Pending Prescriptions Disp Refills  ? sertraline (ZOLOFT) 100 MG tablet [Pharmacy Med Name: Sertraline HCl 100 MG Oral Tablet] 30 tablet 0  ?  Sig: Take 1 tablet by mouth once daily  ?  ? Not Delegated - Psychiatry:  Antidepressants - SSRI - sertraline Failed - 07/18/2021  5:53 PM  ?  ?  Failed - This refill cannot be delegated  ?  ?  Passed - AST in normal range and within 360 days  ?  AST  ?Date Value Ref Range Status  ?05/23/2021 12 0 - 40 IU/L Final  ? ?SGOT(AST)  ?Date Value Ref Range Status  ?03/22/2013 23 15 - 37 Unit/L Final  ?  ?  ?  ?  Passed - ALT in normal range and within 360 days  ?  ALT  ?Date Value Ref Range Status  ?05/23/2021 16 0 - 32 IU/L Final  ? ?SGPT (ALT)  ?Date Value Ref Range Status  ?03/22/2013 21 12 - 78 U/L Final  ?  ?  ?  ?  Passed - Completed PHQ-2 or PHQ-9 in the last 360 days  ?  ?  Passed - Valid encounter within last 6 months  ?  Recent Outpatient Visits   ? ?      ? 1 week ago Abscess of right breast  ? [facility] [place], Odette Horns, MD  ? 1 week ago Hidradenitis  ? [facility] [place], Odette Horns, MD  ? 1 month ago Screening for metabolic disorder  ? [facility] [place], Odette Horns, MD  ? 11 months ago Poor concentration  ? [facility] Wellness [place]oy Register, MD  ? 1 year ago Anxiety and depression  ? [facility] [place], [place], [place]  ? ?  ?  ? ?  ?  ?  ?Refused Prescriptions Disp Refills  ? amLODipine (NORVASC) 10 MG tablet [Pharmacy Med Name: amLODIPine Besylate 10 MG Oral Tablet] 30 tablet 0  ?  Sig: Take 1  tablet by mouth once daily  ?  ? Cardiovascular: Calcium Channel Blockers 2 Failed - 07/18/2021  5:53 PM  ?  ?  Failed - Last BP in normal range  ?  BP Readings from Last 1 Encounters:  ?07/12/21 (!) 159/98  ?  ?  ?  ?  Passed - Last Heart Rate in normal range  ?  Pulse Readings from Last 1 Encounters:  ?07/12/21 89  ?  ?  ?  ?  Passed - Valid encounter within last 6 months  ?  Recent Outpatient Visits   ? ?      ? 1 week ago Abscess of right breast  ? [facility] [place], Odette Horns, MD  ? 1 week ago Hidradenitis  ? [facility] [place], Odette Horns, MD  ? 1 month ago Screening for metabolic disorder  ? [facility] [place], Odette Horns, MD  ? 11 months ago Poor concentration  ? [facility] Wellness Hoy Register, MD  ? 1 year ago  Anxiety and depression  ? [facility] [place], [place], NP  ? ?  ?  ? ?  ?  ?  ? ? ? ? ?

## 2021-07-19 NOTE — Telephone Encounter (Signed)
Refills available. Sent via Interface ?Requested Prescriptions  ?Pending Prescriptions Disp Refills  ?? sertraline (ZOLOFT) 100 MG tablet [Pharmacy Med Name: Sertraline HCl 100 MG Oral Tablet] 30 tablet 0  ?  Sig: Take 1 tablet by mouth once daily  ?  ? Not Delegated - Psychiatry:  Antidepressants - SSRI - sertraline Failed - 07/18/2021  5:53 PM  ?  ?  Failed - This refill cannot be delegated  ?  ?  Passed - AST in normal range and within 360 days  ?  AST  ?Date Value Ref Range Status  ?05/23/2021 12 0 - 40 IU/L Final  ? ?SGOT(AST)  ?Date Value Ref Range Status  ?03/22/2013 23 15 - 37 Unit/L Final  ?   ?  ?  Passed - ALT in normal range and within 360 days  ?  ALT  ?Date Value Ref Range Status  ?05/23/2021 16 0 - 32 IU/L Final  ? ?SGPT (ALT)  ?Date Value Ref Range Status  ?03/22/2013 21 12 - 78 U/L Final  ?   ?  ?  Passed - Completed PHQ-2 or PHQ-9 in the last 360 days  ?  ?  Passed - Valid encounter within last 6 months  ?  Recent Outpatient Visits   ?      ? 1 week ago Abscess of right breast  ? [facility] [place], Odette Horns, MD  ? 1 week ago Hidradenitis  ? [facility] [place], Odette Horns, MD  ? 1 month ago Screening for metabolic disorder  ? [facility] [place], Odette Horns, MD  ? 11 months ago Poor concentration  ? [facility] Wellness Hoy Register, MD  ? 1 year ago Anxiety and depression  ? [facility] [place], [place], NP  ?  ?  ? ?  ?  ?  ?Refused Prescriptions Disp Refills  ?? amLODipine (NORVASC) 10 MG tablet [Pharmacy Med Name: amLODIPine Besylate 10 MG Oral Tablet] 30 tablet 0  ?  Sig: Take 1 tablet by mouth once daily  ?  ? Cardiovascular: Calcium Channel Blockers 2 Failed - 07/18/2021  5:53 PM  ?  ?  Failed - Last BP in normal range  ?  BP Readings from Last 1 Encounters:  ?07/12/21 (!) 159/98  ?   ?  ?  Passed - Last Heart Rate in normal range  ?  Pulse Readings from Last 1  Encounters:  ?07/12/21 89  ?   ?  ?  Passed - Valid encounter within last 6 months  ?  Recent Outpatient Visits   ?      ? 1 week ago Abscess of right breast  ? [facility] [place], Odette Horns, MD  ? 1 week ago Hidradenitis  ? [facility] [place], Odette Horns, MD  ? 1 month ago Screening for metabolic disorder  ? [facility] [place], Odette Horns, MD  ? 11 months ago Poor concentration  ? [facility] And Wellness Hoy Register, MD  ? 1 year ago Anxiety and depression  ? [facility] [place], [place], NP  ?  ?  ? ?  ?  ?  ? ? ?

## 2021-07-20 ENCOUNTER — Other Ambulatory Visit: Payer: Self-pay

## 2021-07-20 ENCOUNTER — Other Ambulatory Visit: Payer: Self-pay | Admitting: Family Medicine

## 2021-07-20 DIAGNOSIS — F419 Anxiety disorder, unspecified: Secondary | ICD-10-CM

## 2021-07-20 DIAGNOSIS — F32A Depression, unspecified: Secondary | ICD-10-CM

## 2021-07-20 MED ORDER — HYDROXYZINE HCL 10 MG PO TABS
10.0000 mg | ORAL_TABLET | Freq: Three times a day (TID) | ORAL | 2 refills | Status: DC | PRN
  Filled 2021-07-20: qty 90, 30d supply, fill #0

## 2021-07-20 NOTE — Telephone Encounter (Signed)
Requested Prescriptions  ?Pending Prescriptions Disp Refills  ?? hydrOXYzine (ATARAX) 10 MG tablet 60 tablet 0  ?  Sig: Take 1 tablet (10 mg total) by mouth 3 (three) times daily as needed.  ?  ? Ear, Nose, and Throat:  Antihistamines 2 Passed - 07/20/2021  5:25 PM  ?  ?  Passed - Cr in normal range and within 360 days  ?  Creat  ?Date Value Ref Range Status  ?01/01/2016 0.86 0.50 - 1.10 mg/dL Final  ? ?Creatinine, Ser  ?Date Value Ref Range Status  ?05/23/2021 0.76 0.57 - 1.00 mg/dL Final  ? ?Creatinine, Urine  ?Date Value Ref Range Status  ?05/20/2014 293.00 mg/dL Final  ?   ?  ?  Passed - Valid encounter within last 12 months  ?  Recent Outpatient Visits   ?      ? 1 week ago Abscess of right breast  ? [facility] [employer], Odette Horns, MD  ? 1 week ago Hidradenitis  ? [facility] [place], Odette Horns, MD  ? 1 month ago Screening for metabolic disorder  ? [facility] [place], Odette Horns, MD  ? 11 months ago Poor concentration  ? [facility] And Wellness Hoy Register, MD  ? 1 year ago Anxiety and depression  ? [facility] [place], [place], NP  ?  ?  ? ?  ?  ?  ? ? ?

## 2021-07-23 ENCOUNTER — Other Ambulatory Visit: Payer: Self-pay

## 2021-07-30 ENCOUNTER — Other Ambulatory Visit: Payer: Self-pay

## 2021-07-31 ENCOUNTER — Ambulatory Visit
Admission: RE | Admit: 2021-07-31 | Discharge: 2021-07-31 | Disposition: A | Discharge status: Home / Self Care | Payer: Medicaid Other | Source: Ambulatory Visit | Attending: Obstetrics and Gynecology | Admitting: Obstetrics and Gynecology

## 2021-07-31 ENCOUNTER — Other Ambulatory Visit: Payer: Self-pay

## 2021-07-31 DIAGNOSIS — N611 Abscess of the breast and nipple: Secondary | ICD-10-CM

## 2021-07-31 DIAGNOSIS — N644 Mastodynia: Secondary | ICD-10-CM | POA: Diagnosis not present

## 2021-07-31 DIAGNOSIS — R922 Inconclusive mammogram: Secondary | ICD-10-CM | POA: Diagnosis not present

## 2021-08-25 ENCOUNTER — Encounter: Payer: Self-pay | Admitting: Family Medicine

## 2021-08-25 ENCOUNTER — Other Ambulatory Visit: Payer: Self-pay | Admitting: Family Medicine

## 2021-08-25 DIAGNOSIS — I10 Essential (primary) hypertension: Secondary | ICD-10-CM

## 2021-08-26 MED ORDER — AMLODIPINE BESYLATE 10 MG PO TABS: 10.0000 mg | ORAL_TABLET | Freq: Every day | ORAL | 0 refills | Status: DC

## 2021-08-27 NOTE — Telephone Encounter (Signed)
Duplicate request. ?Requested Prescriptions  ?Pending Prescriptions Disp Refills  ?? amLODipine (NORVASC) 10 MG tablet [Pharmacy Med Name: amLODIPine Besylate 10 MG Oral Tablet] 30 tablet 0  ?  Sig: Take 1 tablet by mouth once daily  ?  ? Cardiovascular: Calcium Channel Blockers 2 Failed - 08/25/2021  3:27 PM  ?  ?  Failed - Last BP in normal range  ?  BP Readings from Last 1 Encounters:  ?07/12/21 (!) 159/98  ?   ?  ?  Passed - Last Heart Rate in normal range  ?  Pulse Readings from Last 1 Encounters:  ?07/12/21 89  ?   ?  ?  Passed - Valid encounter within last 6 months  ?  Recent Outpatient Visits   ?      ? 1 month ago Abscess of right breast  ? [facility] [facility] Well[facility] [place], Odette Horns, MD  ? 1 month ago Hidradenitis  ? [facility] [place], Odette Horns, MD  ? 3 months ago Screening for metabolic disorder  ? [facility] Wellness Hoy Register, MD  ? 1 year ago Poor concentration  ? [facility] And Wellness Hoy Register, MD  ? 1 year ago Anxiety and depression  ? [facility] [place], [place], NP  ?  ?  ? ?  ?  ?  ? ? ?

## 2021-09-02 ENCOUNTER — Other Ambulatory Visit: Payer: Self-pay | Admitting: Physician Assistant

## 2021-09-02 DIAGNOSIS — E559 Vitamin D deficiency, unspecified: Secondary | ICD-10-CM

## 2021-09-06 ENCOUNTER — Telehealth: Payer: Self-pay | Admitting: Pharmacist

## 2021-09-06 NOTE — Telephone Encounter (Signed)
Patient appearing on report for [employer] - Hypertension Control report due to last documented ambulatory blood pressure of 159/98 on 07/12/21. Next appointment with PCP is not scheduled  ? ?Outreached patient to discuss hypertension control and medication management. She was at work, we decided for me to call back tomorrow at 12:30.  ? ?Catie Eppie Gibson, PharmD, BCACP ?[facility] ?[phone] ? ? ? ?

## 2021-09-07 ENCOUNTER — Other Ambulatory Visit: Payer: Self-pay | Admitting: Pharmacist

## 2021-09-07 DIAGNOSIS — I10 Essential (primary) hypertension: Secondary | ICD-10-CM

## 2021-09-07 DIAGNOSIS — F411 Generalized anxiety disorder: Secondary | ICD-10-CM

## 2021-09-07 MED ORDER — BLOOD PRESSURE CUFF MISC: 0 refills | Status: AC

## 2021-09-07 NOTE — Patient Outreach (Signed)
? ?   ? ?Chief Complaint  ?Patient presents with  ? Hypertension  ? ? ?Kelli Wells is a 36 y.o. year old female who was referred for medication management by their primary care provider, Hoy Register, MD. They presented for a telephone visit. ?  ?They were referred to the pharmacist by a quality report for assistance in managing hypertension.  ? ?Subjective: ? ?Care Team: ?Primary Care Provider: Hoy Register, MD ; Next Scheduled Visit: not scheduled ? ?Medication Access/Adherence ? ?Current Pharmacy:  ?[id number] - [place] - [address] ?[address] ?[place] ([place] [address] ?Phone: [phone] Fax: [phone] ? ? ?Patient reports affordability concerns with their medications: No  ?Patient reports access/transportation concerns to their pharmacy: No  ?Patient reports adherence concerns with their medications:  Yes   ? ? ?Hypertension: ? ?Current medications: amlodipine 10 mg daily, carvedilol 25 mg twice daily  ? ? ?Patient does not have a validated, automated, upper arm home BP cuff ? ?Patient denies hypotensive s/sx including dizziness, lightheadedness.  ?Patient reports hypertensive symptoms including periodic headache.  ? ?Anxiety: ? ?Current medications: sertraline 100 mg daily, bupropion XL 150 mg daily, trazodone 50 mg daily  ?Previously tried: fluoxetine, buspirone  ? ?Reports worsening issues with anxiety lately. Worries about her health, had several health complications in the family recently.  ? ?Behavioral Health support: none, but requests information on therapists  ? ?Pain: ?- Reports chronic back pain; requests refill on naproxen and methocarbamol. ? ?Health Maintenance ? ?Health Maintenance Due  ?Topic Date Due  ? COVID-19 Vaccine (1) Never done  ? Hepatitis C Screening  Never done  ?  ? ?Objective: ?Lab Results  ?Component Value Date  ? HGBA1C 5.8 (H) 05/23/2021  ? ? ?Lab Results  ?Component Value Date  ? CREATININE 0.76  05/23/2021  ? BUN 9 05/23/2021  ? NA 140 05/23/2021  ? K 4.1 05/23/2021  ? CL 104 05/23/2021  ? CO2 20 05/23/2021  ? ? ?Lab Results  ?Component Value Date  ? CHOL 183 05/23/2021  ? HDL 41 05/23/2021  ? LDLCALC 127 (H) 05/23/2021  ? TRIG 78 05/23/2021  ? CHOLHDL 3.1 09/26/2017  ? ? ?Medications Reviewed Today   ? ? Reviewed by Alden Hipp, RPH-CPP (Pharmacist) on 09/07/21 at 1241  Med List Status: <None>  ? ?Medication Order Taking? Sig Documenting Provider Last Dose Status Informant  ?albuterol (VENTOLIN HFA) 108 (90 Base) MCG/ACT inhaler [id number] No Inhale 1-2 puffs into the lungs every 6 (six) hours as needed for wheezing or shortness of breath.  ?Patient not taking: Reported on 09/07/2021  ? Hoy Register, MD Not Taking Active   ?amLODipine (NORVASC) 10 MG tablet [id number] Yes Take 1 tablet (10 mg total) by mouth daily. Hoy Register, MD Taking Active   ?buPROPion (WELLBUTRIN XL) 150 MG 24 hr tablet [id number] Yes Take 1 tablet (150 mg total) by mouth every morning. Hoy Register, MD Taking Active   ?carvedilol (COREG) 25 MG tablet [id number] Yes Take 1 tablet (25 mg total) by mouth 2 (two) times daily with a meal. Hoy Register, MD Taking Active   ?clindamycin (CLEOCIN T) 1 % external solution [id number] Yes Apply topically 2 (two) times daily. For hidradenitis Hoy Register, MD Taking Active   ?EPINEPHrine (EPIPEN 2-PAK) 0.3 mg/0.3 mL IJ SOAJ injection [id number]  Inject 0.3 mLs (0.3 mg total) into the muscle as needed for anaphylaxis. Zadie Rhine, MD  Active   ?fluticasone (FLONASE) 50 MCG/ACT nasal  spray [id number] Yes Place 2 sprays into both nostrils daily. Hoy Register, MD Taking Active   ?hydrOXYzine (ATARAX) 10 MG tablet [id number] Yes Take 1 tablet (10 mg total) by mouth 3 (three) times daily as needed. Hoy Register, MD Taking Active   ?ipratropium-albuterol (DUONEB) 0.5-2.5 (3) MG/3ML SOLN [id number] No Take 3 mLs by nebulization every 6 (six) hours as needed.  ?Patient not  taking: Reported on 09/07/2021  ? Wieters, Honor C, PA-C Not Taking Active   ?ipratropium-albuterol (DUONEB) 0.5-2.5 (3) MG/3ML SOLN [id number]  USE 1 AMPULE IN NEBULIZER VIA NEBULIZATION EVERY 6 HOURS AS NEEDED Wieters, Hallie C, PA-C  Expired 03/06/21 2359   ?loratadine (CLARITIN) 10 MG tablet [id number] Yes Take 1 tablet (10 mg total) by mouth daily. Hoy Register, MD Taking Active   ?methocarbamol (ROBAXIN) 500 MG tablet [id number] Yes Take 1 tablet (500 mg total) by mouth every 8 (eight) hours as needed for muscle spasms. Hoy Register, MD Taking Active   ?mupirocin ointment (BACTROBAN) 2 % [id number] No Apply 1 application topically daily.  ?Patient not taking: Reported on 09/07/2021  ? Raspet, Noberto Retort, PA-C Not Taking Active   ?naproxen (NAPROSYN) 500 MG tablet [id number] No Take 1 tablet (500 mg total) by mouth 2 (two) times daily with a meal.  ?Patient not taking: Reported on 09/07/2021  ? Hoy Register, MD Not Taking Active   ?ondansetron (ZOFRAN-ODT) 4 MG disintegrating tablet [id number] No Take 1 tablet (4 mg total) by mouth every 8 (eight) hours as needed for nausea or vomiting.  ?Patient not taking: Reported on 09/07/2021  ? Waldon Merl, PA-C Not Taking Active   ?sertraline (ZOLOFT) 100 MG tablet [id number] Yes Take 1 tablet by mouth once daily Hoy Register, MD Taking Active   ?traZODone (DESYREL) 50 MG tablet [id number] Yes Take 1 tablet (50 mg total) by mouth at bedtime as needed for sleep. Hoy Register, MD Taking Active   ?Vitamin D, Ergocalciferol, (DRISDOL) 1.25 MG (50000 UNIT) CAPS capsule [id number] No Take 1 capsule (50,000 Units total) by mouth every 7 (seven) days.  ?Patient not taking: Reported on 09/07/2021  ? Mayers, Kasandra Knudsen, PA-C Not Taking Active   ? ?  ?  ? ?  ? ? ?Assessment/Plan:  ?Care Plan : Medication Management  ?Updates made by Alden Hipp, RPH-CPP since 09/07/2021 12:00 AM  ?  ? ?Problem: Hypertension   ?  ? ?Goal: BP <130/80   ?  ? ?Problem: Anxiety   ?  ? ?Goal:  Reduced Symptoms and Improved Sleep   ?  ? ? ?Hypertension: ?- Currently uncontrolled ?- Reviewed goal BP <130/80 ?- Reviewed long term cardiovascular and renal outcomes of uncontrolled blood pressure ?- Reviewed appropriate blood pressure monitoring technique and reviewed goal blood pressure. Recommended to check home blood pressure and heart rate daily  ?- Will collaborate with primary care provider office to order home BP cuff. Discussed checking home readings ?- Scheduled with embedded pharmacist in ~ 1 month for follow up. Scheduled with PCP for chronic disease f/u.  ?- Recommend to continue current regimen at this time ?- Reviewed red flag signs/symptoms for need to seek urgent evaluation ? ?Anxiety: ?- Currently uncontrolled ?- Will connect w/ SW resources to discuss connecting with therapy resources ?- Could consider change to duloxetine moving forward for dual mental health and chronic pain benefit  ? ?Follow Up Plan: phone call in 6 weeks ? ?Catie Eppie Gibson, PharmD, BCACP ?[facility] ?[phone] ? ? ?

## 2021-09-07 NOTE — Patient Instructions (Signed)
Ms. Lukacs,  ? ?It was great to talk to you today! ? ?Check your blood pressure 2-3 times weekly. Our goal is less than 130/80 ? ?We recommend a blood pressure cuff that goes around your upper arm, as these are generally the most accurate. We'll send that prescription to [facility]. Call them next week to touch base and pick it up.  ? ?To appropriately check your blood pressure, make sure you do the following:  ?1) Avoid caffeine, exercise, or tobacco products for 30 minutes before checking. ?2) Sit with your back supported in a flat-backed chair. Rest your arm on something flat (arm of the chair, table, etc). ?3) Sit still with your feet flat on the floor, resting, for at least 5 minutes.  ?4) Check your blood pressure. Take 1-2 readings.  ? ?Write down these readings and bring with you to any provider appointments.  ?Bring your home blood pressure machine with you to a provider's office for accuracy comparison at least once a year.  ?Make sure you take your blood pressure medications before you come to any office visit. ? ? ?Take care, and let me know if you have any questions! ? ?Catie Eppie Gibson, PharmD, BCACP ?[facility] ?[phone] ? ?

## 2021-09-10 ENCOUNTER — Other Ambulatory Visit: Payer: Self-pay | Admitting: Pharmacist

## 2021-09-10 DIAGNOSIS — I10 Essential (primary) hypertension: Secondary | ICD-10-CM

## 2021-09-10 NOTE — Chronic Care Management (AMB) (Signed)
Attempted to contact patient to follow up from our call last week. Left voicemail for her to return my call. I will send MyChart message as well.  ? ?Catie Hedwig Morton, PharmD, BCACP ?[facility] ?[phone] ? ?

## 2021-09-12 ENCOUNTER — Other Ambulatory Visit: Payer: Self-pay | Admitting: Physician Assistant

## 2021-09-12 DIAGNOSIS — E559 Vitamin D deficiency, unspecified: Secondary | ICD-10-CM

## 2021-10-02 ENCOUNTER — Other Ambulatory Visit: Payer: Self-pay | Admitting: Licensed Clinical Social Worker

## 2021-10-02 ENCOUNTER — Other Ambulatory Visit: Payer: Self-pay | Admitting: Internal Medicine

## 2021-10-02 DIAGNOSIS — I10 Essential (primary) hypertension: Secondary | ICD-10-CM

## 2021-10-02 NOTE — Patient Instructions (Signed)
Visit Information  Kelli Wells was given information about Medicaid Managed Care team care coordination services as a part of their [place] Medicaid benefit. Kelli Wells verbally consentedto engagement with the [employer] Care team.   If you are experiencing a medical emergency, please call 911 or report to your local emergency department or urgent care.   If you have a non-emergency medical problem during routine business hours, please contact your provider's office and ask to speak with a nurse.   For questions related to your [facility] health plan, please call: [phone]  OR visit the member homepage at: [email or web address]  If you would like to schedule transportation through your [facility] plan, please call the following number at least 2 days in advance of your appointment: [phone]  If you are experiencing a behavioral health crisis, call the [facility] at [phone]). The line is available 24 hours a day, seven days a week.  If you would like help to quit smoking, call 1-800-QUIT-NOW (731)574-6572) OR Espaol: 1-855-Djelo-Ya (6-629-476-5465) o para ms informacin haga clic aqu or Text READY to 200-400 to register via text   Following is a copy of your plan of care:  Care Plan : Kelli Wells Plan of Care  Updates made by Kelli Cutter, Kelli Wells since 10/02/2021 12:00 AM     Problem: Anxiety Identification (Anxiety)      Long-Range Goal: Anxiety Symptoms Identified and Need for Mental Health Support Involvement   Start Date: 10/02/2021  Priority: High  Note:   Priority: High  Timeframe:  Long-Range Goal Priority:  High Start Date:   10/02/21          Expected End Date:  ongoing                     Follow Up Date--10/17/21 at 12:30 pm.   - check out counseling - keep 90 percent of counseling  appointments - schedule counseling appointment    Why is this important?             Beating depression may take some time.            If you don't feel better right away, don't give up on your treatment plan.    Current barriers:   Chronic Mental Health needs related to depression, stress and anxiety. Patient requires Support, Education, Resources, Referrals, Advocacy, and Care Coordination, in order to meet Unmet Mental Health Needs. Patient will implement clinical interventions discussed today to decrease symptoms of depression and increase knowledge and/or ability of: coping skills. Mental Health Concerns and Social Isolation Patient lacks knowledge of available community counseling agencies and resources.  Clinical Goal(s): verbalize understanding of plan for management of Anxiety, Depression, and Stress and demonstrate a reduction in symptoms. Patient will connect with a provider for ongoing mental health treatment, increase coping skills, healthy habits, self-management skills, and stress reduction        Patient Goals/Self-Care Activities: Over the next 120 days Attend scheduled medical appointments Utilize healthy coping skills and supportive resources discussed Contact PCP with any questions or concerns Keep 90 percent of counseling appointments Call your insurance provider for more information about your Enhanced Benefits  Check out counseling resources provided  Begin personal counseling with Kelli Wells, to reduce and manage symptoms of Depression and Stress, until well-established with mental health provider Accept all calls from representative with [facility] in an effort to establish ongoing mental health counseling  and supportive services. Incorporate into daily practice - relaxation techniques, deep breathing exercises, and mindfulness meditation strategies. Talk about feelings with friends, family members, spiritual advisor, etc. Contact Kelli Wells directly [phone]), if you have  questions, need assistance, or if additional social work needs are identified between now and our next scheduled telephone outreach call. Call 988 for mental health hotline/crisis line if needed (24/7 available) Try techniques to reduce symptoms of anxiety/negative thinking (deep breathing, distraction, positive self talk, etc)  - develop a personal safety plan - develop a plan to deal with triggers like holidays, anniversaries - exercise at least 2 to 3 times per week - have a plan for how to handle bad days - journal feelings and what helps to feel better or worse - spend time or talk with others at least 2 to 3 times per week - watch for early signs of feeling worse - begin personal counseling - call and visit an old friend - check out volunteer opportunities - join a support group - laugh; watch a funny movie or comedian - learn and use visualization or guided imagery - perform a random act of kindness - practice relaxation or meditation daily - start or continue a personal journal - practice positive thinking and self-talk -continue with compliance of taking medication  -identify current effective and ineffective coping strategies.  -implement positive self-talk in care to increase self-esteem, confidence and feelings of control.  -consider alternative and complementary therapy approaches such as meditation, mindfulness or yoga.  -journaling, prayer, worship services, meditation or pastoral counseling.  -increase participation in pleasurable group activities such as hobbies, singing, sports or volunteering).  -consider the use of meditative movement therapy such as tai chi, yoga or qigong.  -start a regular daily exercise program based on tolerance, ability and patient choice to support positive thinking and activity     10 LITTLE Things To Do When You're Feeling Too Down To Do Anything  Take a shower. Even if you plan to stay in all day long and not see a soul, take a shower. It  takes the most effort to hop in to the shower but once you do, you'll feel immediate results. It will wake you up and you'll be feeling much fresher (and cleaner too).  Brush and floss your teeth. Give your teeth a good brushing with a floss finish. It's a small task but it feels so good and you can check 'taking care of your health' off the list of things to do.  Do something small on your list. Most of [place] have some small thing we would like to get done (load of laundry, sew a button, email a friend). Doing one of these things will make you feel like you've accomplished something.  Drink water. Drinking water is easy right? It's also really beneficial for your health so keep a glass beside you all day and take sips often. It gives you energy and prevents you from boredom eating.  Do some floor exercises. The last thing you want to do is exercise but it might be just the thing you need the most. Keep it simple and do exercises that involve sitting or laying on the floor. Even the smallest of exercises release chemicals in the brain that make you feel good. Yoga stretches or core exercises are going to make you feel good with minimal effort.  Make your bed. Making your bed takes a few minutes but it's productive and you'll feel relieved when it's done. An unmade  bed is a huge visual reminder that you're having an unproductive day. Do it and consider it your housework for the day.  Put on some nice clothes. Take the sweatpants off even if you don't plan to go anywhere. Put on clothes that make you feel good. Take a look in the mirror so your brain recognizes the sweatpants have been replaced with clothes that make you look great. It's an instant confidence booster.  Wash the dishes. A pile of dirty dishes in the sink is a reflection of your mood. It's possible that if you wash up the dishes, your mood will follow suit. It's worth a try.  Cook a real meal. If you have the luxury to have a "do  nothing" day, you have time to make a real meal for yourself. Make a meal that you love to eat. The process is good to get you out of the funk and the food will ensure you have more energy for tomorrow.  Write out your thoughts by hand. When you hand write, you stimulate your brain to focus on the moment that you're in so make yourself comfortable and write whatever comes into your mind. Put those thoughts out on paper so they stop spinning around in your head. Those thoughts might be the very thing holding you down.   Patient Goals: Initial goal  Kelli Wells, BSW, MSW, [employer] Managed Medicaid Kelli Wells [email or web address] Phone: [phone]

## 2021-10-02 NOTE — Patient Outreach (Signed)
Medicaid Managed Care Social Work Note  10/02/2021 Name:  Kelli Wells MRN:  [id number] DOB:  1985/05/28  Kelli Wells is an 36 y.o. year old female who is a primary patient of Charlott Rakes, MD.  The Medicaid Managed Care Coordination team was consulted for assistance with:  [place] and Resources  Ms. Bendall was given information about Medicaid Managed Care Coordination team services today. [place] Patient agreed to services and verbal consent obtained.  Engaged with patient  for by telephone forinitial visit in response to referral for case management and/or care coordination services.   Assessments/Interventions:  Review of past medical history, allergies, medications, health status, including review of consultants reports, laboratory and other test data, was performed as part of comprehensive evaluation and provision of chronic care management services.  SDOH: (Social Determinant of Health) assessments and interventions performed: SDOH Interventions    Flowsheet Row Most Recent Value  SDOH Interventions   Depression Interventions/Treatment  Referral to Psychiatry       Advanced Directives Status:  See Care Plan for related entries.  Care Plan                 Allergies  Allergen Reactions   Sulfa Antibiotics Anaphylaxis and Swelling   Shellfish Allergy Swelling    Facial swelling     Medications Reviewed Today     Reviewed by Osker Mason, RPH-CPP (Pharmacist) on 09/07/21 at 1241  Med List Status: <None>   Medication Order Taking? Sig Documenting Provider Last Dose Status Informant  albuterol (VENTOLIN HFA) 108 (90 Base) MCG/ACT inhaler [id number] No Inhale 1-2 puffs into the lungs every 6 (six) hours as needed for wheezing or shortness of breath.  Patient not taking: Reported on 09/07/2021   Charlott Rakes, MD Not Taking Active   amLODipine (NORVASC) 10 MG tablet [id number] Yes Take 1 tablet (10 mg total) by  mouth daily. Charlott Rakes, MD Taking Active   buPROPion (WELLBUTRIN XL) 150 MG 24 hr tablet [id number] Yes Take 1 tablet (150 mg total) by mouth every morning. Charlott Rakes, MD Taking Active   carvedilol (COREG) 25 MG tablet [id number] Yes Take 1 tablet (25 mg total) by mouth 2 (two) times daily with a meal. Charlott Rakes, MD Taking Active   clindamycin (CLEOCIN T) 1 % external solution [id number] Yes Apply topically 2 (two) times daily. For hidradenitis Charlott Rakes, MD Taking Active   EPINEPHrine (EPIPEN 2-PAK) 0.3 mg/0.3 mL IJ SOAJ injection [id number]  Inject 0.3 mLs (0.3 mg total) into the muscle as needed for anaphylaxis. Ripley Fraise, MD  Active   fluticasone [facility]) 50 MCG/ACT nasal spray [id number] Yes Place 2 sprays into both nostrils daily. Charlott Rakes, MD Taking Active   hydrOXYzine (ATARAX) 10 MG tablet [id number] Yes Take 1 tablet (10 mg total) by mouth 3 (three) times daily as needed. Charlott Rakes, MD Taking Active   ipratropium-albuterol (DUONEB) 0.5-2.5 (3) MG/3ML SOLN [id number] No Take 3 mLs by nebulization every 6 (six) hours as needed.  Patient not taking: Reported on 09/07/2021   Janith Lima, PA-C Not Taking Active   ipratropium-albuterol (DUONEB) 0.5-2.5 (3) MG/3ML SOLN [id number]  USE 1 AMPULE IN NEBULIZER VIA NEBULIZATION EVERY 6 HOURS AS NEEDED Wieters, Hallie C, PA-C  Expired 03/06/21 2359   loratadine (CLARITIN) 10 MG tablet [id number] Yes Take 1 tablet (10 mg total) by mouth daily. Charlott Rakes, MD Taking Active   methocarbamol (ROBAXIN) 500 MG tablet [id number] Yes Take 1 tablet (500 mg total)  by mouth every 8 (eight) hours as needed for muscle spasms. Charlott Rakes, MD Taking Active   mupirocin ointment (BACTROBAN) 2 % [id number] No Apply 1 application topically daily.  Patient not taking: Reported on 09/07/2021   Raspet, Derry Skill, PA-C Not Taking Active   naproxen (NAPROSYN) 500 MG tablet [id number] No Take 1 tablet (500 mg total) by mouth 2 (two)  times daily with a meal.  Patient not taking: Reported on 09/07/2021   Charlott Rakes, MD Not Taking Active   ondansetron (ZOFRAN-ODT) 4 MG disintegrating tablet [id number] No Take 1 tablet (4 mg total) by mouth every 8 (eight) hours as needed for nausea or vomiting.  Patient not taking: Reported on 09/07/2021   Brunetta Jeans, PA-C Not Taking Active   sertraline (ZOLOFT) 100 MG tablet [id number] Yes Take 1 tablet by mouth once daily Charlott Rakes, MD Taking Active   traZODone (DESYREL) 50 MG tablet [id number] Yes Take 1 tablet (50 mg total) by mouth at bedtime as needed for sleep. Charlott Rakes, MD Taking Active   Vitamin D, Ergocalciferol, (DRISDOL) 1.25 MG (50000 UNIT) CAPS capsule [id number] No Take 1 capsule (50,000 Units total) by mouth every 7 (seven) days.  Patient not taking: Reported on 09/07/2021   [place] Constable Not Taking Active             Patient Active Problem List   Diagnosis Date Noted   Stress 01/10/2021   Psychophysiological insomnia 01/10/2021   Abnormal cervical Papanicolaou smear 01/09/2021   Asthma    Hypertension 12/14/2015   Anxiety and depression 12/14/2015   Cholecystitis with cholelithiasis 12/17/2014   Non-reactive NST (non-stress test)    Esophageal reflux 02/02/2013   Anxiety state 05/02/2011   Attention deficit hyperactivity disorder (ADHD) 05/02/2011    Conditions to be addressed/monitored per PCP order:  Anxiety and Depression  Care Plan : LCSW Plan of Care  Updates made by Greg Cutter, LCSW since 10/02/2021 12:00 AM     Problem: Anxiety Identification (Anxiety)      Long-Range Goal: Anxiety Symptoms Identified and Need for Mental Health Support Involvement   Start Date: 10/02/2021  Priority: High  Note:   Priority: High  Timeframe:  Long-Range Goal Priority:  High Start Date:   10/02/21          Expected End Date:  ongoing                     Follow Up Date--10/17/21 at 12:30 pm.   - check out counseling - keep 90  percent of counseling appointments - schedule counseling appointment    Why is this important?             Beating depression may take some time.            If you don't feel better right away, don't give up on your treatment plan.    Current barriers:   Chronic Mental Health needs related to depression, stress and anxiety. Patient requires Support, Education, Resources, Referrals, Advocacy, and Care Coordination, in order to meet Unmet Mental Health Needs. Patient will implement clinical interventions discussed today to decrease symptoms of depression and increase knowledge and/or ability of: coping skills. Mental Health Concerns and Social Isolation Patient lacks knowledge of available community counseling agencies and resources.  Clinical Goal(s): verbalize understanding of plan for management of Anxiety, Depression, and Stress and demonstrate a reduction in symptoms. Patient will connect with a provider for ongoing mental health treatment, increase  coping skills, healthy habits, self-management skills, and stress reduction        Clinical Interventions:  Assessed patient's previous and current treatment, coping skills, support system and barriers to care. Patient has NO support network. She was encouraged to consider finding one source of support within her community as building a support network will be helpful at this time to gain socialization and positive support.  Verbalization of feelings encouraged, motivational interviewing employed Emotional support provided, positive coping strategies explored Self care/establishing healthy boundaries emphasized Patient reports that she is on Wellbutrin but would like to gain psychiatry and counseling as she feels her anxiety and depression is still unmanaged. Patient has poor blood pressure and health due to unmanaged anxiety per [facility] Pharmacist.  Patient reports significant worsening anxiety impacting their ability to function appropriately and  carry out daily task. Patient is agreeable to [facility] to [facility] both [facility] and counseling. Referral placed on 10/02/21. Email was sent to patient with this information as well as other mental health agencies within her area that accept Medicaid. Patient was advised to contact [facility] if she does not hear from them by this Friday. [facility] Pharmacist was updated.  LCSW provided education on relaxation techniques such as meditation, deep breathing, massage, grounding exercises or yoga that can activate the body's relaxation response and ease symptoms of stress and anxiety. LCSW ask that when pt is struggling with difficult emotions and racing thoughts that they start this relaxation response process. LCSW provided extensive education on healthy coping skills for anxiety. SW used active and reflective listening, validated patient's feelings/concerns, and provided emotional support. Patient will work on implementing appropriate self-care habits into their daily routine such as: staying positive, writing a gratitude list, drinking water, staying active around the house, taking their medications as prescribed, combating negative thoughts or emotions and staying connected with their family and friends. Positive reinforcement provided for this decision to work on this. Patient was receptive to anxiety and depression management coping skill education. LCSW provided education on healthy sleep hygiene and what that looks like. LCSW encouraged patient to implement a night time routine into their schedule that works best for them and that they are able to maintain. Advised patient to implement deep breathing/grounding/meditation/self-care exercises into their nightly routine to combat racing thoughts at night. LCSW encouraged patient to wake up at the same time each day, make their sleeping environment comfortable, exercise when able, to limit naps and to not eat or drink anything right before  bed.  Motivational Interviewing employed Depression screen reviewed  PHQ2/ PHQ9 completed Mindfulness or Relaxation training provided Active listening / Reflection utilized  Advance Care and HCPOA education provided Emotional Support Provided Problem [place] strategies reviewed Provided psychoeducation for mental health needs  Provided brief CBT  Reviewed mental health medications and discussed importance of compliance:  Quality of sleep assessed & Sleep Hygiene techniques promoted  Participation in counseling encouraged  Verbalization of feelings encouraged  Suicidal Ideation/Homicidal Ideation assessed: Patient denies SI/HI  Review resources, discussed options and provided patient information about  [place] care team collaboration (see longitudinal plan of care) Patient Goals/Self-Care Activities: Over the next 120 days Attend scheduled medical appointments Utilize healthy coping skills and supportive resources discussed Contact PCP with any questions or concerns Keep 90 percent of counseling appointments Call your insurance provider for more information about your Enhanced Benefits  Check out counseling resources provided  Begin personal counseling with LCSW, to reduce and manage  symptoms of Depression and Stress, until well-established with mental health provider Accept all calls from representative with [facility] in an effort to establish ongoing mental health counseling and supportive services. Incorporate into daily practice - relaxation techniques, deep breathing exercises, and mindfulness meditation strategies. Talk about feelings with friends, family members, spiritual advisor, etc. Contact LCSW directly [phone]), if you have questions, need assistance, or if additional social work needs are identified between now and our next scheduled telephone outreach call. Call [phone] for mental health hotline/crisis line if needed (24/7  available) Try techniques to reduce symptoms of anxiety/negative thinking (deep breathing, distraction, positive self talk, etc)  - develop a personal safety plan - develop a plan to deal with triggers like holidays, anniversaries - exercise at least 2 to 3 times per week - have a plan for how to handle bad days - journal feelings and what helps to feel better or worse - spend time or talk with others at least 2 to 3 times per week - watch for early signs of feeling worse - begin personal counseling - call and visit an old friend - check out volunteer opportunities - join a support group - laugh; watch a funny movie or comedian - learn and use visualization or guided imagery - perform a random act of kindness - practice relaxation or meditation daily - start or continue a personal journal - practice positive thinking and self-talk -continue with compliance of taking medication  -identify current effective and ineffective coping strategies.  -implement positive self-talk in care to increase self-esteem, confidence and feelings of control.  -consider alternative and complementary therapy approaches such as meditation, mindfulness or yoga.  -journaling, prayer, worship services, meditation or pastoral counseling.  -increase participation in pleasurable group activities such as hobbies, singing, sports or volunteering).  -consider the use of meditative movement therapy such as tai chi, yoga or qigong.  -start a regular daily exercise program based on tolerance, ability and patient choice to support positive thinking and activity     10 LITTLE Things To Do When You're Feeling Too Down To Do Anything  Take a shower. Even if you plan to stay in all day long and not see a soul, take a shower. It takes the most effort to hop in to the shower but once you do, you'll feel immediate results. It will wake you up and you'll be feeling much fresher (and cleaner too).  Brush and floss your  teeth. Give your teeth a good brushing with a floss finish. It's a small task but it feels so good and you can check 'taking care of your health' off the list of things to do.  Do something small on your list. Most of [place] have some small thing we would like to get done (load of laundry, sew a button, email a friend). Doing one of these things will make you feel like you've accomplished something.  Drink water. Drinking water is easy right? It's also really beneficial for your health so keep a glass beside you all day and take sips often. It gives you energy and prevents you from boredom eating.  Do some floor exercises. The last thing you want to do is exercise but it might be just the thing you need the most. Keep it simple and do exercises that involve sitting or laying on the floor. Even the smallest of exercises release chemicals in the brain that make you feel good. Yoga stretches or core exercises are going to make you feel  good with minimal effort.  Make your bed. Making your bed takes a few minutes but it's productive and you'll feel relieved when it's done. An unmade bed is a huge visual reminder that you're having an unproductive day. Do it and consider it your housework for the day.  Put on some nice clothes. Take the sweatpants off even if you don't plan to go anywhere. Put on clothes that make you feel good. Take a look in the mirror so your brain recognizes the sweatpants have been replaced with clothes that make you look great. It's an instant confidence booster.  Wash the dishes. A pile of dirty dishes in the sink is a reflection of your mood. It's possible that if you wash up the dishes, your mood will follow suit. It's worth a try.  Cook a real meal. If you have the luxury to have a "do nothing" day, you have time to make a real meal for yourself. Make a meal that you love to eat. The process is good to get you out of the funk and the food will ensure you have more energy for  tomorrow.  Write out your thoughts by hand. When you hand write, you stimulate your brain to focus on the moment that you're in so make yourself comfortable and write whatever comes into your mind. Put those thoughts out on paper so they stop spinning around in your head. Those thoughts might be the very thing holding you down.     10/02/2021   12:58 PM 07/10/2021    3:08 PM 04/09/2021   11:46 AM 02/26/2021    4:35 PM 01/09/2021   10:13 AM  Depression screen PHQ 2/9  Decreased Interest 1 1 1 2 3   Down, Depressed, Hopeless 1 1 1 2 3   PHQ - 2 Score 2 2 2 4 6   Altered sleeping 3 3 3 3 3   Tired, decreased energy 3 3 2 3 3   Change in appetite 3 3 2 2 3   Feeling bad or failure about yourself  0 0 1 2 3   Trouble concentrating 1 2 2 1 3   Moving slowly or fidgety/restless 0 3 2 3 3   Suicidal thoughts 0 0 0 0 3  PHQ-9 Score 12 16 14 18 27   Difficult doing work/chores Somewhat difficult          24- Hour Availability:    [facility]  [address] [place], [place] Crisis [phone]   [facility] [employer] [phone]   [place]  [phone]    [place]  [phone] (after hours)   Therapeutic Alternative/Mobile Crisis   [phone]   [place] National Suicide Hotline  419-829-3103 (TALK) OR 988   Call 911 or go to emergency room   [facility]  [phone]);  [place] and [employer]  [phone]); [place], [place], [place], [place], [place], [place], [place]         Patient Goals: Initial goal      Follow up:  Patient agrees to Care Plan and Follow-up.  Plan: The Managed Medicaid care management team will reach out to the patient again over the next 30 days.  Date/time of next scheduled Social Work care management/care coordination outreach:  10/17/21 at 12:30 pm.  Eula Fried, BSW, MSW, [place] Medicaid LCSW [email or web address] Phone: [phone]

## 2021-10-03 NOTE — Telephone Encounter (Signed)
Requested Prescriptions  Pending Prescriptions Disp Refills  . amLODipine (NORVASC) 10 MG tablet [Pharmacy Med Name: amLODIPine Besylate 10 MG Oral Tablet] 30 tablet 0    Sig: Take 1 tablet by mouth once daily     Cardiovascular: Calcium Channel Blockers 2 Failed - 10/02/2021  1:35 PM      Failed - Last BP in normal range    BP Readings from Last 1 Encounters:  07/12/21 (!) 159/98         Passed - Last Heart Rate in normal range    Pulse Readings from Last 1 Encounters:  07/12/21 89         Passed - Valid encounter within last 6 months    Recent Outpatient Visits          2 months ago Abscess of right breast   [facility] Hoy Register, MD   2 months ago Hidradenitis   [facility] [place], Odette Horns, MD   4 months ago Screening for metabolic disorder   [facility] Hoy Register, MD   1 year ago Poor concentration   [facility] Hoy Register, MD   1 year ago Anxiety and depression   [facility] Rema Fendt, NP      Future Appointments            In 1 week Lois Huxley, Cornelius Moras, RPH-CPP  [facility]   In 3 months Hoy Register, MD [facility]

## 2021-10-04 ENCOUNTER — Encounter: Payer: Self-pay | Admitting: Family Medicine

## 2021-10-05 ENCOUNTER — Telehealth: Payer: Self-pay | Admitting: Family Medicine

## 2021-10-05 NOTE — Telephone Encounter (Signed)
Pt has history with depression wants provide to document this on paper to give to pt employer asap. Pt says that the depression is hard to deal with some days. Pt has a telephone encounter 6/5 to discuss this matter further. Please contact pt with an update thank you!

## 2021-10-09 NOTE — Telephone Encounter (Signed)
Patient is requesting letter

## 2021-10-10 ENCOUNTER — Ambulatory Visit: Payer: Medicaid Other | Admitting: Family Medicine

## 2021-10-10 NOTE — Telephone Encounter (Signed)
Pt was called and a VM was left informing patient that letter is ready for print via her mychart.

## 2021-10-10 NOTE — Telephone Encounter (Signed)
Done

## 2021-10-11 ENCOUNTER — Ambulatory Visit: Payer: Medicaid Other | Admitting: Pharmacist

## 2021-10-15 ENCOUNTER — Ambulatory Visit: Payer: Medicaid Other | Attending: Family Medicine | Admitting: Family Medicine

## 2021-10-15 DIAGNOSIS — L708 Other acne: Secondary | ICD-10-CM

## 2021-10-15 DIAGNOSIS — F32A Depression, unspecified: Secondary | ICD-10-CM

## 2021-10-15 DIAGNOSIS — F419 Anxiety disorder, unspecified: Secondary | ICD-10-CM

## 2021-10-15 DIAGNOSIS — R29818 Other symptoms and signs involving the nervous system: Secondary | ICD-10-CM | POA: Diagnosis not present

## 2021-10-15 NOTE — Progress Notes (Signed)
Virtual Visit via Telephone Note  I connected with Kelli Wells, on 10/15/2021 at 1:32 PM by telephone and verified that I am speaking with the correct person using two identifiers.   Consent: I discussed the limitations, risks, security and privacy concerns of performing an evaluation and management service by telephone and the availability of in person appointments. I also discussed with the patient that there may be a patient responsible charge related to this service. The patient expressed understanding and agreed to proceed.   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 Alvis LemmingsNewlin     History of Present Illness: Kelli Wells is a 36 y.o. year old female with a history of hypertension, anxiety and depression, hidradenitis.   Today she informs me she is not sure if her anxiety and depression are acting up.  She endorses adherence with Wellbutrin, trazodone, hydroxyzine, Zoloft does [facility] She sleeps a lot and is fatgued, has no energy and sometimes does not feell like doing anything She struggles to wake up, sometimes wakes up at about 7 AM and still feels like she would benefit from additional sleep.  When she does not take her trazodone she finds it difficult to fall asleep.  Endorses snoring, sometimes has headache when she is stressed.  She has a hard time with caring for her kids by herself as she split with her kids Dad in the fall of last year and this took a toll on her and she states she has so much on her mind. She is concerned that she is not able to get some time off work for mental health days and does not want to lose her job as a result of her anxiety and depression. Requesting referral to dermatology for evaluation of acne which she has on her face. Past Medical History:  Diagnosis Date   Anxiety    Asthma     Chronic hypertension in pregnancy    Depression    GERD (gastroesophageal reflux disease)    Hypertension    Infection    UTI   Pregnancy induced hypertension    Vaginal Pap smear, abnormal    LEEP, normal since   Allergies  Allergen Reactions   Sulfa Antibiotics Anaphylaxis and Swelling   Shellfish Allergy Swelling    Facial swelling     Current Outpatient Medications on File Prior to Visit  Medication Sig Dispense Refill   albuterol (VENTOLIN HFA) 108 (90 Base) MCG/ACT inhaler Inhale 1-2 puffs into the lungs every 6 (six) hours as needed for wheezing or shortness of breath. (Patient not taking: Reported on 09/07/2021) 18 g 0   amLODipine (NORVASC) 10 MG tablet Take 1 tablet by mouth once daily 30 tablet 0   Blood Pressure Monitoring (BLOOD PRESSURE CUFF) MISC Use to check blood pressure once daily. 1 each 0   buPROPion (WELLBUTRIN XL) 150 MG 24 hr tablet Take 1 tablet (150 mg total) by mouth every morning. 30 tablet 6   carvedilol (COREG) 25 MG tablet Take 1 tablet (25 mg total) by mouth 2 (two) times daily with a meal. 60 tablet 6   clindamycin (CLEOCIN T) 1 % external solution Apply topically 2 (two) times daily. For hidradenitis 60 mL 1   EPINEPHrine (EPIPEN 2-PAK) 0.3 mg/0.3 mL IJ SOAJ injection Inject 0.3 mLs (0.3 mg total) into the muscle as needed for anaphylaxis. 1 each 1  fluticasone (FLONASE) 50 MCG/ACT nasal spray Place 2 sprays into both nostrils daily. 16 g 1   hydrOXYzine (ATARAX) 10 MG tablet Take 1 tablet (10 mg total) by mouth 3 (three) times daily as needed. 90 tablet 2   ipratropium-albuterol (DUONEB) 0.5-2.5 (3) MG/3ML SOLN Take 3 mLs by nebulization every 6 (six) hours as needed. (Patient not taking: Reported on 09/07/2021) 120 mL 0   ipratropium-albuterol (DUONEB) 0.5-2.5 (3) MG/3ML SOLN USE 1 AMPULE IN NEBULIZER VIA NEBULIZATION EVERY 6 HOURS AS NEEDED 360 mL 0   loratadine (CLARITIN) 10 MG tablet Take 1 tablet (10 mg total) by mouth daily. 30 tablet 1    methocarbamol (ROBAXIN) 500 MG tablet Take 1 tablet (500 mg total) by mouth every 8 (eight) hours as needed for muscle spasms. 90 tablet 1   mupirocin ointment (BACTROBAN) 2 % Apply 1 application topically daily. (Patient not taking: Reported on 09/07/2021) 22 g 0   naproxen (NAPROSYN) 500 MG tablet Take 1 tablet (500 mg total) by mouth 2 (two) times daily with a meal. (Patient not taking: Reported on 09/07/2021) 60 tablet 6   ondansetron (ZOFRAN-ODT) 4 MG disintegrating tablet Take 1 tablet (4 mg total) by mouth every 8 (eight) hours as needed for nausea or vomiting. (Patient not taking: Reported on 09/07/2021) 20 tablet 0   sertraline (ZOLOFT) 100 MG tablet Take 1 tablet by mouth once daily 30 tablet 2   traZODone (DESYREL) 50 MG tablet Take 1 tablet (50 mg total) by mouth at bedtime as needed for sleep. 30 tablet 6   Vitamin D, Ergocalciferol, (DRISDOL) 1.25 MG (50000 UNIT) CAPS capsule Take 1 capsule (50,000 Units total) by mouth every 7 (seven) days. (Patient not taking: Reported on 09/07/2021) 4 capsule 2   No current facility-administered medications on file prior to visit.    ROS: See HPI  Observations/Objective: Awake, alert, oriented x3 Not in acute distress Normal mood      Latest Ref Rng & Units 05/23/2021   10:33 AM 08/02/2020   10:01 AM 06/21/2019    4:59 PM  CMP  Glucose 70 - 99 mg/dL 88   568   76    BUN 6 - 20 mg/dL 9   8   7     Creatinine 0.57 - 1.00 mg/dL   6.16   8.37    Sodium 134 - 144 mmol/L 140   140   135    Potassium 3.5 - 5.2 mmol/L 4.1   4.3   4.3    Chloride 96 - 106 mmol/L 104   104   102    CO2 20 - 29 mmol/L 20   19   20     Calcium 8.7 - 10.2 mg/dL 9.3   9.6   9.5    Total Protein 6.0 - 8.5 g/dL 6.5    6.8    Total Bilirubin 0.0 - 1.2 mg/dL 2.90    0.2    Alkaline Phos 44 - 121 IU/L 109    104    AST 0 - 40 IU/L 12    9    ALT 0 - 32 IU/L 16    11      Lipid Panel     Component Value Date/Time   CHOL 183 05/23/2021 1033   TRIG 78 05/23/2021  1033   HDL 41 05/23/2021 1033   CHOLHDL 3.1 09/26/2017 0906   CHOLHDL 3.4 01/01/2016 1020   VLDL 10 01/01/2016 1020   LDLCALC 127 (H) 05/23/2021 1033  LABVLDL 15 05/23/2021 1033    Lab Results  Component Value Date   HGBA1C 5.8 (H) 05/23/2021    Lab Results  Component Value Date   TSH 1.170 01/09/2021    Assessment and Plan: 1. Anxiety and depression Her anxiety and depression seem to be triggered by her stressors at home Denies suicidal ideation or intent She seems to be doing okay on her current medication regimen and will benefit from psychotherapy, stress management Keep [facility]. Her difficulty waking up could be more of oral medication rather than depressive symptoms.  Advised to take either trazodone or hydroxyzine at night milliliters at 9 PM and not to take both to prevent a hangover in the morning  2. Suspected sleep apnea She would like to be evaluated for sleep apnea - Split night study; Future  3. Other acne - Ambulatory referral to Dermatology   Follow Up Instructions: Keep upcoming appointment   I discussed the assessment and treatment plan with the patient. The patient was provided an opportunity to ask questions and all were answered. The patient agreed with the plan and demonstrated an understanding of the instructions.   The patient was advised to call back or seek an in-person evaluation if the symptoms worsen or if the condition fails to improve as anticipated.     I provided 15 minutes total of non-face-to-face time during this encounter.   Hoy Register, MD, FAAFP. [facility] [place], [place] [phone]   10/15/2021, 1:32 PM

## 2021-10-16 ENCOUNTER — Other Ambulatory Visit: Payer: Self-pay

## 2021-10-17 ENCOUNTER — Other Ambulatory Visit: Payer: Self-pay | Admitting: Licensed Clinical Social Worker

## 2021-10-17 ENCOUNTER — Encounter: Payer: Self-pay | Admitting: Family Medicine

## 2021-10-17 NOTE — Patient Outreach (Signed)
Medicaid Managed Care Social Work Note  10/17/2021 Name:  Kelli Wells MRN:  [id number] DOB:  09-Jun-1985  Kelli Wells is an 36 y.o. year old female who is a primary patient of Charlott Rakes, MD.  The Medicaid Managed Care Coordination team was consulted for assistance with:  Kelli and Resources  Ms. Hogate was given information about Medicaid Managed Care Coordination team services today. [place] Wells Patient agreed to services and verbal consent obtained.  Engaged with patient  for by telephone forfollow up visit in response to referral for case management and/or care coordination services.   Assessments/Interventions:  Review of past medical history, allergies, medications, health status, including review of consultants reports, laboratory and other test data, was performed as part of comprehensive evaluation and provision of chronic care management services.  SDOH: (Social Determinant of Health) assessments and interventions performed: SDOH Interventions    Flowsheet Row Most Recent Value  SDOH Interventions   Stress Interventions Offered [employer], Provide Counseling       Advanced Directives Status:  See Care Plan for related entries.  Care Plan                 Allergies  Allergen Reactions   Sulfa Antibiotics Anaphylaxis and Swelling   Shellfish Allergy Swelling    Facial swelling     Medications Reviewed Today     Reviewed by Greg Cutter, LCSW (Social Worker) on 10/17/21 at [place] List Status: <None>   Medication Order Taking? Sig Documenting Provider Last Dose Status Informant  albuterol (VENTOLIN HFA) 108 (90 Base) MCG/ACT inhaler [id number] No Inhale 1-2 puffs into the lungs every 6 (six) hours as needed for wheezing or shortness of breath.  Patient not taking: Reported on 09/07/2021   Charlott Rakes, MD Not Taking Active   amLODipine (NORVASC) 10 MG tablet [id number]  Take 1 tablet by  mouth once daily Charlott Rakes, MD  Active   Blood Pressure Monitoring (BLOOD PRESSURE CUFF) MISC [id number]  Use to check blood pressure once daily. Charlott Rakes, MD  Active   buPROPion (WELLBUTRIN XL) 150 MG 24 hr tablet [id number] No Take 1 tablet (150 mg total) by mouth every morning. Charlott Rakes, MD Taking Active   carvedilol (COREG) 25 MG tablet [id number] No Take 1 tablet (25 mg total) by mouth 2 (two) times daily with a meal. Charlott Rakes, MD Taking Active   clindamycin (CLEOCIN T) 1 % external solution [id number] No Apply topically 2 (two) times daily. For hidradenitis Charlott Rakes, MD Taking Active   EPINEPHrine (EPIPEN 2-PAK) 0.3 mg/0.3 mL IJ SOAJ injection [id number] No Inject 0.3 mLs (0.3 mg total) into the muscle as needed for anaphylaxis. Ripley Fraise, MD Taking Active   fluticasone [facility]) 50 MCG/ACT nasal spray [id number] No Place 2 sprays into both nostrils daily. Charlott Rakes, MD Taking Active   hydrOXYzine (ATARAX) 10 MG tablet [id number] No Take 1 tablet (10 mg total) by mouth 3 (three) times daily as needed. Charlott Rakes, MD Taking Active   ipratropium-albuterol (DUONEB) 0.5-2.5 (3) MG/3ML SOLN [id number] No Take 3 mLs by nebulization every 6 (six) hours as needed.  Patient not taking: Reported on 09/07/2021   Janith Lima, PA-C Not Taking Active   ipratropium-albuterol (DUONEB) 0.5-2.5 (3) MG/3ML SOLN [id number] No USE 1 AMPULE IN NEBULIZER VIA NEBULIZATION EVERY 6 HOURS AS NEEDED Wieters, Hallie C, PA-C Taking Expired 03/06/21 2359   loratadine (CLARITIN) 10 MG tablet [id number] No Take 1 tablet (10  mg total) by mouth daily. Charlott Rakes, MD Taking Active   methocarbamol (ROBAXIN) 500 MG tablet [id number] No Take 1 tablet (500 mg total) by mouth every 8 (eight) hours as needed for muscle spasms. Charlott Rakes, MD Taking Active   mupirocin ointment (BACTROBAN) 2 % [id number] No Apply 1 application topically daily.  Patient not taking: Reported on  09/07/2021   Raspet, Derry Skill, PA-C Not Taking Active   naproxen (NAPROSYN) 500 MG tablet [id number] No Take 1 tablet (500 mg total) by mouth 2 (two) times daily with a meal.  Patient not taking: Reported on 09/07/2021   Charlott Rakes, MD Not Taking Active   ondansetron (ZOFRAN-ODT) 4 MG disintegrating tablet [id number] No Take 1 tablet (4 mg total) by mouth every 8 (eight) hours as needed for nausea or vomiting.  Patient not taking: Reported on 09/07/2021   Brunetta Jeans, PA-C Not Taking Active   sertraline (ZOLOFT) 100 MG tablet [id number] No Take 1 tablet by mouth once daily Charlott Rakes, MD Taking Active   traZODone (DESYREL) 50 MG tablet [id number] No Take 1 tablet (50 mg total) by mouth at bedtime as needed for sleep. Charlott Rakes, MD Taking Active   Vitamin D, Ergocalciferol, (DRISDOL) 1.25 MG (50000 UNIT) CAPS capsule [id number] No Take 1 capsule (50,000 Units total) by mouth every 7 (seven) days.  Patient not taking: Reported on 09/07/2021   [place] Con[place] Not Taking Active             Patient Active Problem List   Diagnosis Date Noted   Stress 01/10/2021   Psychophysiological insomnia 01/10/2021   Abnormal cervical Papanicolaou smear 01/09/2021   Asthma    Hypertension 12/14/2015   Anxiety and depression 12/14/2015   Cholecystitis with cholelithiasis 12/17/2014   Non-reactive NST (non-stress test)    Esophageal reflux 02/02/2013   Anxiety state 05/02/2011   Attention deficit hyperactivity disorder (ADHD) 05/02/2011    Conditions to be addressed/monitored per PCP order:  Anxiety and Depression  Care Plan : LCSW Plan of Care  Updates made by Greg Cutter, LCSW since 10/17/2021 12:00 AM     Problem: Anxiety Identification (Anxiety)      Long-Range Goal: Anxiety Symptoms Identified and Need for Mental Health Support Involvement   Start Date: 10/02/2021  Priority: High  Note:   Priority: High  Timeframe:  Long-Range Goal Priority:  High Start Date:    10/02/21          Expected End Date:  ongoing                     Follow Up Date--11/19/21 at 12:30 pm.   - check out counseling - keep 90 percent of counseling appointments - schedule counseling appointment    Why is this important?             Beating depression may take some time.            If you don't feel better right away, don't give up on your treatment plan.    Current barriers:   Chronic Mental Health needs related to depression, stress and anxiety. Patient requires Support, Education, Resources, Referrals, Advocacy, and Care Coordination, in order to meet Unmet Mental Health Needs. Patient will implement clinical interventions discussed today to decrease symptoms of depression and increase knowledge and/or ability of: coping skills. Mental Health Concerns and Social Isolation Patient lacks knowledge of available community counseling agencies and resources.  Clinical Goal(s): verbalize understanding of plan  for management of Anxiety, Depression, and Stress and demonstrate a reduction in symptoms. Patient will connect with a provider for ongoing mental health treatment, increase coping skills, healthy habits, self-management skills, and stress reduction        Clinical Interventions:  Assessed patient's previous and current treatment, coping skills, support system and barriers to care. Patient has NO support network. She was encouraged to consider finding one source of support within her community as building a support network will be helpful at this time to gain socialization and positive support.  Verbalization of feelings encouraged, motivational interviewing employed Emotional support provided, positive coping strategies explored Self care/establishing healthy boundaries emphasized Patient reports that she is on Wellbutrin but would like to gain psychiatry and counseling as she feels her anxiety and depression is still unmanaged. Patient has poor blood pressure and health due to  unmanaged anxiety per [facility] Pharmacist.  Patient reports significant worsening anxiety impacting their ability to function appropriately and carry out daily task. Patient is agreeable to [facility] both [facility] and counseling. Referral placed on 10/02/21. Email was sent to patient with this information as well as other mental health agencies within her area that accept Medicaid. Patient was advised to contact [facility] if she does not hear from them by this Friday. [facility] Pharmacist was updated.  LCSW provided education on relaxation techniques such as meditation, deep breathing, massage, grounding exercises or yoga that can activate the body's relaxation response and ease symptoms of stress and anxiety. LCSW ask that when pt is struggling with difficult emotions and racing thoughts that they start this relaxation response process. LCSW provided extensive education on healthy coping skills for anxiety. SW used active and reflective listening, validated patient's feelings/concerns, and provided emotional support. Patient will work on implementing appropriate self-care habits into their daily routine such as: staying positive, writing a gratitude list, drinking water, staying active around the house, taking their medications as prescribed, combating negative thoughts or emotions and staying connected with their family and friends. Positive reinforcement provided for this decision to work on this. Patient was receptive to anxiety and depression management coping skill education. LCSW provided education on healthy sleep hygiene and what that looks like. LCSW encouraged patient to implement a night time routine into their schedule that works best for them and that they are able to maintain. Advised patient to implement deep breathing/grounding/meditation/self-care exercises into their nightly routine to combat racing thoughts at night. LCSW encouraged patient to wake up at the same time each  day, make their sleeping environment comfortable, exercise when able, to limit naps and to not eat or drink anything right before bed.  Motivational Interviewing employed Depression screen reviewed  PHQ2/ PHQ9 completed Mindfulness or Relaxation training provided Active listening / Reflection utilized  Advance Care and HCPOA education provided Emotional Support Provided Problem [place] strategies reviewed Provided psychoeducation for mental health needs  Provided brief CBT  Reviewed mental health medications and discussed importance of compliance:  Quality of sleep assessed & Sleep Hygiene techniques promoted  Participation in counseling encouraged  Verbalization of feelings encouraged  Suicidal Ideation/Homicidal Ideation assessed: Patient denies SI/HI  Review resources, discussed options and provided patient information about  Justice care team collaboration (see longitudinal plan of care) UPDATE 10/17/21- Patient was educated on healthy coping skills to implement into her daily life to combat anxiety and depression. Patient successfully scheduled both counseling and psychiatry appointments at [facility] for [place]. Patient is  unable to state the dates of these appointments but reports that they are scheduled for this month and she is hopeful that work will allow her time off to complete these. [facility] LCSW will follow up next month to ensure that patient was successfully well established with a long term mental health provider. Patient reports ongoing issues with insomnia and need for relief.  Patient Goals/Self-Care Activities: Over the next 120 days Attend scheduled medical appointments Utilize healthy coping skills and supportive resources discussed Contact PCP with any questions or concerns Keep 90 percent of counseling appointments Call your insurance provider for more information about your Enhanced Benefits  Check out counseling  resources provided  Begin personal counseling with LCSW, to reduce and manage symptoms of Depression and Stress, until well-established with mental health provider Accept all calls from representative with [facility] in an effort to establish ongoing mental health counseling and supportive services. Incorporate into daily practice - relaxation techniques, deep breathing exercises, and mindfulness meditation strategies. Talk about feelings with friends, family members, spiritual advisor, etc. Contact LCSW directly [phone]), if you have questions, need assistance, or if additional social work needs are identified between now and our next scheduled telephone outreach call. Call [phone] for mental health hotline/crisis line if needed (24/7 available) Try techniques to reduce symptoms of anxiety/negative thinking (deep breathing, distraction, positive self talk, etc)  - develop a personal safety plan - develop a plan to deal with triggers like holidays, anniversaries - exercise at least 2 to 3 times per week - have a plan for how to handle bad days - journal feelings and what helps to feel better or worse - spend time or talk with others at least 2 to 3 times per week - watch for early signs of feeling worse - begin personal counseling - call and visit an old friend - check out volunteer opportunities - join a support group - laugh; watch a funny movie or comedian - learn and use visualization or guided imagery - perform a random act of kindness - practice relaxation or meditation daily - start or continue a personal journal - practice positive thinking and self-talk -continue with compliance of taking medication  -identify current effective and ineffective coping strategies.  -implement positive self-talk in care to increase self-esteem, confidence and feelings of control.  -consider alternative and complementary therapy approaches such as meditation, mindfulness or yoga.  -journaling, prayer,  worship services, meditation or pastoral counseling.  -increase participation in pleasurable group activities such as hobbies, singing, sports or volunteering).  -consider the use of meditative movement therapy such as tai chi, yoga or qigong.  -start a regular daily exercise program based on tolerance, ability and patient choice to support positive thinking and activity     The following coping skill education was provided for stress relief and mental health management: "When your car dies or a deadline looms, how do you respond? Long-term, low-grade or acute stress takes a serious toll on your body and mind, so don't ignore feelings of constant tension. Stress is a natural part of life. However, too much stress can harm our health, especially if it continues every day. This is chronic stress and can put you at risk for heart problems like heart disease and depression. Understand what's happening inside your body and learn simple coping skills to combat the negative impacts of everyday stressors.  Types of Stress There are two types of stress: Emotional - types of emotional stress are relationship problems, pressure at work, financial worries,  experiencing discrimination or having a major life change. Physical - Examples of physical stress include being sick having pain, not sleeping well, recovery from an injury or having an alcohol and drug use disorder. Fight or Flight Sudden or ongoing stress activates your nervous system and floods your bloodstream with adrenaline and cortisol, two hormones that raise blood pressure, increase heart rate and spike blood sugar. These changes pitch your body into a fight or flight response. That enabled our ancestors to outrun saber-toothed tigers, and it's helpful today for situations like dodging a car accident. But most modern chronic stressors, such as finances or a challenging relationship, keep your body in that heightened state, which hurts your  health. Effects of Too Much Stress If constantly under stress, most of [place] will eventually start to function less well.  Multiple studies link chronic stress to a higher risk of heart disease, stroke, depression, weight gain, memory loss and even premature death, so it's important to recognize the warning signals. Talk to your doctor about ways to manage stress if you're experiencing any of these symptoms: Prolonged periods of poor sleep. Regular, severe headaches. Unexplained weight loss or gain. Feelings of isolation, withdrawal or worthlessness. Constant anger and irritability. Loss of interest in activities. Constant worrying or obsessive thinking. Excessive alcohol or drug use. Inability to concentrate.  10 Ways to Cope with Chronic Stress It's key to recognize stressful situations as they occur because it allows you to focus on managing how you react. We all need to know when to close our eyes and take a deep breath when we feel tension rising. Use these tips to prevent or reduce chronic stress. 1. Rebalance Work and Home All work and no play? If you're spending too much time at the office, intentionally put more dates in your calendar to enjoy time for fun, either alone or with others. 2. Get Regular Exercise Moving your body on a regular basis balances the nervous system and increases blood circulation, helping to flush out stress hormones. Even a daily 20-minute walk makes a difference. Any kind of exercise can lower stress and improve your mood ? just pick activities that you enjoy and make it a regular habit. 3. Eat Well and Limit Alcohol and Stimulants Alcohol, nicotine and caffeine may temporarily relieve stress but have negative health impacts and can make stress worse in the long run. Well-nourished bodies cope better, so start with a good breakfast, add more organic fruits and vegetables for a well-balanced diet, avoid processed foods and sugar, try herbal tea and drink more  water. 4. Connect with Supportive People Talking face to face with another person releases hormones that reduce stress. Lean on those good listeners in your life. 5. [place] Time Do you enjoy gardening, reading, listening to music or some other creative pursuit? Engage in activities that bring you pleasure and joy; research shows that reduces stress by almost half and lowers your heart rate, too. 6. Practice Meditation, Stress Reduction or Yoga Relaxation techniques activate a state of restfulness that counterbalances your body's fight-or-flight hormones. Even if this also means a 10-minute break in a long day: listen to music, read, go for a walk in nature, do a hobby, take a bath or spend time with a friend. Also consider doing a mindfulness exercise or try a daily deep breathing or imagery practice. Deep Breathing Slow, calm and deep breathing can help you relax. Try these steps to focus on your breathing and repeat as needed. Find a  comfortable position and close your eyes. Exhale and drop your shoulders. Breathe in through your nose; fill your lungs and then your belly. Think of relaxing your body, quieting your mind and becoming calm and peaceful. Breathe out slowly through your nose, relaxing your belly. Think of releasing tension, pain, worries or distress. Repeat steps three and four until you feel relaxed. Imagery This involves using your mind to excite the senses -- sound, vision, smell, taste and feeling. This may help ease your stress. Begin by getting comfortable and then do some slow breathing. Imagine a place you love being at. It could be somewhere from your childhood, somewhere you vacationed or just a place in your imagination. Feel how it is to be in the place you're imagining. Pay attention to the sounds, air, colors, and who is there with you. This is a place where you feel cared for and loved. All is well. You are safe. Take in all the smells, sounds, tastes and  feelings. As you do, feel your body being nourished and healed. Feel the calm that surrounds you. Breathe in all the good. Breathe out any discomfort or tension. 7. Sleep Enough If you get less than seven to eight hours of sleep, your body won't tolerate stress as well as it could. If stress keeps you up at night, address the cause, and add extra meditation into your day to make up for the lost z's. Try to get seven to nine hours of sleep each night. Make a regular bedtime schedule. Keep your room dark and cool. Try to avoid computers, TV, cell phones and tablets before bed. 8. Bond with Connections You Enjoy Go out for a coffee with a friend, chat with a neighbor, call a family member, visit with a clergy member, or even hang out with your pet. Clinical studies show that spending even a short time with a companion animal can cut anxiety levels almost in half. 9. Take a Vacation Getting away from it all can reset your stress tolerance by increasing your mental and emotional outlook, which makes you a happier, more productive person upon return. Leave your cellphone and laptop at home! 10. See a Counselor, Coach or Therapist If negative thoughts overwhelm your ability to make positive changes, it's time to seek professional help. Make an appointment today--your health and life are worth it."  10 LITTLE Things To Do When You're Feeling Too Down To Do Anything  Take a shower. Even if you plan to stay in all day long and not see a soul, take a shower. It takes the most effort to hop in to the shower but once you do, you'll feel immediate results. It will wake you up and you'll be feeling much fresher (and cleaner too).  Brush and floss your teeth. Give your teeth a good brushing with a floss finish. It's a small task but it feels so good and you can check 'taking care of your health' off the list of things to do.  Do something small on your list. Most of [place] have some small thing we would like to get  done (load of laundry, sew a button, email a friend). Doing one of these things will make you feel like you've accomplished something.  Drink water. Drinking water is easy right? It's also really beneficial for your health so keep a glass beside you all day and take sips often. It gives you energy and prevents you from boredom eating.  Do some floor exercises. The last  thing you want to do is exercise but it might be just the thing you need the most. Keep it simple and do exercises that involve sitting or laying on the floor. Even the smallest of exercises release chemicals in the brain that make you feel good. Yoga stretches or core exercises are going to make you feel good with minimal effort.  Make your bed. Making your bed takes a few minutes but it's productive and you'll feel relieved when it's done. An unmade bed is a huge visual reminder that you're having an unproductive day. Do it and consider it your housework for the day.  Put on some nice clothes. Take the sweatpants off even if you don't plan to go anywhere. Put on clothes that make you feel good. Take a look in the mirror so your brain recognizes the sweatpants have been replaced with clothes that make you look great. It's an instant confidence booster.  Wash the dishes. A pile of dirty dishes in the sink is a reflection of your mood. It's possible that if you wash up the dishes, your mood will follow suit. It's worth a try.  Cook a real meal. If you have the luxury to have a "do nothing" day, you have time to make a real meal for yourself. Make a meal that you love to eat. The process is good to get you out of the funk and the food will ensure you have more energy for tomorrow.  Write out your thoughts by hand. When you hand write, you stimulate your brain to focus on the moment that you're in so make yourself comfortable and write whatever comes into your mind. Put those thoughts out on paper so they stop spinning around in your  head. Those thoughts might be the very thing holding you down.     10/02/2021   12:58 PM 07/10/2021    3:08 PM 04/09/2021   11:46 AM 02/26/2021    4:35 PM 01/09/2021   10:13 AM  Depression screen PHQ 2/9  Decreased Interest 1 1 1 2 3   Down, Depressed, Hopeless 1 1 1 2 3   PHQ - 2 Score 2 2 2 4 6   Altered sleeping 3 3 3 3 3   Tired, decreased energy 3 3 2 3 3   Change in appetite 3 3 2 2 3   Feeling bad or failure about yourself  0 0 1 2 3   Trouble concentrating 1 2 2 1 3   Moving slowly or fidgety/restless 0 3 2 3 3   Suicidal thoughts 0 0 0 0 3  PHQ-9 Score 12 16 14 18 27   Difficult doing work/chores Somewhat difficult          24- Hour Availability:    [facility]  [address] [place], [place] Crisis [phone]   [facility] [employer] [phone]   [place]  [phone]    [place]  [phone] (after hours)   Therapeutic Alternative/Mobile Crisis   [phone]   [place] National Suicide Hotline  612-553-9826 (TALK) OR 988   Call 911 or go to emergency room   [facility]  [phone]);  [place] and [employer]  [phone]); [place], [place], [place], [place], [place], [place], [place]         Patient Goals: Follow up goal      Follow up:  Patient agrees to Care Plan and Follow-up.  Plan: The Managed Medicaid care  management team will reach out to the patient again over the next 30 days.  Date/time of next scheduled Social Work care management/care coordination outreach:  11/19/21 at 12:30 pm.  Eula Fried, BSW, MSW, [place] Medicaid LCSW [email or web address] Phone: [phone]

## 2021-10-17 NOTE — Patient Instructions (Signed)
Visit Information  Kelli Wells was given information about Medicaid Managed Care team care coordination services as a part of their [place] Medicaid benefit. Kelli Wells verbally consentedto engagement with the [employer] Care team.   If you are experiencing a medical emergency, please call 911 or report to your local emergency department or urgent care.   If you have a non-emergency medical problem during routine business hours, please contact your provider's office and ask to speak with a nurse.   For questions related to your [facility] health plan, please call: [phone]  OR visit the member homepage at: [email or web address]  If you would like to schedule transportation through your [facility] plan, please call the following number at least 2 days in advance of your appointment: [phone]  If you are experiencing a behavioral health crisis, call the [employer] at [phone]). The line is available 24 hours a day, seven days a week.  If you would like help to quit smoking, call 1-800-QUIT-NOW (508)095-7710) OR Espaol: 1-855-Djelo-Ya (1-660-600-4599) o para ms informacin haga clic aqu or Text READY to 200-400 to register via text   Following is a copy of your plan of care:  Care Plan : LCSW Plan of Care  Updates made by Greg Cutter, LCSW since 10/17/2021 12:00 AM     Problem: Anxiety Identification (Anxiety)      Long-Range Goal: Anxiety Symptoms Identified and Need for Mental Health Support Involvement   Start Date: 10/02/2021  Priority: High  Note:   Priority: High  Timeframe:  Long-Range Goal Priority:  High Start Date:   10/02/21          Expected End Date:  ongoing                     Follow Up Date--11/19/21 at 12:30 pm.   - check out counseling - keep 90 percent of counseling  appointments - schedule counseling appointment    Why is this important?             Beating depression may take some time.            If you don't feel better right away, don't give up on your treatment plan.    Current barriers:   Chronic Mental Health needs related to depression, stress and anxiety. Patient requires Support, Education, Resources, Referrals, Advocacy, and Care Coordination, in order to meet Unmet Mental Health Needs. Patient will implement clinical interventions discussed today to decrease symptoms of depression and increase knowledge and/or ability of: coping skills. Mental Health Concerns and Social Isolation Patient lacks knowledge of available community counseling agencies and resources.  Clinical Goal(s): verbalize understanding of plan for management of Anxiety, Depression, and Stress and demonstrate a reduction in symptoms. Patient will connect with a provider for ongoing mental health treatment, increase coping skills, healthy habits, self-management skills, and stress reduction       Patient Goals/Self-Care Activities: Over the next 120 days Attend scheduled medical appointments Utilize healthy coping skills and supportive resources discussed Contact PCP with any questions or concerns Keep 90 percent of counseling appointments Call your insurance provider for more information about your Enhanced Benefits  Check out counseling resources provided  Begin personal counseling with LCSW, to reduce and manage symptoms of Depression and Stress, until well-established with mental health provider Accept all calls from representative with [facility] in an effort to establish ongoing mental health counseling and  supportive services. Incorporate into daily practice - relaxation techniques, deep breathing exercises, and mindfulness meditation strategies. Talk about feelings with friends, family members, spiritual advisor, etc. Contact LCSW directly [phone]), if you have  questions, need assistance, or if additional social work needs are identified between now and our next scheduled telephone outreach call. Call 988 for mental health hotline/crisis line if needed (24/7 available) Try techniques to reduce symptoms of anxiety/negative thinking (deep breathing, distraction, positive self talk, etc)  - develop a personal safety plan - develop a plan to deal with triggers like holidays, anniversaries - exercise at least 2 to 3 times per week - have a plan for how to handle bad days - journal feelings and what helps to feel better or worse - spend time or talk with others at least 2 to 3 times per week - watch for early signs of feeling worse - begin personal counseling - call and visit an old friend - check out volunteer opportunities - join a support group - laugh; watch a funny movie or comedian - learn and use visualization or guided imagery - perform a random act of kindness - practice relaxation or meditation daily - start or continue a personal journal - practice positive thinking and self-talk -continue with compliance of taking medication  -identify current effective and ineffective coping strategies.  -implement positive self-talk in care to increase self-esteem, confidence and feelings of control.  -consider alternative and complementary therapy approaches such as meditation, mindfulness or yoga.  -journaling, prayer, worship services, meditation or pastoral counseling.  -increase participation in pleasurable group activities such as hobbies, singing, sports or volunteering).  -consider the use of meditative movement therapy such as tai chi, yoga or qigong.  -start a regular daily exercise program based on tolerance, ability and patient choice to support positive thinking and activity     The following coping skill education was provided for stress relief and mental health management: "When your car dies or a deadline looms, how do you respond?  Long-term, low-grade or acute stress takes a serious toll on your body and mind, so don't ignore feelings of constant tension. Stress is a natural part of life. However, too much stress can harm our health, especially if it continues every day. This is chronic stress and can put you at risk for heart problems like heart disease and depression. Understand what's happening inside your body and learn simple coping skills to combat the negative impacts of everyday stressors.  Types of Stress There are two types of stress: Emotional - types of emotional stress are relationship problems, pressure at work, financial worries, experiencing discrimination or having a major life change. Physical - Examples of physical stress include being sick having pain, not sleeping well, recovery from an injury or having an alcohol and drug use disorder. Fight or Flight Sudden or ongoing stress activates your nervous system and floods your bloodstream with adrenaline and cortisol, two hormones that raise blood pressure, increase heart rate and spike blood sugar. These changes pitch your body into a fight or flight response. That enabled our ancestors to outrun saber-toothed tigers, and it's helpful today for situations like dodging a car accident. But most modern chronic stressors, such as finances or a challenging relationship, keep your body in that heightened state, which hurts your health. Effects of Too Much Stress If constantly under stress, most of [place] will eventually start to function less well.  Multiple studies link chronic stress to a higher risk of heart disease, stroke,  depression, weight gain, memory loss and even premature death, so it's important to recognize the warning signals. Talk to your doctor about ways to manage stress if you're experiencing any of these symptoms: Prolonged periods of poor sleep. Regular, severe headaches. Unexplained weight loss or gain. Feelings of isolation, withdrawal or  worthlessness. Constant anger and irritability. Loss of interest in activities. Constant worrying or obsessive thinking. Excessive alcohol or drug use. Inability to concentrate.  10 Ways to Cope with Chronic Stress It's key to recognize stressful situations as they occur because it allows you to focus on managing how you react. We all need to know when to close our eyes and take a deep breath when we feel tension rising. Use these tips to prevent or reduce chronic stress. 1. Rebalance Work and Home All work and no play? If you're spending too much time at the office, intentionally put more dates in your calendar to enjoy time for fun, either alone or with others. 2. Get Regular Exercise Moving your body on a regular basis balances the nervous system and increases blood circulation, helping to flush out stress hormones. Even a daily 20-minute walk makes a difference. Any kind of exercise can lower stress and improve your mood ? just pick activities that you enjoy and make it a regular habit. 3. Eat Well and Limit Alcohol and Stimulants Alcohol, nicotine and caffeine may temporarily relieve stress but have negative health impacts and can make stress worse in the long run. Well-nourished bodies cope better, so start with a good breakfast, add more organic fruits and vegetables for a well-balanced diet, avoid processed foods and sugar, try herbal tea and drink more water. 4. Connect with Supportive People Talking face to face with another person releases hormones that reduce stress. Lean on those good listeners in your life. 5. [place] Time Do you enjoy gardening, reading, listening to music or some other creative pursuit? Engage in activities that bring you pleasure and joy; research shows that reduces stress by almost half and lowers your heart rate, too. 6. Practice Meditation, Stress Reduction or Yoga Relaxation techniques activate a state of restfulness that counterbalances your body's  fight-or-flight hormones. Even if this also means a 10-minute break in a long day: listen to music, read, go for a walk in nature, do a hobby, take a bath or spend time with a friend. Also consider doing a mindfulness exercise or try a daily deep breathing or imagery practice. Deep Breathing Slow, calm and deep breathing can help you relax. Try these steps to focus on your breathing and repeat as needed. Find a comfortable position and close your eyes. Exhale and drop your shoulders. Breathe in through your nose; fill your lungs and then your belly. Think of relaxing your body, quieting your mind and becoming calm and peaceful. Breathe out slowly through your nose, relaxing your belly. Think of releasing tension, pain, worries or distress. Repeat steps three and four until you feel relaxed. Imagery This involves using your mind to excite the senses -- sound, vision, smell, taste and feeling. This may help ease your stress. Begin by getting comfortable and then do some slow breathing. Imagine a place you love being at. It could be somewhere from your childhood, somewhere you vacationed or just a place in your imagination. Feel how it is to be in the place you're imagining. Pay attention to the sounds, air, colors, and who is there with you. This is a place where you feel cared for  and loved. All is well. You are safe. Take in all the smells, sounds, tastes and feelings. As you do, feel your body being nourished and healed. Feel the calm that surrounds you. Breathe in all the good. Breathe out any discomfort or tension. 7. Sleep Enough If you get less than seven to eight hours of sleep, your body won't tolerate stress as well as it could. If stress keeps you up at night, address the cause, and add extra meditation into your day to make up for the lost z's. Try to get seven to nine hours of sleep each night. Make a regular bedtime schedule. Keep your room dark and cool. Try to avoid computers, TV, cell  phones and tablets before bed. 8. Bond with Connections You Enjoy Go out for a coffee with a friend, chat with a neighbor, call a family member, visit with a clergy member, or even hang out with your pet. Clinical studies show that spending even a short time with a companion animal can cut anxiety levels almost in half. 9. Take a Vacation Getting away from it all can reset your stress tolerance by increasing your mental and emotional outlook, which makes you a happier, more productive person upon return. Leave your cellphone and laptop at home! 10. See a Counselor, Coach or Therapist If negative thoughts overwhelm your ability to make positive changes, it's time to seek professional help. Make an appointment today--your health and life are worth it."  10 LITTLE Things To Do When You're Feeling Too Down To Do Anything  Take a shower. Even if you plan to stay in all day long and not see a soul, take a shower. It takes the most effort to hop in to the shower but once you do, you'll feel immediate results. It will wake you up and you'll be feeling much fresher (and cleaner too).  Brush and floss your teeth. Give your teeth a good brushing with a floss finish. It's a small task but it feels so good and you can check 'taking care of your health' off the list of things to do.  Do something small on your list. Most of [place] have some small thing we would like to get done (load of laundry, sew a button, email a friend). Doing one of these things will make you feel like you've accomplished something.  Drink water. Drinking water is easy right? It's also really beneficial for your health so keep a glass beside you all day and take sips often. It gives you energy and prevents you from boredom eating.  Do some floor exercises. The last thing you want to do is exercise but it might be just the thing you need the most. Keep it simple and do exercises that involve sitting or laying on the floor. Even the  smallest of exercises release chemicals in the brain that make you feel good. Yoga stretches or core exercises are going to make you feel good with minimal effort.  Make your bed. Making your bed takes a few minutes but it's productive and you'll feel relieved when it's done. An unmade bed is a huge visual reminder that you're having an unproductive day. Do it and consider it your housework for the day.  Put on some nice clothes. Take the sweatpants off even if you don't plan to go anywhere. Put on clothes that make you feel good. Take a look in the mirror so your brain recognizes the sweatpants have been replaced with clothes that make  you look great. It's an instant confidence booster.  Wash the dishes. A pile of dirty dishes in the sink is a reflection of your mood. It's possible that if you wash up the dishes, your mood will follow suit. It's worth a try.  Cook a real meal. If you have the luxury to have a "do nothing" day, you have time to make a real meal for yourself. Make a meal that you love to eat. The process is good to get you out of the funk and the food will ensure you have more energy for tomorrow.  Write out your thoughts by hand. When you hand write, you stimulate your brain to focus on the moment that you're in so make yourself comfortable and write whatever comes into your mind. Put those thoughts out on paper so they stop spinning around in your head. Those thoughts might be the very thing holding you down.     24- Hour Availability:    [facility]  [address] [place], [place] [facility] [phone]   [facility] [employer] [place]  [phone]    [place]  [phone] (after hours)   Therapeutic Alternative/Mobile Crisis   [phone]   [place] National Suicide Hotline  780-814-2101 Diamantina Monks) [place] 988   Call 911 or go to emergency room    [facility]  [phone]);  [place] and [employer]  [phone]); [place], [place], [place], [place], [place], [place], [place]         Patient Goals: Follow up goal  Eula Fried, BSW, MSW, [employer] Managed Medicaid LCSW [email or web address] Phone: [phone]

## 2021-10-23 ENCOUNTER — Other Ambulatory Visit: Payer: Self-pay | Admitting: Physician Assistant

## 2021-10-23 ENCOUNTER — Other Ambulatory Visit: Payer: Self-pay | Admitting: Family Medicine

## 2021-10-23 DIAGNOSIS — F419 Anxiety disorder, unspecified: Secondary | ICD-10-CM

## 2021-10-23 DIAGNOSIS — I10 Essential (primary) hypertension: Secondary | ICD-10-CM

## 2021-10-23 NOTE — Telephone Encounter (Signed)
No encounters with Amelia Wilson, MD. 

## 2021-10-23 NOTE — Telephone Encounter (Signed)
Requested Prescriptions  Pending Prescriptions Disp Refills  . amLODipine (NORVASC) 10 MG tablet [Pharmacy Med Name: amLODIPine Besylate 10 MG Oral Tablet] 90 tablet 1    Sig: Take 1 tablet by mouth once daily     Cardiovascular: Calcium Channel Blockers 2 Failed - 10/23/2021 10:38 AM      Failed - Last BP in normal range    BP Readings from Last 1 Encounters:  07/12/21 (!) 159/98         Passed - Last Heart Rate in normal range    Pulse Readings from Last 1 Encounters:  07/12/21 89         Passed - Valid encounter within last 6 months    Recent Outpatient Visits          1 week ago Anxiety and depression   [facility] [place], Odette Horns, MD   3 months ago Abscess of right breast   [facility] [place], Odette Horns, MD   3 months ago Hidradenitis   [facility] [place], Odette Horns, MD   5 months ago Screening for metabolic disorder   [facility] Hoy Register, MD   1 year ago Poor concentration   [facility] Hoy Register, MD      Future Appointments            In 2 weeks Lois Huxley, Cornelius Moras, RPH-CPP [place] [facility]   In 2 months Hoy Register, MD [facility]

## 2021-10-26 ENCOUNTER — Ambulatory Visit: Payer: Self-pay

## 2021-10-26 ENCOUNTER — Other Ambulatory Visit: Payer: Self-pay | Admitting: Pharmacist

## 2021-10-26 DIAGNOSIS — F411 Generalized anxiety disorder: Secondary | ICD-10-CM | POA: Diagnosis not present

## 2021-10-26 DIAGNOSIS — F419 Anxiety disorder, unspecified: Secondary | ICD-10-CM

## 2021-10-26 MED ORDER — HYDROXYZINE HCL 10 MG PO TABS: 10.0000 mg | ORAL_TABLET | Freq: Three times a day (TID) | ORAL | 2 refills | Status: DC | PRN

## 2021-10-29 ENCOUNTER — Other Ambulatory Visit: Payer: Self-pay | Admitting: Pharmacist

## 2021-10-29 VITALS — BP 138/77

## 2021-10-29 DIAGNOSIS — I10 Essential (primary) hypertension: Secondary | ICD-10-CM

## 2021-10-29 NOTE — Chronic Care Management (AMB) (Signed)
Chief Complaint  Patient presents with   Hypertension    Kelli Wells is a 36 y.o. year old female who presented for a telephone visit.   They were referred to the pharmacist by a quality report for assistance in managing hypertension.   Patient is participating in a Managed Medicaid Plan:  Yes  Subjective:  Care Team: Primary Care Provider: Hoy Register, MD ; Next Scheduled Visit: 01/08/22; embedded pharmacist on 11/09/21  Medication Access/Adherence  Current Pharmacy:  [facility] - [address] ([address]), [place] - [address] [place] ([place]) [place] [address] Phone: [phone] Fax: [phone]  [facility] - [place], [place] - [address] [place] [address] Phone: [phone] Fax: [phone]   Patient reports affordability concerns with their medications: No  Patient reports access/transportation concerns to their pharmacy: No  Patient reports adherence concerns with their medications:  No     Hypertension:  Current medications: amlodipine 10 mg daily, carvedilol 25 mg twice daily  Patient has a validated, automated, upper arm home BP cuff Current blood pressure readings readings: ~130s/80s  Patient denies hypotensive s/sx including dizziness, lightheadedness.  Patient denies hypertensive symptoms including headache, chest pain, shortness of breath   Depression/Anxiety :  Current medications: bupropion XL 150 mg twice daily per psychiatrist, sertraline 100 mg daily, trazodone 50 mg QPM, hydroxyzine 10 mg as needed  Reports she is established with a therapist.   Patient inquires about taking bupropion BID, as she reports that bupropion can give her energy. She asks if she can take both tablets together in the morning   Health Maintenance  Health Maintenance Due  Topic Date Due   COVID-19 Vaccine (1) Never done   Hepatitis C Screening  Never done      Objective: Lab Results  Component Value Date   HGBA1C 5.8 (H) 05/23/2021    Lab Results  Component Value Date   CREATININE 0.76 05/23/2021   BUN 9 05/23/2021   NA 140 05/23/2021   K 4.1 05/23/2021   CL 104 05/23/2021   CO2 20 05/23/2021    Lab Results  Component Value Date   CHOL 183 05/23/2021   HDL 41 05/23/2021   LDLCALC 127 (H) 05/23/2021   TRIG 78 05/23/2021   CHOLHDL 3.1 09/26/2017    Medications Reviewed Today     Reviewed by Alden Hipp, RPH-CPP (Pharmacist) on 10/29/21 at 1339  Med List Status: <None>   Medication Order Taking? Sig Documenting Provider Last Dose Status Informant  albuterol (VENTOLIN HFA) 108 (90 Base) MCG/ACT inhaler [id number]  Inhale 1-2 puffs into the lungs every 6 (six) hours as needed for wheezing or shortness of breath.  Patient not taking: Reported on 09/07/2021   Hoy Register, MD  Active   amLODipine (NORVASC) 10 MG tablet [id number] Yes Take 1 tablet by mouth once daily Hoy Register, MD Taking Active   Blood Pressure Monitoring (BLOOD PRESSURE CUFF) MISC [id number]  Use to check blood pressure once daily. Hoy Register, MD  Active   buPROPion (WELLBUTRIN XL) 150 MG 24 hr tablet [id number] Yes Take 1 tablet (150 mg total) by mouth every morning. Hoy Register, MD Taking Active   carvedilol (COREG) 25 MG tablet [id number] Yes Take 1 tablet (25 mg total) by mouth 2 (two) times daily with a meal. Hoy Register, MD Taking Active   clindamycin (CLEOCIN T) 1 % external solution [id number]  Apply topically 2 (two) times daily. For hidradenitis Newlin,  Odette Horns, MD  Active   EPINEPHrine (EPIPEN 2-PAK) 0.3 mg/0.3 mL IJ SOAJ injection [id number]  Inject 0.3 mLs (0.3 mg total) into the muscle as needed for anaphylaxis. Zadie Rhine, MD  Active   fluticasone [facility]) 50 MCG/ACT nasal spray [id number]  Place 2 sprays into both nostrils daily. Hoy Register, MD  Active   hydrOXYzine (ATARAX) 10 MG tablet [id number] Yes Take 1 tablet (10  mg total) by mouth 3 (three) times daily as needed. Hoy Register, MD Taking Active   ipratropium-albuterol (DUONEB) 0.5-2.5 (3) MG/3ML SOLN [id number]  Take 3 mLs by nebulization every 6 (six) hours as needed.  Patient not taking: Reported on 09/07/2021   Lew Dawes, PA-C  Active   ipratropium-albuterol (DUONEB) 0.5-2.5 (3) MG/3ML SOLN [id number]  USE 1 AMPULE IN NEBULIZER VIA NEBULIZATION EVERY 6 HOURS AS NEEDED Wieters, Hallie C, PA-C  Expired 03/06/21 2359   loratadine (CLARITIN) 10 MG tablet [id number]  Take 1 tablet (10 mg total) by mouth daily. Hoy Register, MD  Active   methocarbamol (ROBAXIN) 500 MG tablet [id number]  Take 1 tablet (500 mg total) by mouth every 8 (eight) hours as needed for muscle spasms. Hoy Register, MD  Active   mupirocin ointment (BACTROBAN) 2 % [id number]  Apply 1 application topically daily.  Patient not taking: Reported on 09/07/2021   Raspet, Erin K, PA-C  Active   naproxen (NAPROSYN) 500 MG tablet [id number]  Take 1 tablet (500 mg total) by mouth 2 (two) times daily with a meal.  Patient not taking: Reported on 09/07/2021   Hoy Register, MD  Active   ondansetron (ZOFRAN-ODT) 4 MG disintegrating tablet [id number]  Take 1 tablet (4 mg total) by mouth every 8 (eight) hours as needed for nausea or vomiting.  Patient not taking: Reported on 09/07/2021   Waldon Merl, PA-C  Active   sertraline (ZOLOFT) 100 MG tablet [id number] Yes Take 1 tablet by mouth once daily Hoy Register, MD Taking Active   traZODone (DESYREL) 50 MG tablet [id number] Yes Take 1 tablet (50 mg total) by mouth at bedtime as needed for sleep. Hoy Register, MD Taking Active   Vitamin D, Ergocalciferol, (DRISDOL) 1.25 MG (50000 UNIT) CAPS capsule [id number]  Take 1 capsule (50,000 Units total) by mouth every 7 (seven) days.  Patient not taking: Reported on 09/07/2021   Mayers, Cari S, PA-C  Active               Assessment/Plan:   Hypertension: - Currently controlled -  Reviewed appropriate blood pressure monitoring technique and reviewed goal blood pressure. Recommended to check home blood pressure and heart rate periodically - Recommend to follow up with embedded pharmacist as scheduled   Depression/Anxiety: - Currently uncontrolled but improving  - Discussed that we typically advise to take bupropion in the morning. She will talk to her psychiatrist - Recommend to continue current regimen along with psychiatry follow up    Follow Up Plan: follow up with embedded pharmacist as scheduled  Catie Eppie Gibson, PharmD, [facility] [phone]

## 2021-10-29 NOTE — Patient Instructions (Signed)
Ms. Brisby,   Keep up the great work!  Check your blood pressure once daily, and any time you have concerning symptoms like headache, chest pain, dizziness, shortness of breath, or vision changes.   Our goal is less than 130/80.  To appropriately check your blood pressure, make sure you do the following:  1) Avoid caffeine, exercise, or tobacco products for 30 minutes before checking. Empty your bladder. 2) Sit with your back supported in a flat-backed chair. Rest your arm on something flat (arm of the chair, table, etc). 3) Sit still with your feet flat on the floor, resting, for at least 5 minutes.  4) Check your blood pressure. Take 1-2 readings.  5) Write down these readings and bring with you to any provider appointments.  Bring your home blood pressure machine with you to a provider's office for accuracy comparison at least once a year.   Make sure you take your blood pressure medications before you come to any office visit, even if you were asked to fast for labs.   Take care!  Catie Eppie Gibson, PharmD, [facility] [phone]

## 2021-10-30 DIAGNOSIS — H1045 Other chronic allergic conjunctivitis: Secondary | ICD-10-CM | POA: Diagnosis not present

## 2021-10-30 DIAGNOSIS — J301 Allergic rhinitis due to pollen: Secondary | ICD-10-CM | POA: Diagnosis not present

## 2021-10-30 DIAGNOSIS — J3089 Other allergic rhinitis: Secondary | ICD-10-CM | POA: Diagnosis not present

## 2021-10-30 DIAGNOSIS — T781XXD Other adverse food reactions, not elsewhere classified, subsequent encounter: Secondary | ICD-10-CM | POA: Diagnosis not present

## 2021-11-02 ENCOUNTER — Encounter: Payer: Self-pay | Admitting: Family Medicine

## 2021-11-02 ENCOUNTER — Other Ambulatory Visit: Payer: Self-pay | Admitting: Family Medicine

## 2021-11-02 MED ORDER — METRONIDAZOLE 0.75 % VA GEL
1.0000 | Freq: Every day | VAGINAL | 0 refills | Status: DC
Start: 2021-11-02 — End: 2022-01-02

## 2021-11-09 ENCOUNTER — Other Ambulatory Visit: Payer: Self-pay

## 2021-11-09 ENCOUNTER — Encounter: Payer: Self-pay | Admitting: Pharmacist

## 2021-11-09 ENCOUNTER — Ambulatory Visit: Payer: Medicaid Other | Attending: Family Medicine | Admitting: Pharmacist

## 2021-11-09 VITALS — BP 124/84 | HR 75

## 2021-11-09 DIAGNOSIS — I10 Essential (primary) hypertension: Secondary | ICD-10-CM | POA: Diagnosis not present

## 2021-11-09 DIAGNOSIS — F419 Anxiety disorder, unspecified: Secondary | ICD-10-CM

## 2021-11-09 DIAGNOSIS — E559 Vitamin D deficiency, unspecified: Secondary | ICD-10-CM | POA: Diagnosis not present

## 2021-11-09 DIAGNOSIS — F32A Depression, unspecified: Secondary | ICD-10-CM | POA: Diagnosis not present

## 2021-11-09 MED ORDER — VITAMIN D (ERGOCALCIFEROL) 1.25 MG (50000 UNIT) PO CAPS
50000.0000 [IU] | ORAL_CAPSULE | ORAL | 0 refills | Status: DC
  Filled 2021-11-09: qty 12, 84d supply, fill #0

## 2021-11-09 NOTE — Progress Notes (Signed)
S:     No chief complaint on file.  Kelli Wells is a 36 y.o. female who presents for hypertension evaluation, education, and management. PMH is significant for HTN, asthma, anxiety, depression. Patient was referred and last seen by Primary Care Provider, Dr. Alvis Lemmings, on 10/15/2021. She was identified as failing our True [place] metric for above-goal BP by one our CPPs. She was referred to clinic today for a BP check. Of note her last clinic BP with [place] was 159/98 mmHg. She reported a home BP of 138/77 mmHg on 10/29/2021 to one of our CPPs working on our [employer].    Today, patient arrives in good spirits and presents without assistance. Denies dizziness, headache, blurred vision, swelling.   Patient reports hypertension is longstanding.   Family/Social history:  -Fhx: HTN, MI, CKD, heart disease -Tobacco: current 0.25 PPD smoker  -Alcohol: none reported   Medication adherence reported. Patient has taken BP medications today.   Current antihypertensives include: amlodipine 10 mg daily, carvedilol 25 mg BID  Antihypertensives tried in the past include: labetalol, nifedipine  Reported home BP readings:  -1 reading last week in the 130s/70s  Patient reported dietary habits:  -Compliant with salt restriction. Does not add salt to foods.  -Drinks Red Bull energy drinks   Patient-reported exercise habits:  -Active at work but no formal exercise regimen outside of work  O:  Vitals:   11/09/21 1634  BP: 124/84  Pulse: 75    Last 3 Office BP readings: BP Readings from Last 3 Encounters:  11/09/21 124/84  10/29/21 138/77  07/12/21 (!) 159/98    BMET    Component Value Date/Time   NA 140 05/23/2021 1033   NA 135 (L) 03/22/2013 0320   K 4.1 05/23/2021 1033   K 3.9 03/22/2013 0320   CL 104 05/23/2021 1033   CL 105 03/22/2013 0320   CO2 20 05/23/2021 1033   CO2 24 03/22/2013 0320   GLUCOSE 88 05/23/2021 1033   GLUCOSE 76 09/01/2017 1722   GLUCOSE 99  03/22/2013 0320   BUN 9 05/23/2021 1033   BUN 11 03/22/2013 0320   CREATININE 0.76 05/23/2021 1033   CREATININE 0.86 01/01/2016 1020   CALCIUM 9.3 05/23/2021 1033   CALCIUM 9.7 03/22/2013 0320   GFRNONAA 115 06/21/2019 1659   GFRNONAA >89 01/01/2016 1020   GFRAA 133 06/21/2019 1659   GFRAA >89 01/01/2016 1020    Renal function: CrCl cannot be calculated (Patient's most recent lab result is older than the maximum 21 days allowed.).  Clinical ASCVD: No  The ASCVD Risk score (Arnett DK, et al., 2019) failed to calculate for the following reasons:   The 2019 ASCVD risk score is only valid for ages 38 to 80  Patient is participating in a Managed Medicaid Plan:  Yes    A/P: Hypertension longstanding currently at goal on current medications. BP goal < 130/80 mmHg. Medication adherence appears optimal. Of note, pt requests refill of her vitamin D rx. Refill sent and will get vit D [place] level today.  -Continued current regimen. -Refill sent for Ergocalciferol 50,000u once weekly.  -F/u labs ordered - Vit D(OH) -Counseled on lifestyle modifications for blood pressure control including reduced dietary sodium, increased exercise, adequate sleep. -Encouraged patient to check BP at home and bring log of readings to next visit. Counseled on proper use of home BP cuff.    Results reviewed and written information provided.    Written patient instructions provided. Patient verbalized  understanding of treatment plan.  Total time in face to face counseling 25 minutes.    Follow-up: PCP clinic visit in Aug with PCP.    Butch Penny, PharmD, [place] [place], CPP Clinical Pharmacist [facility] [phone]

## 2021-11-10 LAB — VITAMIN D 25 HYDROXY (VIT D DEFICIENCY, FRACTURES): Vit D, 25-Hydroxy: 18.6 ng/mL — ABNORMAL LOW (ref 30.0–100.0)

## 2021-11-12 ENCOUNTER — Other Ambulatory Visit: Payer: Self-pay | Admitting: Family Medicine

## 2021-11-12 DIAGNOSIS — T781XXA Other adverse food reactions, not elsewhere classified, initial encounter: Secondary | ICD-10-CM | POA: Diagnosis not present

## 2021-11-12 DIAGNOSIS — E559 Vitamin D deficiency, unspecified: Secondary | ICD-10-CM

## 2021-11-12 MED ORDER — VITAMIN D (ERGOCALCIFEROL) 1.25 MG (50000 UNIT) PO CAPS: 50000.0000 [IU] | ORAL_CAPSULE | ORAL | 1 refills | Status: DC

## 2021-11-19 ENCOUNTER — Other Ambulatory Visit: Payer: Self-pay | Admitting: Licensed Clinical Social Worker

## 2021-11-19 NOTE — Patient Instructions (Signed)
Visit Information  Kelli Wells was given information about Medicaid Managed Care team care coordination services as a part of their [place] [employer] benefit. Kelli Wells verbally consentedto engagement with the [employer] Care team.   If you are experiencing a medical emergency, please call 911 or report to your local emergency department or urgent care.   If you have a non-emergency medical problem during routine business hours, please contact your provider's office and ask to speak with a nurse.   For questions related to your [facility] health plan, please call: [phone]  OR visit the member homepage at: [email or web address]  If you would like to schedule transportation through your [facility] plan, please call the following number at least 2 days in advance of your appointment: [phone]  If you are experiencing a behavioral health crisis, call the [place] at [phone]). The line is available 24 hours a day, seven days a week.  If you would like help to quit smoking, call 1-800-QUIT-NOW (1-(224)888-4176) OR Espaol: 1-855-Djelo-Ya  Following is a copy of your plan of care:  Care Plan : LCSW Plan of Care  Updates made by Kelli Cutter, LCSW since 11/19/2021 12:00 AM     Problem: Anxiety Identification (Anxiety)      Long-Range Goal: Anxiety Symptoms Identified and Need for Mental Health Support Involvement   Start Date: 10/02/2021  Priority: High  Note:   Priority: High  Timeframe:  Long-Range Goal Priority:  High Start Date:   10/02/21          Expected End Date:  11/19/21          Follow Up Date--Goal closed. Patient has successfully been connected to both psychiatry and counseling at [facility] for [place].   - check out counseling - keep 90 percent of counseling appointments -  schedule counseling appointment    Why is this important?             Beating depression may take some time.            If you don't feel better right away, don't give up on your treatment plan.    Current barriers:   Chronic Mental Health needs related to depression, stress and anxiety. Patient requires Support, Education, Resources, Referrals, Advocacy, and Care Coordination, in order to meet Unmet Mental Health Needs. Patient will implement clinical interventions discussed today to decrease symptoms of depression and increase knowledge and/or ability of: coping skills. Mental Health Concerns and Social Isolation Patient lacks knowledge of available community counseling agencies and resources.  Clinical Goal(s): verbalize understanding of plan for management of Anxiety, Depression, and Stress and demonstrate a reduction in symptoms. Patient will connect with a provider for ongoing mental health treatment, increase coping skills, healthy habits, self-management skills, and stress reduction       Patient Goals/Self-Care Activities: Over the next 120 days Attend scheduled medical appointments Utilize healthy coping skills and supportive resources discussed Contact PCP with any questions or concerns Keep 90 percent of counseling appointments Call your insurance provider for more information about your Enhanced Benefits  Check out counseling resources provided  Begin personal counseling with LCSW, to reduce and manage symptoms of Depression and Stress, until well-established with mental health provider Accept all calls from representative with [facility] in an effort to establish ongoing mental health counseling and supportive services. Incorporate into daily practice - relaxation techniques, deep breathing exercises, and mindfulness meditation strategies.  Talk about feelings with friends, family members, spiritual advisor, etc. Contact LCSW directly [phone]), if you have questions, need  assistance, or if additional social work needs are identified between now and our next scheduled telephone outreach call. Call 988 for mental health hotline/crisis line if needed (24/7 available) Try techniques to reduce symptoms of anxiety/negative thinking (deep breathing, distraction, positive self talk, etc)  - develop a personal safety plan - develop a plan to deal with triggers like holidays, anniversaries - exercise at least 2 to 3 times per week - have a plan for how to handle bad days - journal feelings and what helps to feel better or worse - spend time or talk with others at least 2 to 3 times per week - watch for early signs of feeling worse - begin personal counseling - call and visit an old friend - check out volunteer opportunities - join a support group - laugh; watch a funny movie or comedian - learn and use visualization or guided imagery - perform a random act of kindness - practice relaxation or meditation daily - start or continue a personal journal - practice positive thinking and self-talk -continue with compliance of taking medication  -identify current effective and ineffective coping strategies.  -implement positive self-talk in care to increase self-esteem, confidence and feelings of control.  -consider alternative and complementary therapy approaches such as meditation, mindfulness or yoga.  -journaling, prayer, worship services, meditation or pastoral counseling.  -increase participation in pleasurable group activities such as hobbies, singing, sports or volunteering).  -consider the use of meditative movement therapy such as tai chi, yoga or qigong.  -start a regular daily exercise program based on tolerance, ability and patient choice to support positive thinking and activity     The following coping skill education was provided for stress relief and mental health management: "When your car dies or a deadline looms, how do you respond? Long-term, low-grade  or acute stress takes a serious toll on your body and mind, so don't ignore feelings of constant tension. Stress is a natural part of life. However, too much stress can harm our health, especially if it continues every day. This is chronic stress and can put you at risk for heart problems like heart disease and depression. Understand what's happening inside your body and learn simple coping skills to combat the negative impacts of everyday stressors.  Types of Stress There are two types of stress: Emotional - types of emotional stress are relationship problems, pressure at work, financial worries, experiencing discrimination or having a major life change. Physical - Examples of physical stress include being sick having pain, not sleeping well, recovery from an injury or having an alcohol and drug use disorder. Fight or Flight Sudden or ongoing stress activates your nervous system and floods your bloodstream with adrenaline and cortisol, two hormones that raise blood pressure, increase heart rate and spike blood sugar. These changes pitch your body into a fight or flight response. That enabled our ancestors to outrun saber-toothed tigers, and it's helpful today for situations like dodging a car accident. But most modern chronic stressors, such as finances or a challenging relationship, keep your body in that heightened state, which hurts your health. Effects of Too Much Stress If constantly under stress, most of [place] will eventually start to function less well.  Multiple studies link chronic stress to a higher risk of heart disease, stroke, depression, weight gain, memory loss and even premature death, so it's important to recognize the warning  signals. Talk to your doctor about ways to manage stress if you're experiencing any of these symptoms: Prolonged periods of poor sleep. Regular, severe headaches. Unexplained weight loss or gain. Feelings of isolation, withdrawal or worthlessness. Constant anger  and irritability. Loss of interest in activities. Constant worrying or obsessive thinking. Excessive alcohol or drug use. Inability to concentrate.  10 Ways to Cope with Chronic Stress It's key to recognize stressful situations as they occur because it allows you to focus on managing how you react. We all need to know when to close our eyes and take a deep breath when we feel tension rising. Use these tips to prevent or reduce chronic stress. 1. Rebalance Work and Home All work and no play? If you're spending too much time at the office, intentionally put more dates in your calendar to enjoy time for fun, either alone or with others. 2. Get Regular Exercise Moving your body on a regular basis balances the nervous system and increases blood circulation, helping to flush out stress hormones. Even a daily 20-minute walk makes a difference. Any kind of exercise can lower stress and improve your mood ? just pick activities that you enjoy and make it a regular habit. 3. Eat Well and Limit Alcohol and Stimulants Alcohol, nicotine and caffeine may temporarily relieve stress but have negative health impacts and can make stress worse in the long run. Well-nourished bodies cope better, so start with a good breakfast, add more organic fruits and vegetables for a well-balanced diet, avoid processed foods and sugar, try herbal tea and drink more water. 4. Connect with Supportive People Talking face to face with another person releases hormones that reduce stress. Lean on those good listeners in your life. 5. [place] Time Do you enjoy gardening, reading, listening to music or some other creative pursuit? Engage in activities that bring you pleasure and joy; research shows that reduces stress by almost half and lowers your heart rate, too. 6. Practice Meditation, Stress Reduction or Yoga Relaxation techniques activate a state of restfulness that counterbalances your body's fight-or-flight hormones. Even  if this also means a 10-minute break in a long day: listen to music, read, go for a walk in nature, do a hobby, take a bath or spend time with a friend. Also consider doing a mindfulness exercise or try a daily deep breathing or imagery practice. Deep Breathing Slow, calm and deep breathing can help you relax. Try these steps to focus on your breathing and repeat as needed. Find a comfortable position and close your eyes. Exhale and drop your shoulders. Breathe in through your nose; fill your lungs and then your belly. Think of relaxing your body, quieting your mind and becoming calm and peaceful. Breathe out slowly through your nose, relaxing your belly. Think of releasing tension, pain, worries or distress. Repeat steps three and four until you feel relaxed. Imagery This involves using your mind to excite the senses -- sound, vision, smell, taste and feeling. This may help ease your stress. Begin by getting comfortable and then do some slow breathing. Imagine a place you love being at. It could be somewhere from your childhood, somewhere you vacationed or just a place in your imagination. Feel how it is to be in the place you're imagining. Pay attention to the sounds, air, colors, and who is there with you. This is a place where you feel cared for and loved. All is well. You are safe. Take in all the smells, sounds, tastes and  feelings. As you do, feel your body being nourished and healed. Feel the calm that surrounds you. Breathe in all the good. Breathe out any discomfort or tension. 7. Sleep Enough If you get less than seven to eight hours of sleep, your body won't tolerate stress as well as it could. If stress keeps you up at night, address the cause, and add extra meditation into your day to make up for the lost z's. Try to get seven to nine hours of sleep each night. Make a regular bedtime schedule. Keep your room dark and cool. Try to avoid computers, TV, cell phones and tablets before  bed. 8. Bond with Connections You Enjoy Go out for a coffee with a friend, chat with a neighbor, call a family member, visit with a clergy member, or even hang out with your pet. Clinical studies show that spending even a short time with a companion animal can cut anxiety levels almost in half. 9. Take a Vacation Getting away from it all can reset your stress tolerance by increasing your mental and emotional outlook, which makes you a happier, more productive person upon return. Leave your cellphone and laptop at home! 10. See a Counselor, Coach or Therapist If negative thoughts overwhelm your ability to make positive changes, it's time to seek professional help. Make an appointment today--your health and life are worth it."  10 LITTLE Things To Do When You're Feeling Too Down To Do Anything  Take a shower. Even if you plan to stay in all day long and not see a soul, take a shower. It takes the most effort to hop in to the shower but once you do, you'll feel immediate results. It will wake you up and you'll be feeling much fresher (and cleaner too).  Brush and floss your teeth. Give your teeth a good brushing with a floss finish. It's a small task but it feels so good and you can check 'taking care of your health' off the list of things to do.  Do something small on your list. Most of [place] have some small thing we would like to get done (load of laundry, sew a button, email a friend). Doing one of these things will make you feel like you've accomplished something.  Drink water. Drinking water is easy right? It's also really beneficial for your health so keep a glass beside you all day and take sips often. It gives you energy and prevents you from boredom eating.  Do some floor exercises. The last thing you want to do is exercise but it might be just the thing you need the most. Keep it simple and do exercises that involve sitting or laying on the floor. Even the smallest of exercises release  chemicals in the brain that make you feel good. Yoga stretches or core exercises are going to make you feel good with minimal effort.  Make your bed. Making your bed takes a few minutes but it's productive and you'll feel relieved when it's done. An unmade bed is a huge visual reminder that you're having an unproductive day. Do it and consider it your housework for the day.  Put on some nice clothes. Take the sweatpants off even if you don't plan to go anywhere. Put on clothes that make you feel good. Take a look in the mirror so your brain recognizes the sweatpants have been replaced with clothes that make you look great. It's an instant confidence booster.  Wash the dishes. A pile of dirty  dishes in the sink is a reflection of your mood. It's possible that if you wash up the dishes, your mood will follow suit. It's worth a try.  Cook a real meal. If you have the luxury to have a "do nothing" day, you have time to make a real meal for yourself. Make a meal that you love to eat. The process is good to get you out of the funk and the food will ensure you have more energy for tomorrow.  Write out your thoughts by hand. When you hand write, you stimulate your brain to focus on the moment that you're in so make yourself comfortable and write whatever comes into your mind. Put those thoughts out on paper so they stop spinning around in your head. Those thoughts might be the very thing holding you down.     10/02/2021   12:58 PM 07/10/2021    3:08 PM 04/09/2021   11:46 AM 02/26/2021    4:35 PM 01/09/2021   10:13 AM  Depression screen PHQ 2/9  Decreased Interest _0 Down, Depressed, Hopeless _1 PHQ - 2 Score _2 Altered sleeping _3 Tired, decreased energy _4 Change in appetite _5 Feeling bad or failure about yourself  0 0 _6 Trouble concentrating _7 Moving slowly or fidgety/restless 0 _8 Suicidal thoughts 0 0 0 0 3  PHQ-9 Score  _9 Difficult doing work/chores Somewhat difficult          24- Hour Availability:    [facility]  [address] [place], [place] [facility] [phone]   [facility] [employer] [phone]   [place]  [phone]    [place]  [phone] (after hours)   Therapeutic Alternative/Mobile Crisis   [phone]   [place] National Suicide Hotline  (361) 257-9091 (TALK) OR 988   Call 911 or go to emergency room   [facility]  [phone]);  [place] and [employer]  [phone]); [place], [place], [place], [place], [place], [place], [place]         Patient Goals: Follow up goal  Kelli Wells, BSW, MSW, [employer] Managed Medicaid LCSW [email or web address] Phone: [phone]

## 2021-11-19 NOTE — Patient Outreach (Addendum)
Medicaid Managed Care Social Work Note  11/19/2021 Name:  Kelli Wells MRN:  [id number] DOB:  1986/03/18  Kelli Wells is an 36 y.o. year old female who is a primary patient of Kelli Rakes, MD.  The Medicaid Managed Care Coordination team was consulted for assistance with:  [place] and Resources  [name] was given information about Medicaid Managed Care Coordination team services today. [name] Patient agreed to services and verbal consent obtained.  Engaged with patient  for by telephone forfollow up visit in response to referral for case management and/or care coordination services.   Assessments/Interventions:  Review of past medical history, allergies, medications, health status, including review of consultants reports, laboratory and other test data, was performed as part of comprehensive evaluation and provision of chronic care management services.  SDOH: (Social Determinant of Health) assessments and interventions performed: SDOH Interventions    Flowsheet Row Most Recent Value  SDOH Interventions   Stress Interventions Offered [employer], Provide Counseling       Advanced Directives Status:  See Care Plan for related entries.  Care Plan                 Allergies  Allergen Reactions   Sulfa Antibiotics Anaphylaxis and Swelling   Shellfish Allergy Swelling    Facial swelling     Medications Reviewed Today     Reviewed by Greg Cutter, LCSW (Social Worker) on 11/19/21 at 95  Med List Status: <None>   Medication Order Taking? Sig Documenting Provider Last Dose Status Informant  albuterol (VENTOLIN HFA) 108 (90 Base) MCG/ACT inhaler [id number] No Inhale 1-2 puffs into the lungs every 6 (six) hours as needed for wheezing or shortness of breath.  Patient not taking: Reported on 09/07/2021   Kelli Rakes, MD Not Taking Active   amLODipine (NORVASC) 10 MG tablet [id number] No Take 1 tablet by  mouth once daily Kelli Rakes, MD Taking Active   Blood Pressure Monitoring (BLOOD PRESSURE CUFF) MISC [id number]  Use to check blood pressure once daily. Kelli Rakes, MD  Active   buPROPion (WELLBUTRIN XL) 150 MG 24 hr tablet [id number] No Take 1 tablet (150 mg total) by mouth every morning. Kelli Rakes, MD Taking Active   carvedilol (COREG) 25 MG tablet [id number] No Take 1 tablet (25 mg total) by mouth 2 (two) times daily with a meal. Kelli Rakes, MD Taking Active   clindamycin (CLEOCIN T) 1 % external solution [id number] No Apply topically 2 (two) times daily. For hidradenitis Kelli Rakes, MD Taking Active   EPINEPHrine (EPIPEN 2-PAK) 0.3 mg/0.3 mL IJ SOAJ injection [id number] No Inject 0.3 mLs (0.3 mg total) into the muscle as needed for anaphylaxis. Ripley Fraise, MD Taking Active   fluticasone [facility]) 50 MCG/ACT nasal spray [id number] No Place 2 sprays into both nostrils daily. Kelli Rakes, MD Taking Active   hydrOXYzine (ATARAX) 10 MG tablet [id number] No Take 1 tablet (10 mg total) by mouth 3 (three) times daily as needed. Kelli Rakes, MD Taking Active   ipratropium-albuterol (DUONEB) 0.5-2.5 (3) MG/3ML SOLN [id number] No Take 3 mLs by nebulization every 6 (six) hours as needed.  Patient not taking: Reported on 09/07/2021   Janith Lima, PA-C Not Taking Active   ipratropium-albuterol (DUONEB) 0.5-2.5 (3) MG/3ML SOLN [id number] No USE 1 AMPULE IN NEBULIZER VIA NEBULIZATION EVERY 6 HOURS AS NEEDED Wieters, Hallie C, PA-C Taking Expired 03/06/21 2359   loratadine (CLARITIN) 10 MG tablet [id number] No Take 1 tablet (10  mg total) by mouth daily. Kelli Rakes, MD Taking Active   methocarbamol (ROBAXIN) 500 MG tablet [id number] No Take 1 tablet (500 mg total) by mouth every 8 (eight) hours as needed for muscle spasms. Kelli Rakes, MD Taking Active   metroNIDAZOLE (METROGEL VAGINAL) 0.75 % vaginal gel [id number]  Place 1 Applicatorful vaginally at bedtime. Kelli Rakes, MD  Active   mupirocin ointment (BACTROBAN) 2 % [id number] No Apply 1 application topically daily.  Patient not taking: Reported on 09/07/2021   Raspet, Derry Skill, PA-C Not Taking Active   naproxen (NAPROSYN) 500 MG tablet [id number] No Take 1 tablet (500 mg total) by mouth 2 (two) times daily with a meal.  Patient not taking: Reported on 09/07/2021   Kelli Rakes, MD Not Taking Active   ondansetron (ZOFRAN-ODT) 4 MG disintegrating tablet [id number] No Take 1 tablet (4 mg total) by mouth every 8 (eight) hours as needed for nausea or vomiting.  Patient not taking: Reported on 09/07/2021   Brunetta Jeans, PA-C Not Taking Active   sertraline (ZOLOFT) 100 MG tablet [id number] No Take 1 tablet by mouth once daily Kelli Rakes, MD Taking Active   traZODone (DESYREL) 50 MG tablet [id number] No Take 1 tablet (50 mg total) by mouth at bedtime as needed for sleep. Kelli Rakes, MD Taking Active   Vitamin D, Ergocalciferol, (DRISDOL) 1.25 MG (50000 UNIT) CAPS capsule [id number]  Take 1 capsule (50,000 Units total) by mouth every 7 (seven) days. Kelli Rakes, MD  Active             Patient Active Problem List   Diagnosis Date Noted   Stress 01/10/2021   Psychophysiological insomnia 01/10/2021   Abnormal cervical Papanicolaou smear 01/09/2021   Asthma    Hypertension 12/14/2015   Anxiety and depression 12/14/2015   Cholecystitis with cholelithiasis 12/17/2014   Non-reactive NST (non-stress test)    Esophageal reflux 02/02/2013   Anxiety state 05/02/2011   Attention deficit hyperactivity disorder (ADHD) 05/02/2011    Conditions to be addressed/monitored per PCP order:  Anxiety and Depression  Care Plan : LCSW Plan of Care  Updates made by Greg Cutter, LCSW since 11/19/2021 12:00 AM     Problem: Anxiety Identification (Anxiety)      Long-Range Goal: Anxiety Symptoms Identified and Need for Mental Health Support Involvement   Start Date: 10/02/2021  Priority: High  Note:    Priority: High  Timeframe:  Long-Range Goal Priority:  High Start Date:   10/02/21          Expected End Date:  11/19/21          Follow Up Date--Goal closed. Patient has successfully been connected to both psychiatry and counseling at [facility] [place].   - check out counseling - keep 90 percent of counseling appointments - schedule counseling appointment    Why is this important?             Beating depression may take some time.            If you don't feel better right away, don't give up on your treatment plan.    Current barriers:   Chronic Mental Health needs related to depression, stress and anxiety. Patient requires Support, Education, Resources, Referrals, Advocacy, and Care Coordination, in order to meet Unmet Mental Health Needs. Patient will implement clinical interventions discussed today to decrease symptoms of depression and increase knowledge and/or ability of: coping skills. Mental Health Concerns and Social Isolation Patient lacks knowledge of  available community counseling agencies and resources.  Clinical Goal(s): verbalize understanding of plan for management of Anxiety, Depression, and Stress and demonstrate a reduction in symptoms. Patient will connect with a provider for ongoing mental health treatment, increase coping skills, healthy habits, self-management skills, and stress reduction        Clinical Interventions:  Assessed patient's previous and current treatment, coping skills, support system and barriers to care. Patient has NO support network. She was encouraged to consider finding one source of support within her community as building a support network will be helpful at this time to gain socialization and positive support.  Verbalization of feelings encouraged, motivational interviewing employed Emotional support provided, positive coping strategies explored Self care/establishing healthy boundaries emphasized Patient reports that she is on  Wellbutrin but would like to gain psychiatry and counseling as she feels her anxiety and depression is still unmanaged. Patient has poor blood pressure and health due to unmanaged anxiety per [facility] Pharmacist.  Patient reports significant worsening anxiety impacting their ability to function appropriately and carry out daily task. Patient is agreeable to [facility] to [facility] for both [facility] and counseling. Referral placed on 10/02/21. Email was sent to patient with this information as well as other mental health agencies within her area that accept Medicaid. Patient was advised to contact [facility] if she does not hear from them by this Friday. [facility] Pharmacist was updated.  LCSW provided education on relaxation techniques such as meditation, deep breathing, massage, grounding exercises or yoga that can activate the body's relaxation response and ease symptoms of stress and anxiety. LCSW ask that when pt is struggling with difficult emotions and racing thoughts that they start this relaxation response process. LCSW provided extensive education on healthy coping skills for anxiety. SW used active and reflective listening, validated patient's feelings/concerns, and provided emotional support. Patient will work on implementing appropriate self-care habits into their daily routine such as: staying positive, writing a gratitude list, drinking water, staying active around the house, taking their medications as prescribed, combating negative thoughts or emotions and staying connected with their family and friends. Positive reinforcement provided for this decision to work on this. Patient was receptive to anxiety and depression management coping skill education. LCSW provided education on healthy sleep hygiene and what that looks like. LCSW encouraged patient to implement a night time routine into their schedule that works best for them and that they are able to maintain. Advised patient to implement deep  breathing/grounding/meditation/self-care exercises into their nightly routine to combat racing thoughts at night. LCSW encouraged patient to wake up at the same time each day, make their sleeping environment comfortable, exercise when able, to limit naps and to not eat or drink anything right before bed.  Motivational Interviewing employed Depression screen reviewed  PHQ2/ PHQ9 completed Mindfulness or Relaxation training provided Active listening / Reflection utilized  Advance Care and HCPOA education provided Emotional Support Provided Problem [place] strategies reviewed Provided psychoeducation for mental health needs  Provided brief CBT  Reviewed mental health medications and discussed importance of compliance:  Quality of sleep assessed & Sleep Hygiene techniques promoted  Participation in counseling encouraged  Verbalization of feelings encouraged  Suicidal Ideation/Homicidal Ideation assessed: Patient denies SI/HI  Review resources, discussed options and provided patient information about  [place] care team collaboration (see longitudinal plan of care) UPDATE 10/17/21- Patient was educated on healthy coping skills to implement into her daily life to combat anxiety and depression. Patient successfully  scheduled both counseling and [facility] appointments at [facility]. Patient is unable to state the dates of these appointments but reports that they are scheduled for this month and she is hopeful that work will allow her time off to complete these. [facility] LCSW will follow up next month to ensure that patient was successfully well established with a long term mental health provider. Patient reports ongoing issues with insomnia and need for relief.  UPDATE 11/19/21- Patient has completed one appointment at [facility] [place] so far. She is on the wait list for virtual counseling but is able to gain psychiatry now. She reports  that all her clinical social work needs are met now. Patient is agreeable to case closure. [facility] LCSW will update team and sign off. Patient will contact this [facility] LCSW directly if any future social work concerns arise. Email sent to patient with contact information for her to keep.  Patient Goals/Self-Care Activities: Over the next 120 days Attend scheduled medical appointments Utilize healthy coping skills and supportive resources discussed Contact PCP with any questions or concerns Keep 90 percent of counseling appointments Call your insurance provider for more information about your Enhanced Benefits  Check out counseling resources provided  Begin personal counseling with LCSW, to reduce and manage symptoms of Depression and Stress, until well-established with mental health provider Accept all calls from representative with [facility] in an effort to establish ongoing mental health counseling and supportive services. Incorporate into daily practice - relaxation techniques, deep breathing exercises, and mindfulness meditation strategies. Talk about feelings with friends, family members, spiritual advisor, etc. Contact LCSW directly [phone]), if you have questions, need assistance, or if additional social work needs are identified between now and our next scheduled telephone outreach call. Call [phone] for mental health hotline/crisis line if needed (24/7 available) Try techniques to reduce symptoms of anxiety/negative thinking (deep breathing, distraction, positive self talk, etc)  - develop a personal safety plan - develop a plan to deal with triggers like holidays, anniversaries - exercise at least 2 to 3 times per week - have a plan for how to handle bad days - journal feelings and what helps to feel better or worse - spend time or talk with others at least 2 to 3 times per week - watch for early signs of feeling worse - begin personal counseling - call and visit an old friend - check out  volunteer opportunities - join a support group - laugh; watch a funny movie or comedian - learn and use visualization or guided imagery - perform a random act of kindness - practice relaxation or meditation daily - start or continue a personal journal - practice positive thinking and self-talk -continue with compliance of taking medication  -identify current effective and ineffective coping strategies.  -implement positive self-talk in care to increase self-esteem, confidence and feelings of control.  -consider alternative and complementary therapy approaches such as meditation, mindfulness or yoga.  -journaling, prayer, worship services, meditation or pastoral counseling.  -increase participation in pleasurable group activities such as hobbies, singing, sports or volunteering).  -consider the use of meditative movement therapy such as tai chi, yoga or qigong.  -start a regular daily exercise program based on tolerance, ability and patient choice to support positive thinking and activity     The following coping skill education was provided for stress relief and mental health management: "When your car dies or a deadline looms, how do you respond? Long-term, low-grade or acute stress takes a serious  toll on your body and mind, so don't ignore feelings of constant tension. Stress is a natural part of life. However, too much stress can harm our health, especially if it continues every day. This is chronic stress and can put you at risk for heart problems like heart disease and depression. Understand what's happening inside your body and learn simple coping skills to combat the negative impacts of everyday stressors.  Types of Stress There are two types of stress: Emotional - types of emotional stress are relationship problems, pressure at work, financial worries, experiencing discrimination or having a major life change. Physical - Examples of physical stress include being sick having pain, not  sleeping well, recovery from an injury or having an alcohol and drug use disorder. Fight or Flight Sudden or ongoing stress activates your nervous system and floods your bloodstream with adrenaline and cortisol, two hormones that raise blood pressure, increase heart rate and spike blood sugar. These changes pitch your body into a fight or flight response. That enabled our ancestors to outrun saber-toothed tigers, and it's helpful today for situations like dodging a car accident. But most modern chronic stressors, such as finances or a challenging relationship, keep your body in that heightened state, which hurts your health. Effects of Too Much Stress If constantly under stress, most of [place] will eventually start to function less well.  Multiple studies link chronic stress to a higher risk of heart disease, stroke, depression, weight gain, memory loss and even premature death, so it's important to recognize the warning signals. Talk to your doctor about ways to manage stress if you're experiencing any of these symptoms: Prolonged periods of poor sleep. Regular, severe headaches. Unexplained weight loss or gain. Feelings of isolation, withdrawal or worthlessness. Constant anger and irritability. Loss of interest in activities. Constant worrying or obsessive thinking. Excessive alcohol or drug use. Inability to concentrate.  10 Ways to Cope with Chronic Stress It's key to recognize stressful situations as they occur because it allows you to focus on managing how you react. We all need to know when to close our eyes and take a deep breath when we feel tension rising. Use these tips to prevent or reduce chronic stress. 1. Rebalance Work and Home All work and no play? If you're spending too much time at the office, intentionally put more dates in your calendar to enjoy time for fun, either alone or with others. 2. Get Regular Exercise Moving your body on a regular basis balances the nervous system and  increases blood circulation, helping to flush out stress hormones. Even a daily 20-minute walk makes a difference. Any kind of exercise can lower stress and improve your mood ? just pick activities that you enjoy and make it a regular habit. 3. Eat Well and Limit Alcohol and Stimulants Alcohol, nicotine and caffeine may temporarily relieve stress but have negative health impacts and can make stress worse in the long run. Well-nourished bodies cope better, so start with a good breakfast, add more organic fruits and vegetables for a well-balanced diet, avoid processed foods and sugar, try herbal tea and drink more water. 4. Connect with Supportive People Talking face to face with another person releases hormones that reduce stress. Lean on those good listeners in your life. 5. [place] Time Do you enjoy gardening, reading, listening to music or some other creative pursuit? Engage in activities that bring you pleasure and joy; research shows that reduces stress by almost half and lowers your heart rate,  too. 6. Practice Meditation, Stress Reduction or Yoga Relaxation techniques activate a state of restfulness that counterbalances your body's fight-or-flight hormones. Even if this also means a 10-minute break in a long day: listen to music, read, go for a walk in nature, do a hobby, take a bath or spend time with a friend. Also consider doing a mindfulness exercise or try a daily deep breathing or imagery practice. Deep Breathing Slow, calm and deep breathing can help you relax. Try these steps to focus on your breathing and repeat as needed. Find a comfortable position and close your eyes. Exhale and drop your shoulders. Breathe in through your nose; fill your lungs and then your belly. Think of relaxing your body, quieting your mind and becoming calm and peaceful. Breathe out slowly through your nose, relaxing your belly. Think of releasing tension, pain, worries or distress. Repeat steps three  and four until you feel relaxed. Imagery This involves using your mind to excite the senses -- sound, vision, smell, taste and feeling. This may help ease your stress. Begin by getting comfortable and then do some slow breathing. Imagine a place you love being at. It could be somewhere from your childhood, somewhere you vacationed or just a place in your imagination. Feel how it is to be in the place you're imagining. Pay attention to the sounds, air, colors, and who is there with you. This is a place where you feel cared for and loved. All is well. You are safe. Take in all the smells, sounds, tastes and feelings. As you do, feel your body being nourished and healed. Feel the calm that surrounds you. Breathe in all the good. Breathe out any discomfort or tension. 7. Sleep Enough If you get less than seven to eight hours of sleep, your body won't tolerate stress as well as it could. If stress keeps you up at night, address the cause, and add extra meditation into your day to make up for the lost z's. Try to get seven to nine hours of sleep each night. Make a regular bedtime schedule. Keep your room dark and cool. Try to avoid computers, TV, cell phones and tablets before bed. 8. Bond with Connections You Enjoy Go out for a coffee with a friend, chat with a neighbor, call a family member, visit with a clergy member, or even hang out with your pet. Clinical studies show that spending even a short time with a companion animal can cut anxiety levels almost in half. 9. Take a Vacation Getting away from it all can reset your stress tolerance by increasing your mental and emotional outlook, which makes you a happier, more productive person upon return. Leave your cellphone and laptop at home! 10. See a Counselor, Coach or Therapist If negative thoughts overwhelm your ability to make positive changes, it's time to seek professional help. Make an appointment today--your health and life are worth it."  10  LITTLE Things To Do When You're Feeling Too Down To Do Anything  Take a shower. Even if you plan to stay in all day long and not see a soul, take a shower. It takes the most effort to hop in to the shower but once you do, you'll feel immediate results. It will wake you up and you'll be feeling much fresher (and cleaner too).  Brush and floss your teeth. Give your teeth a good brushing with a floss finish. It's a small task but it feels so good and you can check 'taking care of  your health' off the list of things to do.  Do something small on your list. Most of [place] have some small thing we would like to get done (load of laundry, sew a button, email a friend). Doing one of these things will make you feel like you've accomplished something.  Drink water. Drinking water is easy right? It's also really beneficial for your health so keep a glass beside you all day and take sips often. It gives you energy and prevents you from boredom eating.  Do some floor exercises. The last thing you want to do is exercise but it might be just the thing you need the most. Keep it simple and do exercises that involve sitting or laying on the floor. Even the smallest of exercises release chemicals in the brain that make you feel good. Yoga stretches or core exercises are going to make you feel good with minimal effort.  Make your bed. Making your bed takes a few minutes but it's productive and you'll feel relieved when it's done. An unmade bed is a huge visual reminder that you're having an unproductive day. Do it and consider it your housework for the day.  Put on some nice clothes. Take the sweatpants off even if you don't plan to go anywhere. Put on clothes that make you feel good. Take a look in the mirror so your brain recognizes the sweatpants have been replaced with clothes that make you look great. It's an instant confidence booster.  Wash the dishes. A pile of dirty dishes in the sink is a reflection of  your mood. It's possible that if you wash up the dishes, your mood will follow suit. It's worth a try.  Cook a real meal. If you have the luxury to have a "do nothing" day, you have time to make a real meal for yourself. Make a meal that you love to eat. The process is good to get you out of the funk and the food will ensure you have more energy for tomorrow.  Write out your thoughts by hand. When you hand write, you stimulate your brain to focus on the moment that you're in so make yourself comfortable and write whatever comes into your mind. Put those thoughts out on paper so they stop spinning around in your head. Those thoughts might be the very thing holding you down.     10/02/2021   12:58 PM 07/10/2021    3:08 PM 04/09/2021   11:46 AM 02/26/2021    4:35 PM 01/09/2021   10:13 AM  Depression screen PHQ 2/9  Decreased Interest 1 1 1 2 3   Down, Depressed, Hopeless 1 1 1 2 3   PHQ - 2 Score 2 2 2 4 6   Altered sleeping 3 3 3 3 3   Tired, decreased energy 3 3 2 3 3   Change in appetite 3 3 2 2 3   Feeling bad or failure about yourself  0 0 1 2 3   Trouble concentrating 1 2 2 1 3   Moving slowly or fidgety/restless 0 3 2 3 3   Suicidal thoughts 0 0 0 0 3  PHQ-9 Score 12 16 14 18 27   Difficult doing work/chores Somewhat difficult          24- Hour Availability:    [facility]  [address] [place], [place]   [facility] [employer] [place] [facility]  [place]  [facility]  [phone] (after hours)   Therapeutic Alternative/Mobile Crisis   [phone]   [place] National Suicide Hotline  [phone] Diamantina Monks) OR [phone]   Call 911 or go to emergency room   [facility]  [phone]);  [place] and [employer]  [phone]); [place], [place], [place], [place], [place], [place], [place]         Patient Goals:  Follow up goal      Follow up:  Patient requests no follow-up at this time.  Plan: The Managed Medicaid care management team is available to follow up with the patient after provider conversation with the patient regarding recommendation for Social Work care management engagement and subsequent re-referral to the care management team.   Eula Fried, BSW, MSW, LCSW Managed Medicaid [email or web address] Phone: [phone]

## 2021-11-23 DIAGNOSIS — F411 Generalized anxiety disorder: Secondary | ICD-10-CM | POA: Diagnosis not present

## 2021-11-28 ENCOUNTER — Ambulatory Visit: Payer: Medicaid Other | Admitting: Family Medicine

## 2021-12-06 DIAGNOSIS — F411 Generalized anxiety disorder: Secondary | ICD-10-CM | POA: Diagnosis not present

## 2021-12-10 ENCOUNTER — Other Ambulatory Visit: Payer: Self-pay

## 2021-12-12 ENCOUNTER — Other Ambulatory Visit: Payer: Self-pay

## 2021-12-19 DIAGNOSIS — F411 Generalized anxiety disorder: Secondary | ICD-10-CM | POA: Diagnosis not present

## 2021-12-21 ENCOUNTER — Encounter: Payer: Self-pay | Admitting: Family Medicine

## 2021-12-21 DIAGNOSIS — E559 Vitamin D deficiency, unspecified: Secondary | ICD-10-CM | POA: Insufficient documentation

## 2021-12-25 DIAGNOSIS — Z3046 Encounter for surveillance of implantable subdermal contraceptive: Secondary | ICD-10-CM | POA: Diagnosis not present

## 2021-12-28 ENCOUNTER — Encounter (HOSPITAL_BASED_OUTPATIENT_CLINIC_OR_DEPARTMENT_OTHER): Payer: Medicaid Other | Admitting: Internal Medicine

## 2022-01-02 ENCOUNTER — Other Ambulatory Visit: Payer: Self-pay | Admitting: Family Medicine

## 2022-01-02 ENCOUNTER — Encounter (HOSPITAL_BASED_OUTPATIENT_CLINIC_OR_DEPARTMENT_OTHER): Payer: Medicaid Other | Admitting: Family Medicine

## 2022-01-02 DIAGNOSIS — L732 Hidradenitis suppurativa: Secondary | ICD-10-CM | POA: Diagnosis not present

## 2022-01-03 MED ORDER — DOXYCYCLINE HYCLATE 100 MG PO TABS: 100.0000 mg | ORAL_TABLET | Freq: Two times a day (BID) | ORAL | 0 refills | Status: DC

## 2022-01-03 MED ORDER — FLUCONAZOLE 150 MG PO TABS: 150.0000 mg | ORAL_TABLET | Freq: Once | ORAL | 0 refills | Status: AC

## 2022-01-03 NOTE — Telephone Encounter (Signed)
Please see the MyChart message reply(ies) for my assessment and plan.    This patient gave consent for this Medical Advice Message and is aware that it may result in a bill to their insurance company, as well as the possibility of receiving a bill for a co-payment or deductible. They are an established patient, but are not seeking medical advice exclusively about a problem treated during an in person or video visit in the last seven days. I did not recommend an in person or video visit within seven days of my reply.    I spent a total of 6 minutes cumulative time within 7 days through MyChart messaging.  Hulda Reddix, MD   

## 2022-01-03 NOTE — Telephone Encounter (Signed)
Will forward to provider  

## 2022-01-04 MED ORDER — METRONIDAZOLE 0.75 % VA GEL: 1.0000 | Freq: Every day | VAGINAL | 0 refills | Status: DC

## 2022-01-08 ENCOUNTER — Encounter: Payer: Self-pay | Admitting: Family Medicine

## 2022-01-08 ENCOUNTER — Ambulatory Visit: Payer: Medicaid Other | Attending: Family Medicine | Admitting: Family Medicine

## 2022-01-08 VITALS — BP 136/89 | Temp 97.9°F | Ht 62.0 in | Wt 191.2 lb

## 2022-01-08 DIAGNOSIS — F17290 Nicotine dependence, other tobacco product, uncomplicated: Secondary | ICD-10-CM

## 2022-01-08 DIAGNOSIS — Z131 Encounter for screening for diabetes mellitus: Secondary | ICD-10-CM

## 2022-01-08 DIAGNOSIS — F419 Anxiety disorder, unspecified: Secondary | ICD-10-CM

## 2022-01-08 DIAGNOSIS — J4520 Mild intermittent asthma, uncomplicated: Secondary | ICD-10-CM | POA: Insufficient documentation

## 2022-01-08 DIAGNOSIS — Z22330 Carrier of Group B streptococcus: Secondary | ICD-10-CM | POA: Insufficient documentation

## 2022-01-08 DIAGNOSIS — M62838 Other muscle spasm: Secondary | ICD-10-CM

## 2022-01-08 DIAGNOSIS — T781XXA Other adverse food reactions, not elsewhere classified, initial encounter: Secondary | ICD-10-CM | POA: Insufficient documentation

## 2022-01-08 DIAGNOSIS — J309 Allergic rhinitis, unspecified: Secondary | ICD-10-CM | POA: Insufficient documentation

## 2022-01-08 DIAGNOSIS — F32A Depression, unspecified: Secondary | ICD-10-CM

## 2022-01-08 DIAGNOSIS — R7303 Prediabetes: Secondary | ICD-10-CM

## 2022-01-08 DIAGNOSIS — I10 Essential (primary) hypertension: Secondary | ICD-10-CM | POA: Diagnosis not present

## 2022-01-08 DIAGNOSIS — J3081 Allergic rhinitis due to animal (cat) (dog) hair and dander: Secondary | ICD-10-CM | POA: Insufficient documentation

## 2022-01-08 DIAGNOSIS — B9689 Other specified bacterial agents as the cause of diseases classified elsewhere: Secondary | ICD-10-CM

## 2022-01-08 DIAGNOSIS — H1045 Other chronic allergic conjunctivitis: Secondary | ICD-10-CM | POA: Insufficient documentation

## 2022-01-08 DIAGNOSIS — J301 Allergic rhinitis due to pollen: Secondary | ICD-10-CM | POA: Insufficient documentation

## 2022-01-08 DIAGNOSIS — N760 Acute vaginitis: Secondary | ICD-10-CM

## 2022-01-08 LAB — POCT GLYCOSYLATED HEMOGLOBIN (HGB A1C): HbA1c, POC (controlled diabetic range): 5.5 % (ref 0.0–7.0)

## 2022-01-08 MED ORDER — AMLODIPINE BESYLATE 10 MG PO TABS: 10.0000 mg | ORAL_TABLET | Freq: Every day | ORAL | 1 refills | Status: DC

## 2022-01-08 MED ORDER — CARVEDILOL 25 MG PO TABS: 25.0000 mg | ORAL_TABLET | Freq: Two times a day (BID) | ORAL | 6 refills | Status: DC

## 2022-01-08 MED ORDER — METRONIDAZOLE 0.75 % VA GEL: VAGINAL | 6 refills | Status: DC

## 2022-01-08 MED ORDER — BUPROPION HCL ER (XL) 300 MG PO TB24
300.0000 mg | ORAL_TABLET | Freq: Every morning | ORAL | 0 refills | Status: AC
Start: 2022-01-08 — End: ?

## 2022-01-08 MED ORDER — METHOCARBAMOL 500 MG PO TABS: 500.0000 mg | ORAL_TABLET | Freq: Three times a day (TID) | ORAL | 1 refills | Status: DC | PRN
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
## 2022-01-08 NOTE — Patient Instructions (Signed)

## 2022-01-17 ENCOUNTER — Encounter: Payer: Self-pay | Admitting: Family Medicine

## 2022-01-17 ENCOUNTER — Other Ambulatory Visit: Payer: Self-pay | Admitting: Family Medicine

## 2022-01-18 ENCOUNTER — Ambulatory Visit (HOSPITAL_COMMUNITY)
Admission: RE | Admit: 2022-01-18 | Discharge: 2022-01-18 | Disposition: A | Discharge status: Home / Self Care | Payer: Medicaid Other | Source: Ambulatory Visit | Attending: Internal Medicine | Admitting: Internal Medicine

## 2022-01-18 ENCOUNTER — Encounter (HOSPITAL_COMMUNITY): Payer: Self-pay

## 2022-01-18 VITALS — BP 156/100 | HR 74 | Temp 97.7°F | Resp 18

## 2022-01-18 DIAGNOSIS — R638 Other symptoms and signs concerning food and fluid intake: Secondary | ICD-10-CM | POA: Diagnosis not present

## 2022-01-18 DIAGNOSIS — R350 Frequency of micturition: Secondary | ICD-10-CM | POA: Diagnosis not present

## 2022-01-18 DIAGNOSIS — F411 Generalized anxiety disorder: Secondary | ICD-10-CM | POA: Diagnosis not present

## 2022-01-18 LAB — POCT URINALYSIS DIPSTICK, ED / UC
Bilirubin Urine: NEGATIVE
Glucose, UA: NEGATIVE mg/dL
Ketones, ur: NEGATIVE mg/dL
Leukocytes,Ua: NEGATIVE
Nitrite: NEGATIVE
Protein, ur: NEGATIVE mg/dL
Specific Gravity, Urine: 1.02 (ref 1.005–1.030)
Urobilinogen, UA: 1 mg/dL (ref 0.0–1.0)
pH: 6.5 (ref 5.0–8.0)

## 2022-01-18 NOTE — ED Triage Notes (Signed)
Pt c/o urinary urgency, frequency and feels not emptying bladder all the way for a couple days. Concerned for UTI. Pt reports drinks lots of sodas.

## 2022-01-18 NOTE — ED Provider Notes (Signed)
[facility]    CSN: [id number] Arrival date & time: 01/18/22  1731     History   Chief Complaint Chief Complaint  Patient presents with   Urinary Frequency    Possible bladder infection - Entered by patient   Urinary Urgency    HPI Kelli Wells is a 36 y.o. female.  Presents with urinary frequency and urgency that began yesterday. Reports she feels like she is not completely emptying her bladder.  Denies dysuria, hematuria, flank pain, fever, abdominal pain, vomiting/diarrhea. No vaginal symptoms, recent STD testing all negative.  Ports she does not drink water. She drinks lots of sodas   Past Medical History:  Diagnosis Date   Anxiety    Asthma    Chronic hypertension in pregnancy    Depression    GERD (gastroesophageal reflux disease)    Hypertension    Infection    UTI   Pregnancy induced hypertension    Vaginal Pap smear, abnormal    LEEP, normal since    Patient Active Problem List   Diagnosis Date Noted   Mild intermittent asthma 01/08/2022   Chronic allergic conjunctivitis 01/08/2022   Allergic rhinitis due to pollen 01/08/2022   Allergic rhinitis due to animal (cat) (dog) hair and dander 01/08/2022   Allergic rhinitis 01/08/2022   Adverse reaction to food 01/08/2022   Carrier of group B Streptococcus 01/08/2022   Vitamin D deficiency 12/21/2021   Stress 01/10/2021   Psychophysiological insomnia 01/10/2021   Abnormal cervical Papanicolaou smear 01/09/2021   Asthma    Cholecystitis with cholelithiasis 12/17/2014   Non-reactive NST (non-stress test)    Postpartum depression 02/02/2013   Esophageal reflux 02/02/2013   Hypertensive disorder 05/02/2011   Anxiety state 05/02/2011   Attention deficit hyperactivity disorder (ADHD) 05/02/2011    Past Surgical History:  Procedure Laterality Date   CHOLECYSTECTOMY N/A 12/18/2014   Procedure: LAPAROSCOPIC CHOLECYSTECTOMY WITH INTRAOPERATIVE CHOLANGIOGRAM;  Surgeon: Chevis Pretty III, MD;   Location: [facility] OR;  Service: General;  Laterality: N/A;   LEEP     LYMPH GLAND EXCISION      OB History     Gravida  2   Para  2   Term  2   Preterm  0   AB  0   Living  2      SAB  0   IAB  0   Ectopic  0   Multiple  0   Live Births  2            Home Medications    Prior to Admission medications   Medication Sig Start Date End Date Taking? Authorizing Provider  albuterol (VENTOLIN HFA) 108 (90 Base) MCG/ACT inhaler Inhale 1-2 puffs into the lungs every 6 (six) hours as needed for wheezing or shortness of breath. 08/02/20   Hoy Register, MD  amLODipine (NORVASC) 10 MG tablet Take 1 tablet (10 mg total) by mouth daily. 01/08/22   Hoy Register, MD  Blood Pressure Monitoring (BLOOD PRESSURE CUFF) MISC Use to check blood pressure once daily. 09/07/21   Hoy Register, MD  buPROPion (WELLBUTRIN XL) 300 MG 24 hr tablet Take 1 tablet (300 mg total) by mouth every morning. 01/08/22   Hoy Register, MD  carvedilol (COREG) 25 MG tablet Take 1 tablet (25 mg total) by mouth 2 (two) times daily with a meal. 01/08/22   Hoy Register, MD  clindamycin (CLEOCIN T) 1 % external solution Apply topically 2 (two) times daily. For hidradenitis 07/10/21  Hoy Register, MD  doxycycline (VIBRA-TABS) 100 MG tablet Take 1 tablet (100 mg total) by mouth 2 (two) times daily. 01/03/22   Hoy Register, MD  EPINEPHrine (EPIPEN 2-PAK) 0.3 mg/0.3 mL IJ SOAJ injection Inject 0.3 mLs (0.3 mg total) into the muscle as needed for anaphylaxis. 09/08/19   Zadie Rhine, MD  fluticasone (FLONASE) 50 MCG/ACT nasal spray Place 2 sprays into both nostrils daily. 06/21/19   Hoy Register, MD  hydrOXYzine (ATARAX) 10 MG tablet Take 1 tablet (10 mg total) by mouth 3 (three) times daily as needed. 10/26/21   Hoy Register, MD  ipratropium-albuterol (DUONEB) 0.5-2.5 (3) MG/3ML SOLN Take 3 mLs by nebulization every 6 (six) hours as needed. 03/06/20   Wieters, Hallie C, PA-C  ipratropium-albuterol  (DUONEB) 0.5-2.5 (3) MG/3ML SOLN USE 1 AMPULE IN NEBULIZER VIA NEBULIZATION EVERY 6 HOURS AS NEEDED 03/06/20 03/06/21  Wieters, Hallie C, PA-C  loratadine (CLARITIN) 10 MG tablet Take 1 tablet (10 mg total) by mouth daily. 10/20/18   Hoy Register, MD  methocarbamol (ROBAXIN) 500 MG tablet Take 1 tablet (500 mg total) by mouth every 8 (eight) hours as needed for muscle spasms. 01/08/22   Hoy Register, MD  metroNIDAZOLE (METROGEL VAGINAL) 0.75 % vaginal gel Insert twice a week for 6 months bacterial vaginosis prophylaxis 01/08/22   Hoy Register, MD  mupirocin ointment (BACTROBAN) 2 % Apply 1 application topically daily. 07/06/21   Raspet, Noberto Retort, PA-C  naproxen (NAPROSYN) 500 MG tablet Take 1 tablet (500 mg total) by mouth 2 (two) times daily with a meal. 09/29/18   Newlin, Enobong, MD  ondansetron (ZOFRAN-ODT) 4 MG disintegrating tablet Take 1 tablet (4 mg total) by mouth every 8 (eight) hours as needed for nausea or vomiting. 04/10/21   Waldon Merl, PA-C  sertraline (ZOLOFT) 100 MG tablet Take 1 tablet by mouth once daily 07/23/21   Hoy Register, MD  traZODone (DESYREL) 50 MG tablet Take 1 tablet (50 mg total) by mouth at bedtime as needed for sleep. 05/22/21   Hoy Register, MD  Vitamin D, Ergocalciferol, (DRISDOL) 1.25 MG (50000 UNIT) CAPS capsule Take 1 capsule (50,000 Units total) by mouth every 7 (seven) days. 11/12/21   Hoy Register, MD    Family History Family History  Problem Relation Age of Onset   Hypertension Maternal Grandmother    Cancer Maternal Grandmother        breast   Hypertension Maternal Grandfather    Heart attack Maternal Grandfather    Kidney disease Paternal Grandmother    Heart disease Paternal Grandfather    Hypertension Mother    Cancer Mother        rectal   Hearing loss Neg Hx     Social History Social History   Tobacco Use   Smoking status: Every Day    Packs/day: 0.25    Types: Cigarettes   Smokeless tobacco: Never  Vaping Use   Vaping  Use: Never used  Substance Use Topics   Alcohol use: Yes    Alcohol/week: 2.0 - 3.0 standard drinks of alcohol    Types: 2 - 3 Glasses of wine per week    Comment: occas   Drug use: No     Allergies   Sulfa antibiotics, Other, Shellfish allergy, and Shrimp (diagnostic)   Review of Systems Review of Systems  Genitourinary:  Positive for frequency.   Per HPI  Physical Exam Triage Vital Signs ED Triage Vitals  Enc Vitals Group     BP 01/18/22 1805 Marland Kitchen)  156/100     Pulse Rate 01/18/22 1805 74     Resp 01/18/22 1805 18     Temp 01/18/22 1805 97.7 F (36.5 C)     Temp Source 01/18/22 1805 Oral     SpO2 01/18/22 1805 98 %     Weight --      Height --      Head Circumference --      Peak Flow --      Pain Score 01/18/22 1804 0     Pain Loc --      Pain Edu? --      Excl. in GC? --    No data found.  Updated Vital Signs BP (!) 156/100 (BP Location: Left Arm)   Pulse 74   Temp 97.7 F (36.5 C) (Oral)   Resp 18   SpO2 98%     Physical Exam Vitals and nursing note reviewed.  Constitutional:      General: She is not in acute distress.    Comments: Very pleasant and polite 36 year old female  HENT:     Mouth/Throat:     Pharynx: Oropharynx is clear.  Eyes:     Conjunctiva/sclera: Conjunctivae normal.  Cardiovascular:     Rate and Rhythm: Normal rate and regular rhythm.     Pulses: Normal pulses.  Pulmonary:     Effort: Pulmonary effort is normal.  Abdominal:     Palpations: Abdomen is soft.     Tenderness: There is no abdominal tenderness. There is no right CVA tenderness or left CVA tenderness.  Neurological:     Mental Status: She is alert and oriented to person, place, and time.     UC Treatments / Results  Labs (all labs ordered are listed, but only abnormal results are displayed) Labs Reviewed  POCT URINALYSIS DIPSTICK, ED / UC - Abnormal; Notable for the following components:      Result Value   Hgb urine dipstick TRACE (*)    All other  components within normal limits    EKG   Radiology No results found.  Procedures Procedures (including critical care time)  Medications Ordered in UC Medications - No data to display  Initial Impression / Assessment and Plan / UC Course  I have reviewed the triage vital signs and the nursing notes.  Pertinent labs & imaging results that were available during my care of the patient were reviewed by me and considered in my medical decision making (see chart for details).  Urinalysis unremarkable without sign of infection. Physical exam benign. Symptoms are likely due to soda intake without any water intake. Discussed increasing water intake, working her way up to at least 64 ounces daily and monitoring symptoms.  She is welcome to return to urgent care if symptoms persist despite adequate fluid intake, or if symptoms worsen. Patient agrees to plan  Final Clinical Impressions(s) / UC Diagnoses   Final diagnoses:  Urinary frequency  Inadequate fluid intake     Discharge Instructions      I recommend drinking at least 64 oz of water daily!  This will help a lot with your urinary symptoms. Soda can cause some of the discomfort you're feeling.  Please return to the urgent care with any concerns or persistent symptoms.      ED Prescriptions   None    PDMP not reviewed this encounter.   Antone Summons, Ray Church 01/18/22 1902

## 2022-01-18 NOTE — Discharge Instructions (Addendum)
I recommend drinking at least 64 oz of water daily!  This will help a lot with your urinary symptoms. Soda can cause some of the discomfort you're feeling.  Please return to the urgent care with any concerns or persistent symptoms.

## 2022-03-07 DIAGNOSIS — Z23 Encounter for immunization: Secondary | ICD-10-CM | POA: Diagnosis not present

## 2022-04-17 DIAGNOSIS — J301 Allergic rhinitis due to pollen: Secondary | ICD-10-CM | POA: Diagnosis not present

## 2022-04-17 DIAGNOSIS — J3089 Other allergic rhinitis: Secondary | ICD-10-CM | POA: Diagnosis not present

## 2022-04-17 DIAGNOSIS — J4520 Mild intermittent asthma, uncomplicated: Secondary | ICD-10-CM | POA: Diagnosis not present

## 2022-04-17 DIAGNOSIS — J3081 Allergic rhinitis due to animal (cat) (dog) hair and dander: Secondary | ICD-10-CM | POA: Diagnosis not present

## 2022-04-17 DIAGNOSIS — H1045 Other chronic allergic conjunctivitis: Secondary | ICD-10-CM | POA: Diagnosis not present

## 2022-04-24 DIAGNOSIS — J3081 Allergic rhinitis due to animal (cat) (dog) hair and dander: Secondary | ICD-10-CM | POA: Diagnosis not present

## 2022-04-24 DIAGNOSIS — J301 Allergic rhinitis due to pollen: Secondary | ICD-10-CM | POA: Diagnosis not present

## 2022-04-25 DIAGNOSIS — J3089 Other allergic rhinitis: Secondary | ICD-10-CM | POA: Diagnosis not present

## 2022-04-26 DIAGNOSIS — F411 Generalized anxiety disorder: Secondary | ICD-10-CM | POA: Diagnosis not present

## 2022-04-30 ENCOUNTER — Other Ambulatory Visit: Payer: Self-pay

## 2022-05-28 DIAGNOSIS — J3081 Allergic rhinitis due to animal (cat) (dog) hair and dander: Secondary | ICD-10-CM | POA: Diagnosis not present

## 2022-05-28 DIAGNOSIS — J3089 Other allergic rhinitis: Secondary | ICD-10-CM | POA: Diagnosis not present

## 2022-05-28 DIAGNOSIS — J301 Allergic rhinitis due to pollen: Secondary | ICD-10-CM | POA: Diagnosis not present

## 2022-05-30 DIAGNOSIS — J301 Allergic rhinitis due to pollen: Secondary | ICD-10-CM | POA: Diagnosis not present

## 2022-05-30 DIAGNOSIS — J3089 Other allergic rhinitis: Secondary | ICD-10-CM | POA: Diagnosis not present

## 2022-05-30 DIAGNOSIS — J3081 Allergic rhinitis due to animal (cat) (dog) hair and dander: Secondary | ICD-10-CM | POA: Diagnosis not present

## 2022-06-03 DIAGNOSIS — J3089 Other allergic rhinitis: Secondary | ICD-10-CM | POA: Diagnosis not present

## 2022-06-03 DIAGNOSIS — J301 Allergic rhinitis due to pollen: Secondary | ICD-10-CM | POA: Diagnosis not present

## 2022-06-03 DIAGNOSIS — J3081 Allergic rhinitis due to animal (cat) (dog) hair and dander: Secondary | ICD-10-CM | POA: Diagnosis not present

## 2022-06-11 ENCOUNTER — Telehealth: Payer: Medicaid Other | Admitting: Family Medicine

## 2022-06-11 ENCOUNTER — Ambulatory Visit: Payer: Self-pay

## 2022-06-11 DIAGNOSIS — U071 COVID-19: Secondary | ICD-10-CM

## 2022-06-11 NOTE — Telephone Encounter (Signed)
  Chief Complaint: COVID persistent sx  Symptoms: fatigue and night time congestion Frequency: ongoing 1 week  Pertinent Negatives: Patient denies cough, SOB, fever  Disposition: [] ED /[] Urgent Care (no appt availability in office) / [] Appointment(In office/virtual)/ [x]  [facility]/ [] Home Care/ [] Refused Recommended Disposition /[] [facility]/ []  Follow-up with PCP Additional Notes: pt tested positive last Wed at home. Didn't do VV for antivirals d/t had bad experience last time. Pt has been taking Dayquil and Nyquil but not helping with persisting sx. Pt asking what else she can do besides care advice. No appts available so scheduled pt for virtual UC visit for today at 1215.   Summary: Weakness, lathargy   The patient called in stating she is trying to get over covid in which she was diagnosed last week with. She is still feeling lingering effects as far as being tired and she is supposed to go bad to work tomorrow. If she doesn't go back she will need a note. Please assist patient further.         Reason for Disposition  [1] PERSISTING SYMPTOMS OF COVID-19 AND [2] NO medical visit for COVID-19 in past 2 weeks  Answer Assessment - Initial Assessment Questions 1. COVID-19 ONSET: "When did the symptoms of COVID-19 first start?"     Wednesday  2. DIAGNOSIS CONFIRMATION: "How were you diagnosed?" (e.g., COVID-19 oral or nasal viral test; COVID-19 antibody test; doctor visit)     Home test  3. MAIN SYMPTOM:  "What is your main concern or symptom right now?" (e.g., breathing difficulty, cough, fatigue. loss of smell)     Fatigue  6. RECENT MEDICAL VISIT: "Have you been seen by a healthcare provider (doctor, NP, PA) for these persisting COVID-19 symptoms?" If Yes, ask: "When were you seen?" (e.g., date)     no 7. COUGH: "Do you have a cough?" If Yes, ask: "How bad is the cough?"       better 8. FEVER: "Do you have a fever?" If Yes, ask: "What is your temperature, how  was it measured, and when did it start?"     better 9. BREATHING DIFFICULTY: "Are you having any trouble breathing?" If Yes, ask: "How bad is your breathing?" (e.g., mild, moderate, severe)    - MILD: No SOB at rest, mild SOB with walking, speaks normally in sentences, can lie down, no retractions, pulse < 100.    - MODERATE: SOB at rest, SOB with minimal exertion and prefers to sit, cannot lie down flat, speaks in phrases, mild retractions, audible wheezing, pulse 100-120.    - SEVERE: Very SOB at rest, speaks in single words, struggling to breathe, sitting hunched forward, retractions, pulse > 120.        10. OTHER SYMPTOMS: "Do you have any other symptoms?"  (e.g., fatigue, headache, muscle pain, weakness)       Fatigue, night time congestion  Protocols used: Coronavirus (COVID-19) Persisting Symptoms Follow-up Call-[facility]

## 2022-06-11 NOTE — Progress Notes (Signed)
[place]  Needs an extended work note secondary to [place] lingering past 5 days and work needing paperwork. Was not seen by [place] last week for COVID- we did not originally take her out of work for it. Discussed need to see PCP for extended notes and work paperwork  Patient acknowledged agreement and understanding of the plan.

## 2022-06-19 DIAGNOSIS — F411 Generalized anxiety disorder: Secondary | ICD-10-CM | POA: Diagnosis not present

## 2022-06-30 ENCOUNTER — Other Ambulatory Visit: Payer: Self-pay | Admitting: Family Medicine

## 2022-06-30 DIAGNOSIS — N930 Postcoital and contact bleeding: Secondary | ICD-10-CM | POA: Diagnosis not present

## 2022-06-30 DIAGNOSIS — L732 Hidradenitis suppurativa: Secondary | ICD-10-CM | POA: Diagnosis not present

## 2022-06-30 DIAGNOSIS — Z113 Encounter for screening for infections with a predominantly sexual mode of transmission: Secondary | ICD-10-CM | POA: Diagnosis not present

## 2022-07-01 MED ORDER — DOXYCYCLINE HYCLATE 100 MG PO TABS: 100.0000 mg | ORAL_TABLET | Freq: Two times a day (BID) | ORAL | 0 refills | Status: DC

## 2022-07-15 ENCOUNTER — Encounter: Payer: Self-pay | Admitting: Family Medicine

## 2022-07-15 ENCOUNTER — Ambulatory Visit: Payer: BC Managed Care – PPO | Attending: Family Medicine | Admitting: Family Medicine

## 2022-07-15 VITALS — BP 129/84 | HR 78 | Temp 98.3°F | Ht 62.0 in | Wt 196.2 lb

## 2022-07-15 DIAGNOSIS — B9689 Other specified bacterial agents as the cause of diseases classified elsewhere: Secondary | ICD-10-CM

## 2022-07-15 DIAGNOSIS — N760 Acute vaginitis: Secondary | ICD-10-CM | POA: Diagnosis not present

## 2022-07-15 DIAGNOSIS — I10 Essential (primary) hypertension: Secondary | ICD-10-CM | POA: Diagnosis not present

## 2022-07-15 DIAGNOSIS — L732 Hidradenitis suppurativa: Secondary | ICD-10-CM

## 2022-07-15 DIAGNOSIS — Z1159 Encounter for screening for other viral diseases: Secondary | ICD-10-CM

## 2022-07-15 DIAGNOSIS — J4520 Mild intermittent asthma, uncomplicated: Secondary | ICD-10-CM

## 2022-07-15 DIAGNOSIS — F17290 Nicotine dependence, other tobacco product, uncomplicated: Secondary | ICD-10-CM | POA: Diagnosis not present

## 2022-07-15 DIAGNOSIS — R29818 Other symptoms and signs involving the nervous system: Secondary | ICD-10-CM

## 2022-07-15 MED ORDER — CLINDAMYCIN PHOSPHATE 1 % EX SOLN: Freq: Two times a day (BID) | CUTANEOUS | 1 refills | Status: AC

## 2022-07-15 MED ORDER — AMLODIPINE BESYLATE 10 MG PO TABS: 10.0000 mg | ORAL_TABLET | Freq: Every day | ORAL | 1 refills | Status: DC

## 2022-07-15 MED ORDER — CARVEDILOL 25 MG PO TABS: 25.0000 mg | ORAL_TABLET | Freq: Two times a day (BID) | ORAL | 1 refills | Status: DC

## 2022-07-15 MED ORDER — METRONIDAZOLE 0.75 % VA GEL: VAGINAL | 6 refills | Status: DC

## 2022-07-15 MED ORDER — ALBUTEROL SULFATE HFA 108 (90 BASE) MCG/ACT IN AERS: 1.0000 | INHALATION_SPRAY | Freq: Four times a day (QID) | RESPIRATORY_TRACT | 6 refills | Status: AC | PRN

## 2022-07-15 NOTE — Progress Notes (Signed)
Subjective:  Patient ID: Kelli Wells, female    DOB: April 01, 1986  Age: 37 y.o. MRN: [id number]  CC: Hypertension   HPI Kelli Wells is a 37 y.o. year old female with a history of hypertension, anxiety and depression, hidradenitis, prediabetes, nicotine dependence, asthma.  Interval History:  Anxiety and depression are managed [facility]. She was placed on Strattera for ADHD but she Complains of being sleepy a lot during the day and is not sure if this is due to the Strattera. She endorses snoring at night as well and had been referred for sleep study last year which she was not able to undergo due to her mother being sick.  Endorses adherence with her antihypertensive and her asthma is controlled. For her hidradenitis she is currently using doxycycline and has had clindamycin topical solution with improvement in symptoms.  She would however like to be referred to a dermatologist for further evaluation. BV is controlled on suppressive treatment with metronidazole gel. Past Medical History:  Diagnosis Date   Anxiety    Asthma    Chronic hypertension in pregnancy    Depression    GERD (gastroesophageal reflux disease)    Hypertension    Infection    UTI   Pregnancy induced hypertension    Vaginal Pap smear, abnormal    LEEP, normal since    Past Surgical History:  Procedure Laterality Date   CHOLECYSTECTOMY N/A 12/18/2014   Procedure: LAPAROSCOPIC CHOLECYSTECTOMY WITH INTRAOPERATIVE CHOLANGIOGRAM;  Surgeon: Autumn Messing III, MD;  Location: [facility];  Service: General;  Laterality: N/A;   LEEP     LYMPH GLAND EXCISION      Family History  Problem Relation Age of Onset   Hypertension Maternal Grandmother    Cancer Maternal Grandmother        breast   Hypertension Maternal Grandfather    Heart attack Maternal Grandfather    Kidney disease Paternal Grandmother    Heart disease Paternal Grandfather    Hypertension Mother     Cancer Mother        rectal   Hearing loss Neg Hx     Social History   Socioeconomic History   Marital status: Significant Other    Spouse name: Not on file   Number of children: Not on file   Years of education: Not on file   Highest education level: Some college, no degree  Occupational History   Not on file  Tobacco Use   Smoking status: Every Day    Packs/day: 0.25    Types: Cigarettes   Smokeless tobacco: Never  Vaping Use   Vaping Use: Never used  Substance and Sexual Activity   Alcohol use: Yes    Alcohol/week: 2.0 - 3.0 standard drinks of alcohol    Types: 2 - 3 Glasses of wine per week    Comment: occas   Drug use: No   Sexual activity: Yes    Birth control/protection: Implant  Other Topics Concern   Not on file  Social History Narrative   ** Merged History Encounter **       Social Determinants of Health   Financial Resource Strain: Not on file  Food Insecurity: Not on file  Transportation Needs: No Transportation Needs (11/16/2019)   PRAPARE - Hydrologist (Medical): No    Lack of Transportation (Non-Medical): No  Physical Activity: Not on file  Stress: Stress Concern Present (11/19/2021)   [place]  [employer] - Occupational Stress Questionnaire    Feeling of Stress : Very much  Social Connections: Not on file    Allergies  Allergen Reactions   Sulfa Antibiotics Anaphylaxis, Swelling and Other (See Comments)   Other Other (See Comments)   Shellfish Allergy Swelling    Facial swelling    Shrimp (Diagnostic) Anxiety, Hives, Itching and Swelling    Outpatient Medications Prior to Visit  Medication Sig Dispense Refill   Blood Pressure Monitoring (BLOOD PRESSURE CUFF) MISC Use to check blood pressure once daily. 1 each 0   buPROPion (WELLBUTRIN XL) 300 MG 24 hr tablet Take 1 tablet (300 mg total) by mouth every morning. 30 tablet 0   EPINEPHrine (EPIPEN 2-PAK) 0.3 mg/0.3 mL IJ SOAJ injection Inject  0.3 mLs (0.3 mg total) into the muscle as needed for anaphylaxis. 1 each 1   fluticasone (FLONASE) 50 MCG/ACT nasal spray Place 2 sprays into both nostrils daily. 16 g 1   hydrOXYzine (ATARAX) 10 MG tablet Take 1 tablet (10 mg total) by mouth 3 (three) times daily as needed. 90 tablet 2   ipratropium-albuterol (DUONEB) 0.5-2.5 (3) MG/3ML SOLN Take 3 mLs by nebulization every 6 (six) hours as needed. 120 mL 0   loratadine (CLARITIN) 10 MG tablet Take 1 tablet (10 mg total) by mouth daily. 30 tablet 1   methocarbamol (ROBAXIN) 500 MG tablet Take 1 tablet (500 mg total) by mouth every 8 (eight) hours as needed for muscle spasms. 90 tablet 1   mupirocin ointment (BACTROBAN) 2 % Apply 1 application topically daily. 22 g 0   naproxen (NAPROSYN) 500 MG tablet Take 1 tablet (500 mg total) by mouth 2 (two) times daily with a meal. 60 tablet 6   ondansetron (ZOFRAN-ODT) 4 MG disintegrating tablet Take 1 tablet (4 mg total) by mouth every 8 (eight) hours as needed for nausea or vomiting. 20 tablet 0   sertraline (ZOLOFT) 100 MG tablet Take 1 tablet by mouth once daily 30 tablet 2   traZODone (DESYREL) 50 MG tablet Take 1 tablet (50 mg total) by mouth at bedtime as needed for sleep. 30 tablet 6   Vitamin D, Ergocalciferol, (DRISDOL) 1.25 MG (50000 UNIT) CAPS capsule Take 1 capsule (50,000 Units total) by mouth every 7 (seven) days. 12 capsule 1   albuterol (VENTOLIN HFA) 108 (90 Base) MCG/ACT inhaler Inhale 1-2 puffs into the lungs every 6 (six) hours as needed for wheezing or shortness of breath. 18 g 0   amLODipine (NORVASC) 10 MG tablet Take 1 tablet (10 mg total) by mouth daily. 90 tablet 1   carvedilol (COREG) 25 MG tablet Take 1 tablet (25 mg total) by mouth 2 (two) times daily with a meal. 60 tablet 6   metroNIDAZOLE (METROGEL VAGINAL) 0.75 % vaginal gel Insert twice a week for 6 months bacterial vaginosis prophylaxis 70 g 6   doxycycline (VIBRA-TABS) 100 MG tablet Take 1 tablet (100 mg total) by mouth 2  (two) times daily. (Patient not taking: Reported on 07/15/2022) 14 tablet 0   ipratropium-albuterol (DUONEB) 0.5-2.5 (3) MG/3ML SOLN USE 1 AMPULE IN NEBULIZER VIA NEBULIZATION EVERY 6 HOURS AS NEEDED 360 mL 0   clindamycin (CLEOCIN T) 1 % external solution Apply topically 2 (two) times daily. For hidradenitis (Patient not taking: Reported on 07/15/2022) 60 mL 1   No facility-administered medications prior to visit.     ROS Review of Systems  Constitutional:  Negative for activity change and appetite change.  HENT:  Negative for sinus pressure and sore throat.   Respiratory:  Negative for chest tightness, shortness of breath and wheezing.   Cardiovascular:  Negative for chest pain and palpitations.  Gastrointestinal:  Negative for abdominal distention, abdominal pain and constipation.  Genitourinary: Negative.   Musculoskeletal: Negative.   Psychiatric/Behavioral:  Positive for sleep disturbance. Negative for behavioral problems and dysphoric mood.     Objective:  BP 129/84   Pulse 78   Temp 98.3 F (36.8 C) (Oral)   Ht '5\' 2"'$  (1.575 m)   Wt 196 lb 3.2 oz (89 kg)   SpO2 97%   BMI 35.89 kg/m      07/15/2022    9:00 AM 01/18/2022    6:05 PM 01/08/2022    4:16 PM  BP/Weight  Systolic BP [id number]  Diastolic BP 84 [id number] 89  Wt. (Lbs) 196.2  191.2  BMI 35.89 kg/m2  34.97 kg/m2      Physical Exam Constitutional:      Appearance: She is well-developed.  Cardiovascular:     Rate and Rhythm: Normal rate.     Heart sounds: Normal heart sounds. No murmur heard. Pulmonary:     Effort: Pulmonary effort is normal.     Breath sounds: Normal breath sounds. No wheezing or rales.  Chest:     Chest wall: No tenderness.  Abdominal:     General: Bowel sounds are normal. There is no distension.     Palpations: Abdomen is soft. There is no mass.     Tenderness: There is no abdominal tenderness.  Musculoskeletal:        General: Normal range of motion.     Right lower leg: No edema.      Left lower leg: No edema.  Neurological:     Mental Status: She is alert and oriented to person, place, and time.  Psychiatric:        Mood and Affect: Mood normal.        Latest Ref Rng & Units 05/23/2021   10:33 AM 08/02/2020   10:01 AM 06/21/2019    4:59 PM  CMP  Glucose 70 - 99 mg/dL 88  104  76   BUN 6 - 20 mg/dL '9  8  7   '$ Creatinine 0.57 - 1.00 mg/dL 0.76  0.71  0.65   Sodium 134 - 144 mmol/L 140  140  135   Potassium 3.5 - 5.2 mmol/L 4.1  4.3  4.3   Chloride 96 - 106 mmol/L 104  104  102   CO2 20 - 29 mmol/L '20  19  20   '$ Calcium 8.7 - 10.2 mg/dL 9.3  9.6  9.5   Total Protein 6.0 - 8.5 g/dL 6.5   6.8   Total Bilirubin 0.0 - 1.2 mg/dL <0.2   0.2   Alkaline Phos 44 - 121 IU/L 109   104   AST 0 - 40 IU/L 12   9   ALT 0 - 32 IU/L 16   11     Lipid Panel     Component Value Date/Time   CHOL 183 05/23/2021 1033   TRIG 78 05/23/2021 1033   HDL 41 05/23/2021 1033   CHOLHDL 3.1 09/26/2017 0906   CHOLHDL 3.4 01/01/2016 1020   VLDL 10 01/01/2016 1020   LDLCALC 127 (H) 05/23/2021 1033    CBC    Component Value Date/Time   WBC 7.1 05/23/2021 1033   WBC 6.9 09/01/2017 1722   RBC 5.24 05/23/2021 1033  RBC 4.54 09/01/2017 1722   HGB 15.4 05/23/2021 1033   HCT 46.0 05/23/2021 1033   PLT 385 05/23/2021 1033   MCV 88 05/23/2021 1033   MCV 88 03/22/2013 0320   MCH 29.4 05/23/2021 1033   MCH 29.1 09/01/2017 1722   MCHC 33.5 05/23/2021 1033   MCHC 33.6 09/01/2017 1722   RDW 13.2 05/23/2021 1033   RDW 12.8 03/22/2013 0320   LYMPHSABS 2.5 05/23/2021 1033   EOSABS 0.3 05/23/2021 1033   BASOSABS 0.0 05/23/2021 1033    Lab Results  Component Value Date   HGBA1C 5.5 01/08/2022    Assessment & Plan:  1. Mild intermittent asthma without complication Controlled - albuterol (VENTOLIN HFA) 108 (90 Base) MCG/ACT inhaler; Inhale 1-2 puffs into the lungs every 6 (six) hours as needed for wheezing or shortness of breath.  Dispense: 18 g; Refill: 6  2. Other tobacco product  nicotine dependence, uncomplicated Spent 3 minutes counseling on smoking cessation but she is not ready to quit She is currently on Wellbutrin  3. Essential hypertension Controlled Counseled on blood pressure goal of less than 130/80, low-sodium, DASH diet, medication compliance, 150 minutes of moderate intensity exercise per week. Discussed medication compliance, adverse effects. - amLODipine (NORVASC) 10 MG tablet; Take 1 tablet (10 mg total) by mouth daily.  Dispense: 90 tablet; Refill: 1 - carvedilol (COREG) 25 MG tablet; Take 1 tablet (25 mg total) by mouth 2 (two) times daily with a meal.  Dispense: 180 tablet; Refill: 1 - LP+Non-HDL Cholesterol  4. Hidradenitis Stable - clindamycin (CLEOCIN T) 1 % external solution; Apply topically 2 (two) times daily. For hidradenitis  Dispense: 60 mL; Refill: 1 - Ambulatory referral to Dermatology  5. Bacterial vaginosis Controlled - metroNIDAZOLE (METROGEL VAGINAL) 0.75 % vaginal gel; Insert twice a week for 6 months bacterial vaginosis prophylaxis  Dispense: 70 g; Refill: 6  6. Need for hepatitis C screening test - HCV Ab w Reflex to Quant PCR  7. Suspected sleep apnea Will need to be evaluated for sleep apnea Strattera can also cause daytime somnolence as well as insomnia - Home sleep test; Future   Meds ordered this encounter  Medications   albuterol (VENTOLIN HFA) 108 (90 Base) MCG/ACT inhaler    Sig: Inhale 1-2 puffs into the lungs every 6 (six) hours as needed for wheezing or shortness of breath.    Dispense:  18 g    Refill:  6   amLODipine (NORVASC) 10 MG tablet    Sig: Take 1 tablet (10 mg total) by mouth daily.    Dispense:  90 tablet    Refill:  1   carvedilol (COREG) 25 MG tablet    Sig: Take 1 tablet (25 mg total) by mouth 2 (two) times daily with a meal.    Dispense:  180 tablet    Refill:  1   clindamycin (CLEOCIN T) 1 % external solution    Sig: Apply topically 2 (two) times daily. For hidradenitis    Dispense:   60 mL    Refill:  1   metroNIDAZOLE (METROGEL VAGINAL) 0.75 % vaginal gel    Sig: Insert twice a week for 6 months bacterial vaginosis prophylaxis    Dispense:  70 g    Refill:  6    Follow-up: Return in about 6 months (around 01/15/2023) for Chronic medical conditions.       Charlott Rakes, MD, FAAFP. [facility], [place]   07/15/2022, 9:54 AM

## 2022-07-16 LAB — LP+NON-HDL CHOLESTEROL
Cholesterol, Total: 157 mg/dL (ref 100–199)
HDL: 52 mg/dL (ref >39)
LDL Chol Calc (NIH): 85 mg/dL (ref 0–99)
Total Non-HDL-Chol (LDL+VLDL): 105 mg/dL (ref 0–129)
Triglycerides: 108 mg/dL (ref 0–149)
VLDL Cholesterol Cal: 20 mg/dL (ref 5–40)

## 2022-07-16 LAB — HCV INTERPRETATION

## 2022-07-16 LAB — HCV AB W REFLEX TO QUANT PCR: HCV Ab: NONREACTIVE

## 2022-07-18 DIAGNOSIS — F411 Generalized anxiety disorder: Secondary | ICD-10-CM | POA: Diagnosis not present

## 2022-08-02 DIAGNOSIS — Z3202 Encounter for pregnancy test, result negative: Secondary | ICD-10-CM | POA: Diagnosis not present

## 2022-08-02 DIAGNOSIS — R300 Dysuria: Secondary | ICD-10-CM | POA: Diagnosis not present

## 2022-08-07 DIAGNOSIS — F411 Generalized anxiety disorder: Secondary | ICD-10-CM | POA: Diagnosis not present

## 2022-09-17 ENCOUNTER — Encounter: Payer: Self-pay | Admitting: Family Medicine

## 2022-09-17 ENCOUNTER — Telehealth: Payer: Self-pay

## 2022-09-17 NOTE — Telephone Encounter (Signed)
Copied from CRM [id number]. Topic: General - Inquiry >> Sep 17, 2022  4:18 PM Marlow Baars wrote: Reason for CRM: The patient called in stating at her last visit with her provider she was told she would be getting an at home sleep test for sleep apnea. She has not heard anything from anybody regarding this and she really needs to get this done as she is always tired. She states she can even go somewhere and get it done if it is on a Friday. Please assist patient further.

## 2022-09-18 ENCOUNTER — Other Ambulatory Visit: Payer: Self-pay | Admitting: Family Medicine

## 2022-09-18 DIAGNOSIS — F411 Generalized anxiety disorder: Secondary | ICD-10-CM | POA: Diagnosis not present

## 2022-09-18 DIAGNOSIS — F32A Depression, unspecified: Secondary | ICD-10-CM

## 2022-09-18 MED ORDER — HYDROXYZINE HCL 25 MG PO TABS: 25.0000 mg | ORAL_TABLET | Freq: Three times a day (TID) | ORAL | 0 refills | Status: AC | PRN

## 2022-09-18 NOTE — Telephone Encounter (Signed)
Pt has been left a Vm and mychart message.

## 2022-09-19 ENCOUNTER — Telehealth: Payer: BC Managed Care – PPO | Admitting: Physician Assistant

## 2022-09-19 DIAGNOSIS — B3731 Acute candidiasis of vulva and vagina: Secondary | ICD-10-CM

## 2022-09-19 MED ORDER — FLUCONAZOLE 150 MG PO TABS: ORAL_TABLET | ORAL | 0 refills | Status: DC

## 2022-09-19 NOTE — Patient Instructions (Signed)
[employer], thank you for joining [name], PA-C for today's virtual visit.  While this provider is not your primary care provider (PCP), if your PCP is located in our provider database this encounter information will be shared with them immediately following your visit.   A [facility] MyChart account gives you access to today's visit and all your visits, tests, and labs performed at [facility] " click here if you don't have a  MyChart account or go to [email or web address]  Consent: (Patient) Kelli Wells provided verbal consent for this virtual visit at the beginning of the encounter.  Current Medications:  Current Outpatient Medications:    albuterol (VENTOLIN HFA) 108 (90 Base) MCG/ACT inhaler, Inhale 1-2 puffs into the lungs every 6 (six) hours as needed for wheezing or shortness of breath., Disp: 18 g, Rfl: 6   amLODipine (NORVASC) 10 MG tablet, Take 1 tablet (10 mg total) by mouth daily., Disp: 90 tablet, Rfl: 1   Blood Pressure Monitoring (BLOOD PRESSURE CUFF) MISC, Use to check blood pressure once daily., Disp: 1 each, Rfl: 0   buPROPion (WELLBUTRIN XL) 300 MG 24 hr tablet, Take 1 tablet (300 mg total) by mouth every morning., Disp: 30 tablet, Rfl: 0   carvedilol (COREG) 25 MG tablet, Take 1 tablet (25 mg total) by mouth 2 (two) times daily with a meal., Disp: 180 tablet, Rfl: 1   clindamycin (CLEOCIN T) 1 % external solution, Apply topically 2 (two) times daily. For hidradenitis, Disp: 60 mL, Rfl: 1   doxycycline (VIBRA-TABS) 100 MG tablet, Take 1 tablet (100 mg total) by mouth 2 (two) times daily. (Patient not taking: Reported on 07/15/2022), Disp: 14 tablet, Rfl: 0   EPINEPHrine (EPIPEN 2-PAK) 0.3 mg/0.3 mL IJ SOAJ injection, Inject 0.3 mLs (0.3 mg total) into the muscle as needed for anaphylaxis., Disp: 1 each, Rfl: 1   fluticasone (FLONASE) 50 MCG/ACT nasal spray, Place 2 sprays into both nostrils daily., Disp: 16 g, Rfl:  1   hydrOXYzine (ATARAX) 25 MG tablet, Take 1 tablet (25 mg total) by mouth 3 (three) times daily as needed., Disp: 90 tablet, Rfl: 0   ipratropium-albuterol (DUONEB) 0.5-2.5 (3) MG/3ML SOLN, Take 3 mLs by nebulization every 6 (six) hours as needed., Disp: 120 mL, Rfl: 0   ipratropium-albuterol (DUONEB) 0.5-2.5 (3) MG/3ML SOLN, USE 1 AMPULE IN NEBULIZER VIA NEBULIZATION EVERY 6 HOURS AS NEEDED, Disp: 360 mL, Rfl: 0   loratadine (CLARITIN) 10 MG tablet, Take 1 tablet (10 mg total) by mouth daily., Disp: 30 tablet, Rfl: 1   methocarbamol (ROBAXIN) 500 MG tablet, Take 1 tablet (500 mg total) by mouth every 8 (eight) hours as needed for muscle spasms., Disp: 90 tablet, Rfl: 1   metroNIDAZOLE (METROGEL VAGINAL) 0.75 % vaginal gel, Insert twice a week for 6 months bacterial vaginosis prophylaxis, Disp: 70 g, Rfl: 6   mupirocin ointment (BACTROBAN) 2 %, Apply 1 application topically daily., Disp: 22 g, Rfl: 0   naproxen (NAPROSYN) 500 MG tablet, Take 1 tablet (500 mg total) by mouth 2 (two) times daily with a meal., Disp: 60 tablet, Rfl: 6   ondansetron (ZOFRAN-ODT) 4 MG disintegrating tablet, Take 1 tablet (4 mg total) by mouth every 8 (eight) hours as needed for nausea or vomiting., Disp: 20 tablet, Rfl: 0   sertraline (ZOLOFT) 100 MG tablet, Take 1 tablet by mouth once daily, Disp: 30 tablet, Rfl: 2   traZODone (DESYREL) 50 MG tablet, Take 1 tablet (50 mg total) by  mouth at bedtime as needed for sleep., Disp: 30 tablet, Rfl: 6   Vitamin D, Ergocalciferol, (DRISDOL) 1.25 MG (50000 UNIT) CAPS capsule, Take 1 capsule (50,000 Units total) by mouth every 7 (seven) days., Disp: 12 capsule, Rfl: 1   Medications ordered in this encounter:  No orders of the defined types were placed in this encounter.    *If you need refills on other medications prior to your next appointment, please contact your pharmacy*  Follow-Up: Call back or seek an in-person evaluation if the symptoms worsen or if the condition fails  to improve as anticipated.  [facility] [phone]  Other Instructions Vaginal Yeast Infection, Adult  Vaginal yeast infection is a condition that causes vaginal discharge as well as soreness, swelling, and redness (inflammation) of the vagina. This is a common condition. Some women get this infection frequently. What are the causes? This condition is caused by a change in the normal balance of the yeast (Candida) and normal bacteria that live in the vagina. This change causes an overgrowth of yeast, which causes the inflammation. What increases the risk? The condition is more likely to develop in women who: Take antibiotic medicines. Have diabetes. Take birth control pills. Are pregnant. Douche often. Have a weak body defense system (immune system). Have been taking steroid medicines for a long time. Frequently wear tight clothing. What are the signs or symptoms? Symptoms of this condition include: White, thick, creamy vaginal discharge. Swelling, itching, redness, and irritation of the vagina. The lips of the vagina (labia) may be affected as well. Pain or a burning feeling while urinating. Pain during sex. How is this diagnosed? This condition is diagnosed based on: Your medical history. A physical exam. A pelvic exam. Your health care provider will examine a sample of your vaginal discharge under a microscope. Your health care provider may send this sample for testing to confirm the diagnosis. How is this treated? This condition is treated with medicine. Medicines may be over-the-counter or prescription. You may be told to use one or more of the following: Medicine that is taken by mouth (orally). Medicine that is applied as a cream (topically). Medicine that is inserted directly into the vagina (suppository). Follow these instructions at home: Take or apply over-the-counter and prescription medicines only as told by your health care provider. Do not use  tampons until your health care provider approves. Do not have sex until your infection has cleared. Sex can prolong or worsen your symptoms of infection. Ask your health care provider when it is safe to resume sexual activity. Keep all follow-up visits. This is important. How is this prevented?  Do not wear tight clothes, such as pantyhose or tight pants. Wear breathable cotton underwear. Do not use douches, perfumed soap, creams, or powders. Wipe from front to back after using the toilet. If you have diabetes, keep your blood sugar levels under control. Ask your health care provider for other ways to prevent yeast infections. Contact a health care provider if: You have a fever. Your symptoms go away and then return. Your symptoms do not get better with treatment. Your symptoms get worse. You have new symptoms. You develop blisters in or around your vagina. You have blood coming from your vagina and it is not your menstrual period. You develop pain in your abdomen. Summary Vaginal yeast infection is a condition that causes discharge as well as soreness, swelling, and redness (inflammation) of the vagina. This condition is treated with medicine. Medicines may  be over-the-counter or prescription. Take or apply over-the-counter and prescription medicines only as told by your health care provider. Do not douche. Resume sexual activity or use of tampons as instructed by your health care provider. Contact a health care provider if your symptoms do not get better with treatment or your symptoms go away and then return. This information is not intended to replace advice given to you by your health care provider. Make sure you discuss any questions you have with your health care provider. Document Revised: 07/17/2020 Document Reviewed: 07/17/2020 Elsevier Patient Education  2023 Elsevier Inc.    If you have been instructed to have an in-person evaluation today at a local Urgent Care facility,  please use the link below. It will take you to a list of all of our available [place] [facility], including address, phone number and hours of operation. Please do not delay care.  [facility]  If you or a family member do not have a primary care provider, use the link below to schedule a visit and establish care. When you choose a [facility] [place] primary care physician or advanced practice provider, you gain a long-term partner in health. Find a Primary Care Provider  Learn more about [facility]'s in-office and virtual care options: [facility] - Get Care Now

## 2022-09-19 NOTE — Progress Notes (Signed)
Virtual Visit Consent   Kelli Wells, you are scheduled for a virtual visit with a  provider today. Just as with appointments in the office, your consent must be obtained to participate. Your consent will be active for this visit and any virtual visit you may have with one of our providers in the next 365 days. If you have a MyChart account, a copy of this consent can be sent to you electronically.  As this is a virtual visit, video technology does not allow for your provider to perform a traditional examination. This may limit your provider's ability to fully assess your condition. If your provider identifies any concerns that need to be evaluated in person or the need to arrange testing (such as labs, EKG, etc.), we will make arrangements to do so. Although advances in technology are sophisticated, we cannot ensure that it will always work on either your end or our end. If the connection with a video visit is poor, the visit may have to be switched to a telephone visit. With either a video or telephone visit, we are not always able to ensure that we have a secure connection.  By engaging in this virtual visit, you consent to the provision of healthcare and authorize for your insurance to be billed (if applicable) for the services provided during this visit. Depending on your insurance coverage, you may receive a charge related to this service.  I need to obtain your verbal consent now. Are you willing to proceed with your visit today? Breanne Lafayette Dragon Rosell has provided verbal consent on 09/19/2022 for a virtual visit (video or telephone). Kelli Wells, [place]  Date: 09/19/2022 7:27 PM  Virtual Visit via Video Note   I, Kelli Wells, connected with  Kelli Wells  ([id number], 30-Dec-1985) on 09/19/22 at  7:45 PM EDT by a video-enabled telemedicine application and verified that I am speaking with the correct person using two identifiers.  Location: Patient:  Virtual Visit Location Patient: Home Provider: Virtual Visit Location Provider: Home Office   I discussed the limitations of evaluation and management by telemedicine and the availability of in person appointments. The patient expressed understanding and agreed to proceed.    History of Present Illness: Kelli Wells is a 37 y.o. who identifies as a female who was assigned female at birth, and is being seen today for  possible yeast infection. Endorses new soap use right before onset of symptoms. Noting Tuesday with vaginal itching and discomfort. Denies fever, chills. Denies hematuria and chills. Nexplanon in place.   HPI: HPI  Problems:  Patient Active Problem List   Diagnosis Date Noted   Mild intermittent asthma 01/08/2022   Chronic allergic conjunctivitis 01/08/2022   Allergic rhinitis due to pollen 01/08/2022   Allergic rhinitis due to animal (cat) (dog) hair and dander 01/08/2022   Allergic rhinitis 01/08/2022   Adverse reaction to food 01/08/2022   Carrier of group B Streptococcus 01/08/2022   Vitamin D deficiency 12/21/2021   Stress 01/10/2021   Psychophysiological insomnia 01/10/2021   Abnormal cervical Papanicolaou smear 01/09/2021   Asthma    Cholecystitis with cholelithiasis 12/17/2014   Non-reactive NST (non-stress test)    Postpartum depression 02/02/2013   Esophageal reflux 02/02/2013   Hypertensive disorder 05/02/2011   Anxiety state 05/02/2011   Attention deficit hyperactivity disorder (ADHD) 05/02/2011    Allergies:  Allergies  Allergen Reactions   Sulfa Antibiotics Anaphylaxis, Swelling and Other (See Comments)   Other Other (See Comments)  Shellfish Allergy Swelling    Facial swelling    Shrimp (Diagnostic) Anxiety, Hives, Itching and Swelling   Medications:  Current Outpatient Medications:    cloNIDine HCl (KAPVAY) 0.1 MG TB12 ER tablet, Take 0.1 mg by mouth daily., Disp: , Rfl:    fluconazole (DIFLUCAN) 150 MG tablet, Take 1 tablet PO once.  Repeat in 3 days if needed., Disp: 2 tablet, Rfl: 0   albuterol (VENTOLIN HFA) 108 (90 Base) MCG/ACT inhaler, Inhale 1-2 puffs into the lungs every 6 (six) hours as needed for wheezing or shortness of breath., Disp: 18 g, Rfl: 6   amLODipine (NORVASC) 10 MG tablet, Take 1 tablet (10 mg total) by mouth daily., Disp: 90 tablet, Rfl: 1   Blood Pressure Monitoring (BLOOD PRESSURE CUFF) MISC, Use to check blood pressure once daily., Disp: 1 each, Rfl: 0   buPROPion (WELLBUTRIN XL) 300 MG 24 hr tablet, Take 1 tablet (300 mg total) by mouth every morning., Disp: 30 tablet, Rfl: 0   carvedilol (COREG) 25 MG tablet, Take 1 tablet (25 mg total) by mouth 2 (two) times daily with a meal., Disp: 180 tablet, Rfl: 1   clindamycin (CLEOCIN T) 1 % external solution, Apply topically 2 (two) times daily. For hidradenitis, Disp: 60 mL, Rfl: 1   EPINEPHrine (EPIPEN 2-PAK) 0.3 mg/0.3 mL IJ SOAJ injection, Inject 0.3 mLs (0.3 mg total) into the muscle as needed for anaphylaxis., Disp: 1 each, Rfl: 1   fluticasone (FLONASE) 50 MCG/ACT nasal spray, Place 2 sprays into both nostrils daily., Disp: 16 g, Rfl: 1   hydrOXYzine (ATARAX) 25 MG tablet, Take 1 tablet (25 mg total) by mouth 3 (three) times daily as needed., Disp: 90 tablet, Rfl: 0   ipratropium-albuterol (DUONEB) 0.5-2.5 (3) MG/3ML SOLN, Take 3 mLs by nebulization every 6 (six) hours as needed., Disp: 120 mL, Rfl: 0   ipratropium-albuterol (DUONEB) 0.5-2.5 (3) MG/3ML SOLN, USE 1 AMPULE IN NEBULIZER VIA NEBULIZATION EVERY 6 HOURS AS NEEDED, Disp: 360 mL, Rfl: 0   loratadine (CLARITIN) 10 MG tablet, Take 1 tablet (10 mg total) by mouth daily., Disp: 30 tablet, Rfl: 1   methocarbamol (ROBAXIN) 500 MG tablet, Take 1 tablet (500 mg total) by mouth every 8 (eight) hours as needed for muscle spasms., Disp: 90 tablet, Rfl: 1   metroNIDAZOLE (METROGEL VAGINAL) 0.75 % vaginal gel, Insert twice a week for 6 months bacterial vaginosis prophylaxis, Disp: 70 g, Rfl: 6   mupirocin  ointment (BACTROBAN) 2 %, Apply 1 application topically daily., Disp: 22 g, Rfl: 0   naproxen (NAPROSYN) 500 MG tablet, Take 1 tablet (500 mg total) by mouth 2 (two) times daily with a meal., Disp: 60 tablet, Rfl: 6   ondansetron (ZOFRAN-ODT) 4 MG disintegrating tablet, Take 1 tablet (4 mg total) by mouth every 8 (eight) hours as needed for nausea or vomiting., Disp: 20 tablet, Rfl: 0   sertraline (ZOLOFT) 100 MG tablet, Take 1 tablet by mouth once daily, Disp: 30 tablet, Rfl: 2   traZODone (DESYREL) 50 MG tablet, Take 1 tablet (50 mg total) by mouth at bedtime as needed for sleep., Disp: 30 tablet, Rfl: 6   Vitamin D, Ergocalciferol, (DRISDOL) 1.25 MG (50000 UNIT) CAPS capsule, Take 1 capsule (50,000 Units total) by mouth every 7 (seven) days., Disp: 12 capsule, Rfl: 1  Observations/Objective: Patient is well-developed, well-nourished in no acute distress.  Resting comfortably at home.  Head is normocephalic, atraumatic.  No labored breathing. Speech is clear and coherent with logical content.  Patient is alert and oriented at baseline.   Assessment and Plan: 1. Yeast vaginitis - fluconazole (DIFLUCAN) 150 MG tablet; Take 1 tablet PO once. Repeat in 3 days if needed.  Dispense: 2 tablet; Refill: 0  Classic symptoms and prior history.  No alarm signs or symptoms present.  Supportive measures reviewed with patient.  Diflucan per orders.  Follow-up in person if not resolving.  Follow Up Instructions: I discussed the assessment and treatment plan with the patient. The patient was provided an opportunity to ask questions and all were answered. The patient agreed with the plan and demonstrated an understanding of the instructions.  A copy of instructions were sent to the patient via MyChart unless otherwise noted below.   The patient was advised to call back or seek an in-person evaluation if the symptoms worsen or if the condition fails to improve as anticipated.  Time:  I spent 10 minutes  with the patient via telehealth technology discussing the above problems/concerns.    Kelli Climes, PA-C

## 2022-09-24 ENCOUNTER — Telehealth: Payer: Self-pay | Admitting: Family Medicine

## 2022-09-24 NOTE — Telephone Encounter (Signed)
Pt has been informed to drop off or fax paperwork to office and a virtual visit will be scheduled to complete the paperwork.

## 2022-09-24 NOTE — Telephone Encounter (Signed)
Copied from CRM [id number]. Topic: General - Other >> Sep 24, 2022 12:57 PM Santiya F wrote: Reason for CRM: Pt is calling in because she is wondering if she could get FMLA papers for depression for her job. Pt is requesting FMLA and needs the paperwork. Please follow up with pt.

## 2022-10-14 ENCOUNTER — Ambulatory Visit (HOSPITAL_BASED_OUTPATIENT_CLINIC_OR_DEPARTMENT_OTHER): Payer: BC Managed Care – PPO | Admitting: Internal Medicine

## 2022-11-04 ENCOUNTER — Encounter (HOSPITAL_BASED_OUTPATIENT_CLINIC_OR_DEPARTMENT_OTHER): Payer: Self-pay | Admitting: Internal Medicine

## 2022-11-04 ENCOUNTER — Ambulatory Visit (HOSPITAL_BASED_OUTPATIENT_CLINIC_OR_DEPARTMENT_OTHER): Payer: BC Managed Care – PPO | Attending: Family Medicine | Admitting: Internal Medicine

## 2022-11-04 DIAGNOSIS — R29818 Other symptoms and signs involving the nervous system: Secondary | ICD-10-CM

## 2022-11-05 DIAGNOSIS — L732 Hidradenitis suppurativa: Secondary | ICD-10-CM | POA: Diagnosis not present

## 2022-11-05 DIAGNOSIS — Z3046 Encounter for surveillance of implantable subdermal contraceptive: Secondary | ICD-10-CM | POA: Diagnosis not present

## 2022-11-05 DIAGNOSIS — Z114 Encounter for screening for human immunodeficiency virus [HIV]: Secondary | ICD-10-CM | POA: Diagnosis not present

## 2022-11-25 ENCOUNTER — Ambulatory Visit (HOSPITAL_BASED_OUTPATIENT_CLINIC_OR_DEPARTMENT_OTHER): Payer: BC Managed Care – PPO | Admitting: Internal Medicine

## 2022-11-25 ENCOUNTER — Encounter (HOSPITAL_BASED_OUTPATIENT_CLINIC_OR_DEPARTMENT_OTHER): Payer: BC Managed Care – PPO | Admitting: Internal Medicine

## 2022-11-25 ENCOUNTER — Encounter (HOSPITAL_BASED_OUTPATIENT_CLINIC_OR_DEPARTMENT_OTHER): Payer: Medicaid Other | Admitting: Internal Medicine

## 2022-11-28 ENCOUNTER — Ambulatory Visit: Payer: BC Managed Care – PPO | Admitting: Dermatology

## 2022-11-29 ENCOUNTER — Encounter: Payer: Self-pay | Admitting: Family Medicine

## 2022-12-02 ENCOUNTER — Ambulatory Visit (HOSPITAL_BASED_OUTPATIENT_CLINIC_OR_DEPARTMENT_OTHER): Payer: BC Managed Care – PPO | Attending: Family Medicine | Admitting: Internal Medicine

## 2022-12-02 ENCOUNTER — Other Ambulatory Visit: Payer: Self-pay | Admitting: Family Medicine

## 2022-12-02 ENCOUNTER — Ambulatory Visit: Payer: Self-pay

## 2022-12-02 ENCOUNTER — Other Ambulatory Visit: Payer: Self-pay

## 2022-12-02 ENCOUNTER — Ambulatory Visit: Payer: BC Managed Care – PPO | Admitting: Dermatology

## 2022-12-02 VITALS — Ht 62.0 in | Wt 196.0 lb

## 2022-12-02 DIAGNOSIS — F32A Depression, unspecified: Secondary | ICD-10-CM

## 2022-12-02 MED ORDER — SERTRALINE HCL 100 MG PO TABS: 150.0000 mg | ORAL_TABLET | Freq: Every day | ORAL | 0 refills | Status: AC

## 2022-12-02 NOTE — Telephone Encounter (Signed)
Requested medications are due for refill today.  yes  Requested medications are on the active medications list.  yes  Last refill. 07/23/2021 #30 2 rf  Future visit scheduled.   yes  Notes to clinic.  Labs are expired. Please see notes exchanged between pt and provider regarding refilling this medication.     Requested Prescriptions  Pending Prescriptions Disp Refills   sertraline (ZOLOFT) 100 MG tablet 30 tablet 2    Sig: Take 1 tablet (100 mg total) by mouth daily.     Psychiatry:  Antidepressants - SSRI - sertraline Failed - 12/02/2022  5:55 PM      Failed - AST in normal range and within 360 days    AST  Date Value Ref Range Status  05/23/2021 12 0 - 40 IU/L Final   SGOT(AST)  Date Value Ref Range Status  03/22/2013 23 15 - 37 Unit/L Final         Failed - ALT in normal range and within 360 days    ALT  Date Value Ref Range Status  05/23/2021 16 0 - 32 IU/L Final   SGPT (ALT)  Date Value Ref Range Status  03/22/2013 21 12 - 78 U/L Final         Passed - Completed PHQ-2 or PHQ-9 in the last 360 days      Passed - Valid encounter within last 6 months    Recent Outpatient Visits           4 months ago Other tobacco product nicotine dependence, uncomplicated   [place] [facility] [place], Odette Horns, MD   10 months ago Prediabetes   [place] [facility] Hoy Register, MD   1 year ago Essential hypertension   [facility] [place], Cornelius Moras, RPH-CPP   1 year ago Anxiety and depression   [facility] Hoy Register, MD   1 year ago Abscess of right breast   [facility] Hoy Register, MD       Future Appointments             In 2 months Terri Piedra, DO [facility]   In 4 months Hoy Register, MD [facility]

## 2022-12-02 NOTE — Telephone Encounter (Signed)
  Chief Complaint: Needs medication Symptoms: "Twitchy" Frequency: Out of med for 5 days Pertinent Negatives: Patient denies  Disposition: [] ED /[] Urgent Care (no appt availability in office) / [] Appointment(In office/virtual)/ []  [facility]/ [] Home Care/ [] Refused Recommended Disposition /[] [facility]/ []  Follow-up with PCP Additional Notes: Pt has sent  note to Dr. Alvis Lemmings, but Dr. Alvis Lemmings needed additional information in order to fill medication. Pt responded to Dr. Baxter Flattery request. Pt states that they were increasing the dosage of zoloft to 150mg . Current rx is for 100mg . Please advise.    Reason for Disposition  [1] Prescription refill request for ESSENTIAL medicine (i.e., likelihood of harm to patient if not taken) AND [2] triager unable to refill per department policy  Answer Assessment - Initial Assessment Questions 1. DRUG NAME: "What medicine do you need to have refilled?"     Zoloft -  2. REFILLS REMAINING: "How many refills are remaining?" (Note: The label on the medicine or pill bottle will show how many refills are remaining. If there are no refills remaining, then a renewal may be needed.)      3. EXPIRATION DATE: "What is the expiration date?" (Note: The label states when the prescription will expire, and thus can no longer be refilled.)      4. PRESCRIBING HCP: "Who prescribed it?" Reason: If prescribed by specialist, call should be referred to that group.     Specialist 5. SYMPTOMS: "Do you have any symptoms?"     Twitching  Protocols used: Medication Refill and Renewal Call-A-AH

## 2022-12-03 NOTE — Telephone Encounter (Signed)
Addressed via MyChart

## 2022-12-04 DIAGNOSIS — F411 Generalized anxiety disorder: Secondary | ICD-10-CM | POA: Diagnosis not present

## 2022-12-05 ENCOUNTER — Other Ambulatory Visit: Payer: Self-pay | Admitting: Family Medicine

## 2022-12-05 DIAGNOSIS — B3731 Acute candidiasis of vulva and vagina: Secondary | ICD-10-CM

## 2022-12-05 MED ORDER — FLUCONAZOLE 150 MG PO TABS
ORAL_TABLET | ORAL | 0 refills | Status: DC
Start: 2022-12-05 — End: 2022-12-25

## 2022-12-17 ENCOUNTER — Encounter: Payer: Self-pay | Admitting: Family Medicine

## 2022-12-18 ENCOUNTER — Other Ambulatory Visit: Payer: Self-pay | Admitting: Family Medicine

## 2022-12-18 DIAGNOSIS — R29818 Other symptoms and signs involving the nervous system: Secondary | ICD-10-CM

## 2022-12-25 ENCOUNTER — Telehealth: Payer: BC Managed Care – PPO | Admitting: Physician Assistant

## 2022-12-25 DIAGNOSIS — T3695XA Adverse effect of unspecified systemic antibiotic, initial encounter: Secondary | ICD-10-CM

## 2022-12-25 DIAGNOSIS — B9689 Other specified bacterial agents as the cause of diseases classified elsewhere: Secondary | ICD-10-CM

## 2022-12-25 DIAGNOSIS — B379 Candidiasis, unspecified: Secondary | ICD-10-CM | POA: Diagnosis not present

## 2022-12-25 DIAGNOSIS — J019 Acute sinusitis, unspecified: Secondary | ICD-10-CM | POA: Diagnosis not present

## 2022-12-25 MED ORDER — FLUCONAZOLE 150 MG PO TABS
150.0000 mg | ORAL_TABLET | ORAL | 0 refills | Status: DC | PRN
Start: 2022-12-25 — End: 2023-06-05

## 2022-12-25 MED ORDER — PROMETHAZINE-DM 6.25-15 MG/5ML PO SYRP
5.0000 mL | ORAL_SOLUTION | Freq: Four times a day (QID) | ORAL | 0 refills | Status: DC | PRN
Start: 2022-12-25 — End: 2023-06-12

## 2022-12-25 MED ORDER — AMOXICILLIN-POT CLAVULANATE 875-125 MG PO TABS
1.0000 | ORAL_TABLET | Freq: Two times a day (BID) | ORAL | 0 refills | Status: DC
Start: 2022-12-25 — End: 2023-05-29

## 2022-12-25 NOTE — Patient Instructions (Signed)
[employer], thank you for joining Margaretann Loveless, PA-C for today's virtual visit.  While this provider is not your primary care provider (PCP), if your PCP is located in our provider database this encounter information will be shared with them immediately following your visit.   A [facility] MyChart account gives you access to today's visit and all your visits, tests, and labs performed at [facility] " click here if you don't have a [facility] MyChart account or go to [email or web address]  Consent: (Patient) Kelli Wells provided verbal consent for this virtual visit at the beginning of the encounter.  Current Medications:  Current Outpatient Medications:    amoxicillin-clavulanate (AUGMENTIN) 875-125 MG tablet, Take 1 tablet by mouth 2 (two) times daily., Disp: 20 tablet, Rfl: 0   fluconazole (DIFLUCAN) 150 MG tablet, Take 1 tablet (150 mg total) by mouth every 3 (three) days as needed., Disp: 3 tablet, Rfl: 0   promethazine-dextromethorphan (PROMETHAZINE-DM) 6.25-15 MG/5ML syrup, Take 5 mLs by mouth 4 (four) times daily as needed., Disp: 118 mL, Rfl: 0   albuterol (VENTOLIN HFA) 108 (90 Base) MCG/ACT inhaler, Inhale 1-2 puffs into the lungs every 6 (six) hours as needed for wheezing or shortness of breath., Disp: 18 g, Rfl: 6   amLODipine (NORVASC) 10 MG tablet, Take 1 tablet (10 mg total) by mouth daily., Disp: 90 tablet, Rfl: 1   Blood Pressure Monitoring (BLOOD PRESSURE CUFF) MISC, Use to check blood pressure once daily., Disp: 1 each, Rfl: 0   buPROPion (WELLBUTRIN XL) 300 MG 24 hr tablet, Take 1 tablet (300 mg total) by mouth every morning., Disp: 30 tablet, Rfl: 0   carvedilol (COREG) 25 MG tablet, Take 1 tablet (25 mg total) by mouth 2 (two) times daily with a meal., Disp: 180 tablet, Rfl: 1   clindamycin (CLEOCIN T) 1 % external solution, Apply topically 2 (two) times daily. For hidradenitis, Disp: 60 mL, Rfl: 1   cloNIDine HCl (KAPVAY)  0.1 MG TB12 ER tablet, Take 0.1 mg by mouth daily., Disp: , Rfl:    EPINEPHrine (EPIPEN 2-PAK) 0.3 mg/0.3 mL IJ SOAJ injection, Inject 0.3 mLs (0.3 mg total) into the muscle as needed for anaphylaxis., Disp: 1 each, Rfl: 1   fluticasone (FLONASE) 50 MCG/ACT nasal spray, Place 2 sprays into both nostrils daily., Disp: 16 g, Rfl: 1   hydrOXYzine (ATARAX) 25 MG tablet, Take 1 tablet (25 mg total) by mouth 3 (three) times daily as needed., Disp: 90 tablet, Rfl: 0   ipratropium-albuterol (DUONEB) 0.5-2.5 (3) MG/3ML SOLN, Take 3 mLs by nebulization every 6 (six) hours as needed., Disp: 120 mL, Rfl: 0   ipratropium-albuterol (DUONEB) 0.5-2.5 (3) MG/3ML SOLN, USE 1 AMPULE IN NEBULIZER VIA NEBULIZATION EVERY 6 HOURS AS NEEDED, Disp: 360 mL, Rfl: 0   loratadine (CLARITIN) 10 MG tablet, Take 1 tablet (10 mg total) by mouth daily., Disp: 30 tablet, Rfl: 1   methocarbamol (ROBAXIN) 500 MG tablet, Take 1 tablet (500 mg total) by mouth every 8 (eight) hours as needed for muscle spasms., Disp: 90 tablet, Rfl: 1   metroNIDAZOLE (METROGEL VAGINAL) 0.75 % vaginal gel, Insert twice a week for 6 months bacterial vaginosis prophylaxis, Disp: 70 g, Rfl: 6   mupirocin ointment (BACTROBAN) 2 %, Apply 1 application topically daily., Disp: 22 g, Rfl: 0   naproxen (NAPROSYN) 500 MG tablet, Take 1 tablet (500 mg total) by mouth 2 (two) times daily with a meal., Disp: 60 tablet, Rfl: 6   ondansetron (ZOFRAN-ODT)  4 MG disintegrating tablet, Take 1 tablet (4 mg total) by mouth every 8 (eight) hours as needed for nausea or vomiting., Disp: 20 tablet, Rfl: 0   sertraline (ZOLOFT) 100 MG tablet, Take 1.5 tablets (150 mg total) by mouth daily., Disp: 45 tablet, Rfl: 0   traZODone (DESYREL) 50 MG tablet, Take 1 tablet (50 mg total) by mouth at bedtime as needed for sleep., Disp: 30 tablet, Rfl: 6   Vitamin D, Ergocalciferol, (DRISDOL) 1.25 MG (50000 UNIT) CAPS capsule, Take 1 capsule (50,000 Units total) by mouth every 7 (seven) days.,  Disp: 12 capsule, Rfl: 1   Medications ordered in this encounter:  Meds ordered this encounter  Medications   amoxicillin-clavulanate (AUGMENTIN) 875-125 MG tablet    Sig: Take 1 tablet by mouth 2 (two) times daily.    Dispense:  20 tablet    Refill:  0    Order Specific Question:   Supervising Provider    Answer:   Merrilee Jansky [id number]   promethazine-dextromethorphan (PROMETHAZINE-DM) 6.25-15 MG/5ML syrup    Sig: Take 5 mLs by mouth 4 (four) times daily as needed.    Dispense:  118 mL    Refill:  0    Order Specific Question:   Supervising Provider    Answer:   Merrilee Jansky [[id number]]   fluconazole (DIFLUCAN) 150 MG tablet    Sig: Take 1 tablet (150 mg total) by mouth every 3 (three) days as needed.    Dispense:  3 tablet    Refill:  0    Order Specific Question:   Supervising Provider    Answer:   Merrilee Jansky [id number]     *If you need refills on other medications prior to your next appointment, please contact your pharmacy*  Follow-Up: Call back or seek an in-person evaluation if the symptoms worsen or if the condition fails to improve as anticipated.  [facility] [phone]  Other Instructions Sinus Infection, Adult A sinus infection, also called sinusitis, is inflammation of your sinuses. Sinuses are hollow spaces in the bones around your face. Your sinuses are located: Around your eyes. In the middle of your forehead. Behind your nose. In your cheekbones. Mucus normally drains out of your sinuses. When your nasal tissues become inflamed or swollen, mucus can become trapped or blocked. This allows bacteria, viruses, and fungi to grow, which leads to infection. Most infections of the sinuses are caused by a virus. A sinus infection can develop quickly. It can last for up to 4 weeks (acute) or for more than 12 weeks (chronic). A sinus infection often develops after a cold. What are the causes? This condition is caused by anything that  creates swelling in the sinuses or stops mucus from draining. This includes: Allergies. Asthma. Infection from bacteria or viruses. Deformities or blockages in your nose or sinuses. Abnormal growths in the nose (nasal polyps). Pollutants, such as chemicals or irritants in the air. Infection from fungi. This is rare. What increases the risk? You are more likely to develop this condition if you: Have a weak body defense system (immune system). Do a lot of swimming or diving. Overuse nasal sprays. Smoke. What are the signs or symptoms? The main symptoms of this condition are pain and a feeling of pressure around the affected sinuses. Other symptoms include: Stuffy nose or congestion that makes it difficult to breathe through your nose. Thick yellow or greenish drainage from your nose. Tenderness, swelling, and warmth over the affected  sinuses. A cough that may get worse at night. Decreased sense of smell and taste. Extra mucus that collects in the throat or the back of the nose (postnasal drip) causing a sore throat or bad breath. Tiredness (fatigue). Fever. How is this diagnosed? This condition is diagnosed based on: Your symptoms. Your medical history. A physical exam. Tests to find out if your condition is acute or chronic. This may include: Checking your nose for nasal polyps. Viewing your sinuses using a device that has a light (endoscope). Testing for allergies or bacteria. Imaging tests, such as an MRI or CT scan. In rare cases, a bone biopsy may be done to rule out more serious types of fungal sinus disease. How is this treated? Treatment for a sinus infection depends on the cause and whether your condition is chronic or acute. If caused by a virus, your symptoms should go away on their own within 10 days. You may be given medicines to relieve symptoms. They include: Medicines that shrink swollen nasal passages (decongestants). A spray that eases inflammation of the  nostrils (topical intranasal corticosteroids). Rinses that help get rid of thick mucus in your nose (nasal saline washes). Medicines that treat allergies (antihistamines). Over-the-counter pain relievers. If caused by bacteria, your health care provider may recommend waiting to see if your symptoms improve. Most bacterial infections will get better without antibiotic medicine. You may be given antibiotics if you have: A severe infection. A weak immune system. If caused by narrow nasal passages or nasal polyps, surgery may be needed. Follow these instructions at home: Medicines Take, use, or apply over-the-counter and prescription medicines only as told by your health care provider. These may include nasal sprays. If you were prescribed an antibiotic medicine, take it as told by your health care provider. Do not stop taking the antibiotic even if you start to feel better. Hydrate and humidify  Drink enough fluid to keep your urine pale yellow. Staying hydrated will help to thin your mucus. Use a cool mist humidifier to keep the humidity level in your home above 50%. Inhale steam for 10-15 minutes, 3-4 times a day, or as told by your health care provider. You can do this in the bathroom while a hot shower is running. Limit your exposure to cool or dry air. Rest Rest as much as possible. Sleep with your head raised (elevated). Make sure you get enough sleep each night. General instructions  Apply a warm, moist washcloth to your face 3-4 times a day or as told by your health care provider. This will help with discomfort. Use nasal saline washes as often as told by your health care provider. Wash your hands often with soap and water to reduce your exposure to germs. If soap and water are not available, use hand sanitizer. Do not smoke. Avoid being around people who are smoking (secondhand smoke). Keep all follow-up visits. This is important. Contact a health care provider if: You have a  fever. Your symptoms get worse. Your symptoms do not improve within 10 days. Get help right away if: You have a severe headache. You have persistent vomiting. You have severe pain or swelling around your face or eyes. You have vision problems. You develop confusion. Your neck is stiff. You have trouble breathing. These symptoms may be an emergency. Get help right away. Call 911. Do not wait to see if the symptoms will go away. Do not drive yourself to the hospital. Summary A sinus infection is soreness and inflammation of  your sinuses. Sinuses are hollow spaces in the bones around your face. This condition is caused by nasal tissues that become inflamed or swollen. The swelling traps or blocks the flow of mucus. This allows bacteria, viruses, and fungi to grow, which leads to infection. If you were prescribed an antibiotic medicine, take it as told by your health care provider. Do not stop taking the antibiotic even if you start to feel better. Keep all follow-up visits. This is important. This information is not intended to replace advice given to you by your health care provider. Make sure you discuss any questions you have with your health care provider. Document Revised: 04/03/2021 Document Reviewed: 04/03/2021 Elsevier Patient Education  2024 [employer]vier Inc.    If you have been instructed to have an in-person evaluation today at a local Urgent Care facility, please use the link below. It will take you to a list of all of our available [facility], including address, phone number and hours of operation. Please do not delay care.  [facility]  If you or a family member do not have a primary care provider, use the link below to schedule a visit and establish care. When you choose a [facility] primary care physician or advanced practice provider, you gain a long-term partner in health. Find a Primary Care Provider  Learn more about [facility]'s in-office and  virtual care options: [facility] - Get Care Now

## 2022-12-25 NOTE — Progress Notes (Signed)
Virtual Visit Consent   Kelli Wells, you are scheduled for a virtual visit with a [facility] provider today. Just as with appointments in the office, your consent must be obtained to participate. Your consent will be active for this visit and any virtual visit you may have with one of our providers in the next 365 days. If you have a MyChart account, a copy of this consent can be sent to you electronically.  As this is a virtual visit, video technology does not allow for your provider to perform a traditional examination. This may limit your provider's ability to fully assess your condition. If your provider identifies any concerns that need to be evaluated in person or the need to arrange testing (such as labs, EKG, etc.), we will make arrangements to do so. Although advances in technology are sophisticated, we cannot ensure that it will always work on either your end or our end. If the connection with a video visit is poor, the visit may have to be switched to a telephone visit. With either a video or telephone visit, we are not always able to ensure that we have a secure connection.  By engaging in this virtual visit, you consent to the provision of healthcare and authorize for your insurance to be billed (if applicable) for the services provided during this visit. Depending on your insurance coverage, you may receive a charge related to this service.  I need to obtain your verbal consent now. Are you willing to proceed with your visit today? Kelli Wells has provided verbal consent on 12/25/2022 for a virtual visit (video or telephone). Kelli Loveless, PA-C  Date: 12/25/2022 6:26 PM  Virtual Visit via Video Note   I, Kelli Wells, connected with  Kelli Wells  ([id number], 1986-01-22) on 12/25/22 at  6:15 PM EDT by a video-enabled telemedicine application and verified that I am speaking with the correct person using two identifiers.  Location: Patient:  Virtual Visit Location Patient: Home Provider: Virtual Visit Location Provider: Home Office   I discussed the limitations of evaluation and management by telemedicine and the availability of in person appointments. The patient expressed understanding and agreed to proceed.    History of Present Illness: Kelli Wells is a 37 y.o. who identifies as a female who was assigned female at birth, and is being seen today for possible sinus infection.  HPI: Sinusitis This is a new problem. The current episode started in the past 7 days. The problem has been gradually worsening since onset. There has been no fever. The pain is moderate. Associated symptoms include congestion, coughing (mild), headaches, shortness of breath (has had to increase use of albuterol) and sinus pressure. Pertinent negatives include no chills, diaphoresis, ear pain, hoarse voice, sneezing, sore throat or swollen glands. (Increased post nasal drainage) Treatments tried: daily allergy medications, dayquil. The treatment provided no relief.     Problems:  Patient Active Problem List   Diagnosis Date Noted   Mild intermittent asthma 01/08/2022   Chronic allergic conjunctivitis 01/08/2022   Allergic rhinitis due to pollen 01/08/2022   Allergic rhinitis due to animal (cat) (dog) hair and dander 01/08/2022   Allergic rhinitis 01/08/2022   Adverse reaction to food 01/08/2022   Carrier of group B Streptococcus 01/08/2022   Vitamin D deficiency 12/21/2021   Stress 01/10/2021   Psychophysiological insomnia 01/10/2021   Abnormal cervical Papanicolaou smear 01/09/2021   Asthma    Cholecystitis with cholelithiasis 12/17/2014   Non-reactive NST (  non-stress test)    Postpartum depression 02/02/2013   Esophageal reflux 02/02/2013   Hypertensive disorder 05/02/2011   Anxiety state 05/02/2011   Attention deficit hyperactivity disorder (ADHD) 05/02/2011    Allergies:  Allergies  Allergen Reactions   Other Other (See  Comments)   Shellfish Allergy Swelling    Facial swelling    Shrimp (Diagnostic) Anxiety, Hives, Itching and Swelling   Medications:  Current Outpatient Medications:    amoxicillin-clavulanate (AUGMENTIN) 875-125 MG tablet, Take 1 tablet by mouth 2 (two) times daily., Disp: 20 tablet, Rfl: 0   fluconazole (DIFLUCAN) 150 MG tablet, Take 1 tablet (150 mg total) by mouth every 3 (three) days as needed., Disp: 3 tablet, Rfl: 0   promethazine-dextromethorphan (PROMETHAZINE-DM) 6.25-15 MG/5ML syrup, Take 5 mLs by mouth 4 (four) times daily as needed., Disp: 118 mL, Rfl: 0   albuterol (VENTOLIN HFA) 108 (90 Base) MCG/ACT inhaler, Inhale 1-2 puffs into the lungs every 6 (six) hours as needed for wheezing or shortness of breath., Disp: 18 g, Rfl: 6   amLODipine (NORVASC) 10 MG tablet, Take 1 tablet (10 mg total) by mouth daily., Disp: 90 tablet, Rfl: 1   Blood Pressure Monitoring (BLOOD PRESSURE CUFF) MISC, Use to check blood pressure once daily., Disp: 1 each, Rfl: 0   buPROPion (WELLBUTRIN XL) 300 MG 24 hr tablet, Take 1 tablet (300 mg total) by mouth every morning., Disp: 30 tablet, Rfl: 0   carvedilol (COREG) 25 MG tablet, Take 1 tablet (25 mg total) by mouth 2 (two) times daily with a meal., Disp: 180 tablet, Rfl: 1   clindamycin (CLEOCIN T) 1 % external solution, Apply topically 2 (two) times daily. For hidradenitis, Disp: 60 mL, Rfl: 1   cloNIDine HCl (KAPVAY) 0.1 MG TB12 ER tablet, Take 0.1 mg by mouth daily., Disp: , Rfl:    EPINEPHrine (EPIPEN 2-PAK) 0.3 mg/0.3 mL IJ SOAJ injection, Inject 0.3 mLs (0.3 mg total) into the muscle as needed for anaphylaxis., Disp: 1 each, Rfl: 1   fluticasone (FLONASE) 50 MCG/ACT nasal spray, Place 2 sprays into both nostrils daily., Disp: 16 g, Rfl: 1   hydrOXYzine (ATARAX) 25 MG tablet, Take 1 tablet (25 mg total) by mouth 3 (three) times daily as needed., Disp: 90 tablet, Rfl: 0   ipratropium-albuterol (DUONEB) 0.5-2.5 (3) MG/3ML SOLN, Take 3 mLs by nebulization  every 6 (six) hours as needed., Disp: 120 mL, Rfl: 0   ipratropium-albuterol (DUONEB) 0.5-2.5 (3) MG/3ML SOLN, USE 1 AMPULE IN NEBULIZER VIA NEBULIZATION EVERY 6 HOURS AS NEEDED, Disp: 360 mL, Rfl: 0   loratadine (CLARITIN) 10 MG tablet, Take 1 tablet (10 mg total) by mouth daily., Disp: 30 tablet, Rfl: 1   methocarbamol (ROBAXIN) 500 MG tablet, Take 1 tablet (500 mg total) by mouth every 8 (eight) hours as needed for muscle spasms., Disp: 90 tablet, Rfl: 1   metroNIDAZOLE (METROGEL VAGINAL) 0.75 % vaginal gel, Insert twice a week for 6 months bacterial vaginosis prophylaxis, Disp: 70 g, Rfl: 6   mupirocin ointment (BACTROBAN) 2 %, Apply 1 application topically daily., Disp: 22 g, Rfl: 0   naproxen (NAPROSYN) 500 MG tablet, Take 1 tablet (500 mg total) by mouth 2 (two) times daily with a meal., Disp: 60 tablet, Rfl: 6   ondansetron (ZOFRAN-ODT) 4 MG disintegrating tablet, Take 1 tablet (4 mg total) by mouth every 8 (eight) hours as needed for nausea or vomiting., Disp: 20 tablet, Rfl: 0   sertraline (ZOLOFT) 100 MG tablet, Take 1.5 tablets (150 mg  total) by mouth daily., Disp: 45 tablet, Rfl: 0   traZODone (DESYREL) 50 MG tablet, Take 1 tablet (50 mg total) by mouth at bedtime as needed for sleep., Disp: 30 tablet, Rfl: 6   Vitamin D, Ergocalciferol, (DRISDOL) 1.25 MG (50000 UNIT) CAPS capsule, Take 1 capsule (50,000 Units total) by mouth every 7 (seven) days., Disp: 12 capsule, Rfl: 1  Observations/Objective: Patient is well-developed, well-nourished in no acute distress.  Resting comfortably at home.  Head is normocephalic, atraumatic.  No labored breathing.  Speech is clear and coherent with logical content.  Patient is alert and oriented at baseline.    Assessment and Plan: 1. Acute bacterial sinusitis - amoxicillin-clavulanate (AUGMENTIN) 875-125 MG tablet; Take 1 tablet by mouth 2 (two) times daily.  Dispense: 20 tablet; Refill: 0 - promethazine-dextromethorphan (PROMETHAZINE-DM)  6.25-15 MG/5ML syrup; Take 5 mLs by mouth 4 (four) times daily as needed.  Dispense: 118 mL; Refill: 0  2. Antibiotic-induced yeast infection - fluconazole (DIFLUCAN) 150 MG tablet; Take 1 tablet (150 mg total) by mouth every 3 (three) days as needed.  Dispense: 3 tablet; Refill: 0  - Worsening symptoms that have not responded to OTC medications.  - Will give Augmentin - Promethazine DM for cough - Continue allergy medications.  - Steam and humidifier can help - Stay well hydrated and get plenty of rest.  - Diflucan given as prophylaxis as patient tends to get vaginal yeast infections with antibiotic use - Seek in person evaluation if no symptom improvement or if symptoms worsen   Follow Up Instructions: I discussed the assessment and treatment plan with the patient. The patient was provided an opportunity to ask questions and all were answered. The patient agreed with the plan and demonstrated an understanding of the instructions.  A copy of instructions were sent to the patient via MyChart unless otherwise noted below.    The patient was advised to call back or seek an in-person evaluation if the symptoms worsen or if the condition fails to improve as anticipated.  Time:  I spent 10 minutes with the patient via telehealth technology discussing the above problems/concerns.    Kelli Loveless, PA-C

## 2022-12-30 ENCOUNTER — Telehealth: Payer: Self-pay | Admitting: Family Medicine

## 2022-12-30 NOTE — Telephone Encounter (Signed)
[name], with [facility] has called and stated they received an order for sleep study, and [employer] is requesting a peer to peer review by the physician.  Need to call [employer] for Review #: [id number]  CASE # [id number]  Per Norm Parcel is requesting this to be done within 24 hours.   Terry's callback # [phone] ( call [name] make after this determination so they know how to proceed )

## 2022-12-31 DIAGNOSIS — F331 Major depressive disorder, recurrent, moderate: Secondary | ICD-10-CM | POA: Diagnosis not present

## 2023-01-01 NOTE — Telephone Encounter (Signed)
[name] was called and given approval information for the patient, Kelli Wells states that she will reach out to the patient to get them scheduled.

## 2023-01-01 NOTE — Telephone Encounter (Signed)
Please let [name] with the sleep lab know that I have called the patient's insurance.  She has been approved for a home sleep study which Dr. Alvis Lemmings ordered.  Approval number is [id number].  Approval timeframe is August 19 through October 17.

## 2023-01-14 DIAGNOSIS — F411 Generalized anxiety disorder: Secondary | ICD-10-CM | POA: Diagnosis not present

## 2023-01-20 ENCOUNTER — Ambulatory Visit: Payer: BC Managed Care – PPO | Admitting: Family Medicine

## 2023-01-22 DIAGNOSIS — F411 Generalized anxiety disorder: Secondary | ICD-10-CM | POA: Diagnosis not present

## 2023-02-03 ENCOUNTER — Ambulatory Visit: Payer: BC Managed Care – PPO | Admitting: Dermatology

## 2023-02-04 DIAGNOSIS — F411 Generalized anxiety disorder: Secondary | ICD-10-CM | POA: Diagnosis not present

## 2023-02-07 ENCOUNTER — Encounter (HOSPITAL_BASED_OUTPATIENT_CLINIC_OR_DEPARTMENT_OTHER): Payer: BC Managed Care – PPO | Admitting: Internal Medicine

## 2023-02-13 DIAGNOSIS — F411 Generalized anxiety disorder: Secondary | ICD-10-CM | POA: Diagnosis not present

## 2023-02-20 DIAGNOSIS — F411 Generalized anxiety disorder: Secondary | ICD-10-CM | POA: Diagnosis not present

## 2023-02-25 ENCOUNTER — Other Ambulatory Visit: Payer: Self-pay | Admitting: Family Medicine

## 2023-02-25 DIAGNOSIS — N760 Acute vaginitis: Secondary | ICD-10-CM

## 2023-02-25 NOTE — Telephone Encounter (Signed)
Requested medication (s) are due for refill today - no  Requested medication (s) are on the active medication list -yes  Future visit scheduled -yes  Last refill: 07/15/22 70g 6RF  Notes to clinic: off protocol- provider review   Requested Prescriptions  Pending Prescriptions Disp Refills   metroNIDAZOLE (METROGEL) 0.75 % vaginal gel [Pharmacy Med Name: metroNIDAZOLE 0.75 % Vaginal Gel] 70 g 0    Sig: INSERT 1 APPLICATORFUL VAGINALLY TWICE A WEEK FOR  6  MONTHS  BACTERIAL  VAGINOSIS  PROPHYLAXIS     Off-Protocol Failed - 02/25/2023  9:35 AM      Failed - Medication not assigned to a protocol, review manually.      Passed - Valid encounter within last 12 months    Recent Outpatient Visits           7 months ago Other tobacco product nicotine dependence, uncomplicated   [place] [facility] [place], Odette Horns, MD   1 year ago Prediabetes   [place] [facility] Hoy Register, MD   1 year ago Essential hypertension   [place] [facility] Drucilla Chalet, RPH-CPP   1 year ago Anxiety and depression   [place] [facility] Hoy Register, MD   1 year ago Abscess of right breast   [place] [facility] Hoy Register, MD       Future Appointments             In 1 month Hoy Register, MD [facility]   In 4 months Terri Piedra, DO [facility]               Requested Prescriptions  Pending Prescriptions Disp Refills   metroNIDAZOLE (METROGEL) 0.75 % vaginal gel [Pharmacy Med Name: metroNIDAZOLE 0.75 % Vaginal Gel] 70 g 0    Sig: INSERT 1 APPLICATORFUL VAGINALLY TWICE A WEEK FOR  6  MONTHS  BACTERIAL  VAGINOSIS  PROPHYLAXIS     Off-Protocol Failed - 02/25/2023  9:35 AM      Failed - Medication not assigned to a protocol, review manually.      Passed - Valid encounter within last 12  months    Recent Outpatient Visits           7 months ago Other tobacco product nicotine dependence, uncomplicated   [place] [facility] [place], Odette Horns, MD   1 year ago Prediabetes   [place] [facility] Hoy Register, MD   1 year ago Essential hypertension   [facility] [place], Cornelius Moras, RPH-CPP   1 year ago Anxiety and depression   [place] [facility] Hoy Register, MD   1 year ago Abscess of right breast   [place] [facility] Hoy Register, MD       Future Appointments             In 1 month Hoy Register, MD [facility]   In 4 months Terri Piedra, DO [facility]

## 2023-02-27 ENCOUNTER — Ambulatory Visit (HOSPITAL_BASED_OUTPATIENT_CLINIC_OR_DEPARTMENT_OTHER): Payer: BC Managed Care – PPO | Attending: Family Medicine | Admitting: Internal Medicine

## 2023-02-27 VITALS — Ht 62.0 in | Wt 191.0 lb

## 2023-02-27 DIAGNOSIS — R0683 Snoring: Secondary | ICD-10-CM

## 2023-02-27 DIAGNOSIS — R29818 Other symptoms and signs involving the nervous system: Secondary | ICD-10-CM | POA: Diagnosis not present

## 2023-02-28 DIAGNOSIS — F411 Generalized anxiety disorder: Secondary | ICD-10-CM | POA: Diagnosis not present

## 2023-03-10 DIAGNOSIS — F411 Generalized anxiety disorder: Secondary | ICD-10-CM | POA: Diagnosis not present

## 2023-03-10 DIAGNOSIS — F331 Major depressive disorder, recurrent, moderate: Secondary | ICD-10-CM | POA: Diagnosis not present

## 2023-03-11 DIAGNOSIS — F411 Generalized anxiety disorder: Secondary | ICD-10-CM | POA: Diagnosis not present

## 2023-03-18 DIAGNOSIS — F411 Generalized anxiety disorder: Secondary | ICD-10-CM | POA: Diagnosis not present

## 2023-03-23 DIAGNOSIS — R29818 Other symptoms and signs involving the nervous system: Secondary | ICD-10-CM | POA: Diagnosis not present

## 2023-03-23 NOTE — Procedures (Signed)
   Patient Name: Kelli Wells, Kelli Wells Date: 02/27/2023 Gender: Female D.O.B: April 30, 1986 Age (years): 36 Referring Provider: Jaclyn Shaggy Height (inches): 62 Interpreting Physician: Jetty Duhamel MD, ABSM Weight (lbs): 191 RPSGT: [place] BMI: 35 MRN: [id number] Neck Size: 14.00  CLINICAL INFORMATION Sleep Study Type: NPSG Indication for sleep study: Fatigue, Hypertension, Obesity, Snoring Epworth Sleepiness Score: 12  SLEEP STUDY TECHNIQUE As per the AASM Manual for the Scoring of Sleep and Associated Events v2.3 (April 2016) with a hypopnea requiring 4% desaturations.  The channels recorded and monitored were frontal, central and occipital EEG, electrooculogram (EOG), submentalis EMG (chin), nasal and oral airflow, thoracic and abdominal wall motion, anterior tibialis EMG, snore microphone, electrocardiogram, and pulse oximetry.  MEDICATIONS Medications self-administered by patient taken the night of the study : Hydroxzine, NASACORT  SLEEP ARCHITECTURE The study was initiated at 10:14:47 PM and ended at 5:42:20 AM.  Sleep onset time was 11.9 minutes and the sleep efficiency was 90.4%. The total sleep time was 404.5 minutes.  Stage REM latency was 201.5 minutes.  The patient spent 8.8% of the night in stage N1 sleep, 71.6% in stage N2 sleep, 11.9% in stage N3 and 7.8% in REM.  Alpha intrusion was absent.  Supine sleep was 0.00%.  RESPIRATORY PARAMETERS The overall apnea/hypopnea index (AHI) was 1.2 per hour. There were 0 total apneas, including 0 obstructive, 0 central and 0 mixed apneas. There were 8 hypopneas and 2 RERAs.  The AHI during Stage REM sleep was 0.0 per hour.  AHI while supine was N/A per hour.  The mean oxygen saturation was 94.0%. The minimum SpO2 during sleep was 90.0%.  moderate snoring was noted during this study.  CARDIAC DATA The 2 lead EKG demonstrated sinus rhythm. The mean heart rate was 77.9 beats per minute. Other EKG findings  include: None.  LEG MOVEMENT DATA The total PLMS were 0 with a resulting PLMS index of 0.0. Associated arousal with leg movement index was 0.0 .  IMPRESSIONS - No significant obstructive sleep apnea occurred during this study (AHI = 1.2/h). - The patient had minimal or no oxygen desaturation during the study (Min O2 = 90.0%) - The patient snored with moderate snoring volume. - No cardiac abnormalities were noted during this study. - Clinically significant periodic limb movements did not occur during sleep. No significant associated arousals.  DIAGNOSIS - Primary Snoring  RECOMMENDATIONS - Manage for snoring and symptoms based on clinical judgment. - Be careful with alcohol, sedatives and other CNS depressants that may worsen sleep apnea and disrupt normal sleep architecture. - Sleep hygiene should be reviewed to assess factors that may improve sleep quality. - Weight management and regular exercise should be initiated or continued if appropriate.  [Electronically signed] 03/23/2023 11:27 AM  Jetty Duhamel MD, ABSM Diplomate, American Board of Sleep Medicine NPI: [id number]                          Jetty Duhamel Diplomate, American Board of Sleep Medicine  ELECTRONICALLY SIGNED ON:  03/23/2023, 11:25 AM [facility] PH: [phone]   FX: [phone] ACCREDITED BY THE AMERICAN ACADEMY OF SLEEP MEDICINE

## 2023-03-24 DIAGNOSIS — F411 Generalized anxiety disorder: Secondary | ICD-10-CM | POA: Diagnosis not present

## 2023-03-27 ENCOUNTER — Other Ambulatory Visit: Payer: Self-pay | Admitting: Family Medicine

## 2023-03-27 DIAGNOSIS — I10 Essential (primary) hypertension: Secondary | ICD-10-CM

## 2023-03-27 DIAGNOSIS — N760 Acute vaginitis: Secondary | ICD-10-CM

## 2023-03-27 NOTE — Telephone Encounter (Signed)
Medication Refill -  Most Recent Primary Care Visit:  Provider: Hoy Register  Department: [facility]  Visit Type: OFFICE VISIT  Date: 07/15/2022  Medication:  amLODipine (NORVASC) 10 MG tablet    Has the patient contacted their pharmacy? Yes No refills, contact physician  Is this the correct pharmacy for this prescription? Yes If no, delete pharmacy and type the correct one.  This is the patient's preferred pharmacy: [facility] [address] [place] - [address] [place] [address] Phone: [phone] Fax: [phone]  Has the prescription been filled recently? Yes  Is the patient out of the medication? Yes Has been out of medication for 4  days  Has the patient been seen for an appointment in the last year OR does the patient have an upcoming appointment? Yes  Can we respond through MyChart? Yes  Agent: Please be advised that Rx refills may take up to 3 business days. We ask that you follow-up with your pharmacy.

## 2023-03-28 MED ORDER — METRONIDAZOLE 0.75 % VA GEL
VAGINAL | 0 refills | Status: DC
Start: 2023-03-28 — End: 2023-09-01

## 2023-03-28 NOTE — Telephone Encounter (Signed)
Request was refilled 03/27/23 for 30 days, duplicate request.  Requested Prescriptions  Pending Prescriptions Disp Refills   amLODipine (NORVASC) 10 MG tablet 90 tablet 1    Sig: Take 1 tablet (10 mg total) by mouth daily.     Cardiovascular: Calcium Channel Blockers 2 Failed - 03/27/2023  4:33 PM      Failed - Valid encounter within last 6 months    Recent Outpatient Visits           8 months ago Other tobacco product nicotine dependence, uncomplicated   [place] [facility] [place]. [facility] Hoy Register, MD   1 year ago Prediabetes   [place] [facility] [place] [facility] [place]. [facility] Hoy Register, MD   1 year ago Essential hypertension   [place] [facility] [place] [facility] [place]. [facility] Drucilla Chalet, RPH-CPP   1 year ago Anxiety and depression   [place] [facility] [place] [facility] Dept Of [place]. [facility] Hoy Register, MD   1 year ago Abscess of right breast   [place] [facility] [place] [facility] Dept Of [place]. [facility] Hoy Register, MD       Future Appointments             In 1 week Hoy Register, MD [facility] - [facility] Of [place]a Bridegroom. [facility]   In 3 months Terri Piedra, DO [facility]            Passed - Last BP in normal range    BP Readings from Last 1 Encounters:  07/15/22 129/84         Passed - Last Heart Rate in normal range    Pulse Readings from Last 1 Encounters:  07/15/22 78

## 2023-03-31 ENCOUNTER — Telehealth: Payer: Self-pay | Admitting: Family Medicine

## 2023-03-31 NOTE — Telephone Encounter (Signed)
Sleep study was resulted last week by sleep specialist and did not reveal evidence of sleep apnea.

## 2023-03-31 NOTE — Telephone Encounter (Signed)
Patient has called, requesting a call back or message via MyChart about sleep study results from a sleep study that she had done last month. Please advise and contact patient back @ # [phone].

## 2023-04-01 NOTE — Telephone Encounter (Addendum)
Spoke with patient . Patient at advised PCP"Sleep study was resulted last week by sleep specialist and did not reveal evidence of sleep apnea.

## 2023-04-07 ENCOUNTER — Other Ambulatory Visit (HOSPITAL_BASED_OUTPATIENT_CLINIC_OR_DEPARTMENT_OTHER): Payer: Self-pay

## 2023-04-09 ENCOUNTER — Ambulatory Visit: Payer: BC Managed Care – PPO | Admitting: Family Medicine

## 2023-04-21 DIAGNOSIS — F411 Generalized anxiety disorder: Secondary | ICD-10-CM | POA: Diagnosis not present

## 2023-04-24 ENCOUNTER — Ambulatory Visit: Payer: BC Managed Care – PPO | Admitting: Physician Assistant

## 2023-04-30 ENCOUNTER — Other Ambulatory Visit: Payer: Self-pay | Admitting: Family Medicine

## 2023-04-30 DIAGNOSIS — I10 Essential (primary) hypertension: Secondary | ICD-10-CM

## 2023-04-30 NOTE — Telephone Encounter (Signed)
Requested medication (s) are due for refill today: yes  Requested medication (s) are on the active medication list: yes  Last refill:  03/27/23 #30  Future visit scheduled: yes  Notes to clinic:  pt with high cancel rate- unsure if could refill until appt due.   Requested Prescriptions  Pending Prescriptions Disp Refills   amLODipine (NORVASC) 10 MG tablet [Pharmacy Med Name: amLODIPine Besylate 10 MG Oral Tablet] 30 tablet 0    Sig: Take 1 tablet by mouth once daily     Cardiovascular: Calcium Channel Blockers 2 Failed - 04/30/2023  1:53 PM      Failed - Valid encounter within last 6 months    Recent Outpatient Visits           9 months ago Other tobacco product nicotine dependence, uncomplicated   [place] [facility] [place] [facility] [place]. [facility] Hoy Register, MD   1 year ago Prediabetes   [place] [facility] [place] [facility] Dept Of [place]. [facility] Hoy Register, MD   1 year ago Essential hypertension   [place] [facility] [place] [facility] Dept Of [place]. [facility] Drucilla Chalet, RPH-CPP   1 year ago Anxiety and depression   [place] [facility] [place] [facility] Dept Of [place]. [facility] Hoy Register, MD   1 year ago Abscess of right breast    [facility] [place] - A Dept Of [place]. [facility] Hoy Register, MD       Future Appointments             In 2 weeks Kelli Wells, [place]  [facility] [place] - A Dept Of [place] Bridegroom. [facility]   In 2 months Terri Piedra, DO [facility]            Passed - Last BP in normal range    BP Readings from Last 1 Encounters:  07/15/22 129/84         Passed - Last Heart Rate in normal range    Pulse Readings from Last 1 Encounters:  07/15/22 78

## 2023-05-01 DIAGNOSIS — F411 Generalized anxiety disorder: Secondary | ICD-10-CM | POA: Diagnosis not present

## 2023-05-15 ENCOUNTER — Ambulatory Visit: Payer: BC Managed Care – PPO | Admitting: Physician Assistant

## 2023-05-15 DIAGNOSIS — F411 Generalized anxiety disorder: Secondary | ICD-10-CM | POA: Diagnosis not present

## 2023-05-19 DIAGNOSIS — H5213 Myopia, bilateral: Secondary | ICD-10-CM | POA: Diagnosis not present

## 2023-05-20 DIAGNOSIS — F411 Generalized anxiety disorder: Secondary | ICD-10-CM | POA: Diagnosis not present

## 2023-05-22 DIAGNOSIS — F411 Generalized anxiety disorder: Secondary | ICD-10-CM | POA: Diagnosis not present

## 2023-05-29 ENCOUNTER — Ambulatory Visit: Payer: BC Managed Care – PPO | Attending: Physician Assistant | Admitting: Physician Assistant

## 2023-05-29 ENCOUNTER — Encounter: Payer: Self-pay | Admitting: Physician Assistant

## 2023-05-29 ENCOUNTER — Encounter: Payer: Self-pay | Admitting: Family Medicine

## 2023-05-29 VITALS — BP 150/76 | HR 80 | Ht 62.0 in | Wt 189.0 lb

## 2023-05-29 DIAGNOSIS — J4520 Mild intermittent asthma, uncomplicated: Secondary | ICD-10-CM | POA: Diagnosis not present

## 2023-05-29 DIAGNOSIS — I10 Essential (primary) hypertension: Secondary | ICD-10-CM | POA: Diagnosis not present

## 2023-05-29 DIAGNOSIS — J069 Acute upper respiratory infection, unspecified: Secondary | ICD-10-CM | POA: Diagnosis not present

## 2023-05-29 DIAGNOSIS — E559 Vitamin D deficiency, unspecified: Secondary | ICD-10-CM

## 2023-05-29 DIAGNOSIS — R5383 Other fatigue: Secondary | ICD-10-CM

## 2023-05-29 DIAGNOSIS — R7303 Prediabetes: Secondary | ICD-10-CM | POA: Diagnosis not present

## 2023-05-29 MED ORDER — AMLODIPINE BESYLATE 10 MG PO TABS: 10.0000 mg | ORAL_TABLET | Freq: Every day | ORAL | 1 refills | Status: DC

## 2023-05-29 MED ORDER — IPRATROPIUM-ALBUTEROL 0.5-2.5 (3) MG/3ML IN SOLN: 3.0000 mL | Freq: Four times a day (QID) | RESPIRATORY_TRACT | 0 refills | Status: AC | PRN

## 2023-05-29 MED ORDER — CARVEDILOL 25 MG PO TABS: 25.0000 mg | ORAL_TABLET | Freq: Two times a day (BID) | ORAL | 1 refills | Status: DC

## 2023-05-29 NOTE — Patient Instructions (Signed)
Work at a goal of eliminating sugary drinks, candy, desserts, sweets, refined sugars, processed foods, and white carbohydrates.    Drink 64 to 80 ounces water daily

## 2023-05-29 NOTE — Progress Notes (Signed)
Patient ID: Kelli Wells, female   DOB: Sep 21, 1985, 38 y.o.   MRN: [id number]   Kelli Wells, is a 38 y.o. female  ION:[id number]  [id number]  DOB - [birth year]  Chief Complaint  Patient presents with   Medical Management of Chronic Issues       Subjective:   [name] is a 38 y.o. female here today for med RF.  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  She denies HA/CP/dizziness.    She does complain of fatigue all the time.  She does not have periods so no bleeding.  Has norplant/similar.    Needs to recheck vitamin D deficiency and prediabetes.  Has not had labs for anything other than cholesterol in almost 2 years.  A lot of people in her family have diabetes.  Sleep is ok-takes trazadone occasionally.    She is just getting over a cold.  She does not have to use inhaler or breathing machine often but when she is sick she definitely needs it.  Her nebules were expired and she needs a new Rx.  She still has inhaler RF.    No problems updated.  ALLERGIES: Allergies  Allergen Reactions   Other Other (See Comments)   Shellfish Allergy Swelling    Facial swelling    Shrimp (Diagnostic) Anxiety, Hives, Itching and Swelling    PAST MEDICAL HISTORY: Past Medical History:  Diagnosis Date   Anxiety    Asthma    Chronic hypertension in pregnancy    Depression    GERD (gastroesophageal reflux disease)    Hypertension    Infection    UTI   Pregnancy induced hypertension    Vaginal Pap smear, abnormal    LEEP, normal since    MEDICATIONS AT HOME: Prior to Admission medications   Medication Sig Start Date End Date Taking? Authorizing Provider  albuterol (VENTOLIN HFA) 108 (90 Base) MCG/ACT inhaler Inhale 1-2 puffs into the lungs every 6 (six) hours as needed for wheezing or shortness of breath. 07/15/22  Yes Newlin, Odette Horns, MD  amphetamine-dextroamphetamine (ADDERALL) 30 MG  tablet Take 1 tablet by mouth daily. 05/20/23  Yes [provider]  Blood Pressure Monitoring (BLOOD PRESSURE CUFF) MISC Use to check blood pressure once daily. 09/07/21  Yes Hoy Register, MD  buPROPion (WELLBUTRIN XL) 300 MG 24 hr tablet Take 1 tablet (300 mg total) by mouth every morning. 01/08/22  Yes Newlin, Enobong, MD  clindamycin (CLEOCIN T) 1 % external solution Apply topically 2 (two) times daily. For hidradenitis 07/15/22  Yes Hoy Register, MD  clonazePAM (KLONOPIN) 0.5 MG tablet Take 0.5 mg by mouth daily. 05/20/23  Yes [provider]  fluticasone (FLONASE) 50 MCG/ACT nasal spray Place 2 sprays into both nostrils daily. 06/21/19  Yes Hoy Register, MD  hydrOXYzine (ATARAX) 10 MG tablet Take 10 mg by mouth 3 (three) times daily. 03/30/23  Yes [provider]  loratadine (CLARITIN) 10 MG tablet Take 1 tablet (10 mg total) by mouth daily. 10/20/18  Yes Hoy Register, MD  methocarbamol (ROBAXIN) 500 MG tablet Take 1 tablet (500 mg total) by mouth every 8 (eight) hours as needed for muscle spasms. 01/08/22  Yes Newlin, Odette Horns, MD  metroNIDAZOLE (METROGEL) 0.75 % vaginal gel INSERT 1 APPLICATORFUL VAGINALLY TWICE A WEEK FOR  6  MONTHS  BACTERIAL  VAGINOSIS  PROPHYLAXIS 03/28/23  Yes Newlin, Enobong, MD  naproxen (NAPROSYN) 500 MG tablet Take 1  tablet (500 mg total) by mouth 2 (two) times daily with a meal. 09/29/18  Yes Newlin, Enobong, MD  promethazine-dextromethorphan (PROMETHAZINE-DM) 6.25-15 MG/5ML syrup Take 5 mLs by mouth 4 (four) times daily as needed. 12/25/22  Yes Margaretann Loveless, PA-C  sertraline (ZOLOFT) 100 MG tablet Take 1.5 tablets (150 mg total) by mouth daily. 12/02/22  Yes Hoy Register, MD  traZODone (DESYREL) 50 MG tablet Take 1 tablet (50 mg total) by mouth at bedtime as needed for sleep. 05/22/21  Yes Hoy Register, MD  amLODipine (NORVASC) 10 MG tablet Take 1 tablet (10 mg total) by mouth daily. 05/29/23   Anders Simmonds, PA-C  carvedilol  (COREG) 25 MG tablet Take 1 tablet (25 mg total) by mouth 2 (two) times daily with a meal. 05/29/23   Angle Dirusso, Marzella Schlein, PA-C  cloNIDine HCl (KAPVAY) 0.1 MG TB12 ER tablet Take 0.1 mg by mouth daily. Patient not taking: Reported on 05/29/2023 08/24/22   [provider]  EPINEPHrine (EPIPEN 2-PAK) 0.3 mg/0.3 mL IJ SOAJ injection Inject 0.3 mLs (0.3 mg total) into the muscle as needed for anaphylaxis. Patient not taking: Reported on 05/29/2023 09/08/19   Zadie Rhine, MD  fluconazole (DIFLUCAN) 150 MG tablet Take 1 tablet (150 mg total) by mouth every 3 (three) days as needed. Patient not taking: Reported on 05/29/2023 12/25/22   Margaretann Loveless, PA-C  hydrOXYzine (ATARAX) 25 MG tablet Take 1 tablet (25 mg total) by mouth 3 (three) times daily as needed. Patient not taking: Reported on 05/29/2023 09/18/22   Hoy Register, MD  ipratropium-albuterol (DUONEB) 0.5-2.5 (3) MG/3ML SOLN USE 1 AMPULE IN NEBULIZER VIA NEBULIZATION EVERY 6 HOURS AS NEEDED 03/06/20 03/06/21  Wieters, Hallie C, PA-C  ipratropium-albuterol (DUONEB) 0.5-2.5 (3) MG/3ML SOLN Take 3 mLs by nebulization every 6 (six) hours as needed. 05/29/23   Anders Simmonds, PA-C  mupirocin ointment (BACTROBAN) 2 % Apply 1 application topically daily. Patient not taking: Reported on 05/29/2023 07/06/21   Raspet, Erin K, PA-C  ondansetron (ZOFRAN-ODT) 4 MG disintegrating tablet Take 1 tablet (4 mg total) by mouth every 8 (eight) hours as needed for nausea or vomiting. Patient not taking: Reported on 05/29/2023 04/10/21   Waldon Merl, PA-C  Vitamin D, Ergocalciferol, (DRISDOL) 1.25 MG (50000 UNIT) CAPS capsule Take 1 capsule (50,000 Units total) by mouth every 7 (seven) days. Patient not taking: Reported on 05/29/2023 11/12/21   Hoy Register, MD    ROS: Neg HEENT Neg resp Neg cardiac Neg GI Neg GU Neg MS Neg psych Neg neuro  Objective:   Vitals:   05/29/23 1634 05/29/23 1645  BP: (!) 161/98 (!) 150/76  Pulse: 80   SpO2:  96%   Weight: 189 lb (85.7 kg)   Height: 5\' 2"  (1.575 m)    Exam General appearance : Awake, alert, not in any distress. Speech Clear. Not toxic looking HEENT: Atraumatic and Normocephalic Neck: Supple, no JVD. No cervical lymphadenopathy.  Chest: Good air entry bilaterally, CTAB.  No rales/rhonchi.  Minimal wheezing CVS: S1 S2 regular, no murmurs.  Extremities: B/L Lower Ext shows no edema, both legs are warm to touch Neurology: Awake alert, and oriented X 3, CN II-XII intact, Non focal Skin: No Rash  Data Review Lab Results  Component Value Date   HGBA1C 5.5 01/08/2022   HGBA1C 5.8 (H) 05/23/2021    Assessment & Plan   1. Essential hypertension Controlled out of office - carvedilol (COREG) 25 MG tablet; Take 1 tablet (25 mg total) by  mouth 2 (two) times daily with a meal.  Dispense: 180 tablet; Refill: 1 - amLODipine (NORVASC) 10 MG tablet; Take 1 tablet (10 mg total) by mouth daily.  Dispense: 90 tablet; Refill: 1 - Comprehensive metabolic panel  2. Prediabetes (Primary) Work at a goal of eliminating sugary drinks, candy, desserts, sweets, refined sugars, processed foods, and white carbohydrates.   - Comprehensive metabolic panel - Hemoglobin A1c  3. Vitamin D deficiency - Vitamin D, 25-hydroxy  4. Other fatigue - TSH - CBC with Differential  5. Viral URI Seems to be improving - ipratropium-albuterol (DUONEB) 0.5-2.5 (3) MG/3ML SOLN; Take 3 mLs by nebulization every 6 (six) hours as needed.  Dispense: 120 mL; Refill: 0  6. Mild intermittent asthma without complication - ipratropium-albuterol (DUONEB) 0.5-2.5 (3) MG/3ML SOLN; Take 3 mLs by nebulization every 6 (six) hours as needed.  Dispense: 120 mL; Refill: 0    Return in about 6 months (around 11/26/2023) for PCP for chronic conditions-Newlin.  The patient was given clear instructions to go to ER or return to medical center if symptoms don't improve, worsen or new problems develop. The patient verbalized  understanding. The patient was told to call to get lab results if they haven't heard anything in the next week.      Georgian Co, PA-C [facility] [place], [place] [phone]   05/29/2023, 5:01 PM

## 2023-05-30 ENCOUNTER — Telehealth: Payer: Self-pay

## 2023-05-30 ENCOUNTER — Telehealth: Payer: Self-pay | Admitting: Family Medicine

## 2023-05-30 ENCOUNTER — Other Ambulatory Visit: Payer: Self-pay | Admitting: Physician Assistant

## 2023-05-30 DIAGNOSIS — E559 Vitamin D deficiency, unspecified: Secondary | ICD-10-CM

## 2023-05-30 LAB — CBC WITH DIFFERENTIAL/PLATELET
Basophils Absolute: 0 10*3/uL (ref 0.0–0.2)
Basos: 0 %
EOS (ABSOLUTE): 0.4 10*3/uL (ref 0.0–0.4)
Eos: 5 %
Hematocrit: 46.1 % (ref 34.0–46.6)
Hemoglobin: 15 g/dL (ref 11.1–15.9)
Immature Grans (Abs): 0 10*3/uL (ref 0.0–0.1)
Immature Granulocytes: 0 %
Lymphocytes Absolute: 4.1 10*3/uL — ABNORMAL HIGH (ref 0.7–3.1)
Lymphs: 48 %
MCH: 28.7 pg (ref 26.6–33.0)
MCHC: 32.5 g/dL (ref 31.5–35.7)
MCV: 88 fL (ref 79–97)
Monocytes Absolute: 1 10*3/uL — ABNORMAL HIGH (ref 0.1–0.9)
Monocytes: 11 %
Neutrophils Absolute: 3.1 10*3/uL (ref 1.4–7.0)
Neutrophils: 36 %
Platelets: 411 10*3/uL (ref 150–450)
RBC: 5.22 x10E6/uL (ref 3.77–5.28)
RDW: 12.8 % (ref 11.7–15.4)
WBC: 8.6 10*3/uL (ref 3.4–10.8)

## 2023-05-30 LAB — COMPREHENSIVE METABOLIC PANEL
ALT: 16 [IU]/L (ref 0–32)
AST: 16 [IU]/L (ref 0–40)
Albumin: 4.1 g/dL (ref 3.9–4.9)
Alkaline Phosphatase: 123 [IU]/L — ABNORMAL HIGH (ref 44–121)
BUN/Creatinine Ratio: 7 — ABNORMAL LOW (ref 9–23)
BUN: 6 mg/dL (ref 6–20)
Bilirubin Total: <0.2 mg/dL (ref 0.0–1.2)
CO2: 20 mmol/L (ref 20–29)
Calcium: 9.4 mg/dL (ref 8.7–10.2)
Chloride: 105 mmol/L (ref 96–106)
Creatinine, Ser: 0.89 mg/dL (ref 0.57–1.00)
Globulin, Total: 2.7 g/dL (ref 1.5–4.5)
Glucose: 94 mg/dL (ref 70–99)
Potassium: 4.5 mmol/L (ref 3.5–5.2)
Sodium: 138 mmol/L (ref 134–144)
Total Protein: 6.8 g/dL (ref 6.0–8.5)
eGFR: 85 mL/min/{1.73_m2} (ref >59)

## 2023-05-30 LAB — VITAMIN D 25 HYDROXY (VIT D DEFICIENCY, FRACTURES): Vit D, 25-Hydroxy: 10.7 ng/mL — ABNORMAL LOW (ref 30.0–100.0)

## 2023-05-30 LAB — TSH: TSH: 1.61 u[IU]/mL (ref 0.450–4.500)

## 2023-05-30 LAB — HEMOGLOBIN A1C
Est. average glucose Bld gHb Est-mCnc: 114 mg/dL
Hgb A1c MFr Bld: 5.6 % (ref 4.8–5.6)

## 2023-05-30 MED ORDER — VITAMIN D (ERGOCALCIFEROL) 1.25 MG (50000 UNIT) PO CAPS: 50000.0000 [IU] | ORAL_CAPSULE | ORAL | 1 refills | Status: DC

## 2023-05-30 NOTE — Telephone Encounter (Signed)
Patient called to get her lab results, advised someone will reach out once her results come back

## 2023-05-30 NOTE — Telephone Encounter (Addendum)
Pt was called and is aware of results, DOB was confirmed.     ----- Message from Georgian Co sent at 05/30/2023 11:24 AM EST ----- Please call patient and let her know that vitamin D is VERY low.  This can contribute to muscle aches, anxiety, fatigue, and depression.  I have sent a prescription to the pharmacy for her to take once a week.  We will recheck this level in 3-4 months.   Thyroid is normal.  Kidney, liver, electrolytes, and blood count are normal.  A1C is normal.  No diabetes.  Thanks, Georgian Co, PA-C

## 2023-05-30 NOTE — Telephone Encounter (Signed)
noted 

## 2023-06-03 NOTE — Telephone Encounter (Signed)
Called patient and she is aware of provider note. Also patient had question about lymphocytes and monocytes, I went through the definition of both and also went over her of vit  D medication/ lab. Patient stated no further questions or concerns

## 2023-06-05 ENCOUNTER — Other Ambulatory Visit: Payer: Self-pay | Admitting: Physician Assistant

## 2023-06-05 DIAGNOSIS — F411 Generalized anxiety disorder: Secondary | ICD-10-CM | POA: Diagnosis not present

## 2023-06-05 DIAGNOSIS — B379 Candidiasis, unspecified: Secondary | ICD-10-CM

## 2023-06-05 MED ORDER — FLUCONAZOLE 150 MG PO TABS: 150.0000 mg | ORAL_TABLET | ORAL | 0 refills | Status: DC | PRN

## 2023-06-12 ENCOUNTER — Telehealth: Payer: BC Managed Care – PPO | Admitting: Physician Assistant

## 2023-06-12 DIAGNOSIS — R6889 Other general symptoms and signs: Secondary | ICD-10-CM | POA: Diagnosis not present

## 2023-06-12 MED ORDER — OSELTAMIVIR PHOSPHATE 75 MG PO CAPS: 75.0000 mg | ORAL_CAPSULE | Freq: Two times a day (BID) | ORAL | 0 refills | Status: AC

## 2023-06-12 MED ORDER — BENZONATATE 100 MG PO CAPS: 100.0000 mg | ORAL_CAPSULE | Freq: Three times a day (TID) | ORAL | 0 refills | Status: DC | PRN

## 2023-06-12 NOTE — Patient Instructions (Signed)
[employer], thank you for joining [name], PA-C for today's virtual visit.  While this provider is not your primary care provider (PCP), if your PCP is located in our provider database this encounter information will be shared with them immediately following your visit.   A [facility] MyChart account gives you access to today's visit and all your visits, tests, and labs performed at [facility] " click here if you don't have a [facility] MyChart account or go to [email or web address]  Consent: (Patient) Kelli Wells provided verbal consent for this virtual visit at the beginning of the encounter.  Current Medications:  Current Outpatient Medications:    albuterol (VENTOLIN HFA) 108 (90 Base) MCG/ACT inhaler, Inhale 1-2 puffs into the lungs every 6 (six) hours as needed for wheezing or shortness of breath., Disp: 18 g, Rfl: 6   amLODipine (NORVASC) 10 MG tablet, Take 1 tablet (10 mg total) by mouth daily., Disp: 90 tablet, Rfl: 1   amphetamine-dextroamphetamine (ADDERALL) 30 MG tablet, Take 1 tablet by mouth daily., Disp: , Rfl:    Blood Pressure Monitoring (BLOOD PRESSURE CUFF) MISC, Use to check blood pressure once daily., Disp: 1 each, Rfl: 0   buPROPion (WELLBUTRIN XL) 300 MG 24 hr tablet, Take 1 tablet (300 mg total) by mouth every morning., Disp: 30 tablet, Rfl: 0   carvedilol (COREG) 25 MG tablet, Take 1 tablet (25 mg total) by mouth 2 (two) times daily with a meal., Disp: 180 tablet, Rfl: 1   clindamycin (CLEOCIN T) 1 % external solution, Apply topically 2 (two) times daily. For hidradenitis, Disp: 60 mL, Rfl: 1   clonazePAM (KLONOPIN) 0.5 MG tablet, Take 0.5 mg by mouth daily., Disp: , Rfl:    cloNIDine HCl (KAPVAY) 0.1 MG TB12 ER tablet, Take 0.1 mg by mouth daily. (Patient not taking: Reported on 05/29/2023), Disp: , Rfl:    EPINEPHrine (EPIPEN 2-PAK) 0.3 mg/0.3 mL IJ SOAJ injection, Inject 0.3 mLs (0.3 mg total) into the muscle as  needed for anaphylaxis. (Patient not taking: Reported on 05/29/2023), Disp: 1 each, Rfl: 1   fluconazole (DIFLUCAN) 150 MG tablet, Take 1 tablet (150 mg total) by mouth every 3 (three) days as needed., Disp: 3 tablet, Rfl: 0   fluticasone (FLONASE) 50 MCG/ACT nasal spray, Place 2 sprays into both nostrils daily., Disp: 16 g, Rfl: 1   hydrOXYzine (ATARAX) 10 MG tablet, Take 10 mg by mouth 3 (three) times daily., Disp: , Rfl:    hydrOXYzine (ATARAX) 25 MG tablet, Take 1 tablet (25 mg total) by mouth 3 (three) times daily as needed. (Patient not taking: Reported on 05/29/2023), Disp: 90 tablet, Rfl: 0   ipratropium-albuterol (DUONEB) 0.5-2.5 (3) MG/3ML SOLN, USE 1 AMPULE IN NEBULIZER VIA NEBULIZATION EVERY 6 HOURS AS NEEDED, Disp: 360 mL, Rfl: 0   ipratropium-albuterol (DUONEB) 0.5-2.5 (3) MG/3ML SOLN, Take 3 mLs by nebulization every 6 (six) hours as needed., Disp: 120 mL, Rfl: 0   loratadine (CLARITIN) 10 MG tablet, Take 1 tablet (10 mg total) by mouth daily., Disp: 30 tablet, Rfl: 1   methocarbamol (ROBAXIN) 500 MG tablet, Take 1 tablet (500 mg total) by mouth every 8 (eight) hours as needed for muscle spasms., Disp: 90 tablet, Rfl: 1   metroNIDAZOLE (METROGEL) 0.75 % vaginal gel, INSERT 1 APPLICATORFUL VAGINALLY TWICE A WEEK FOR  6  MONTHS  BACTERIAL  VAGINOSIS  PROPHYLAXIS, Disp: 70 g, Rfl: 0   mupirocin ointment (BACTROBAN) 2 %, Apply 1 application topically daily. (Patient  not taking: Reported on 05/29/2023), Disp: 22 g, Rfl: 0   naproxen (NAPROSYN) 500 MG tablet, Take 1 tablet (500 mg total) by mouth 2 (two) times daily with a meal., Disp: 60 tablet, Rfl: 6   ondansetron (ZOFRAN-ODT) 4 MG disintegrating tablet, Take 1 tablet (4 mg total) by mouth every 8 (eight) hours as needed for nausea or vomiting. (Patient not taking: Reported on 05/29/2023), Disp: 20 tablet, Rfl: 0   promethazine-dextromethorphan (PROMETHAZINE-DM) 6.25-15 MG/5ML syrup, Take 5 mLs by mouth 4 (four) times daily as needed., Disp: 118  mL, Rfl: 0   sertraline (ZOLOFT) 100 MG tablet, Take 1.5 tablets (150 mg total) by mouth daily., Disp: 45 tablet, Rfl: 0   traZODone (DESYREL) 50 MG tablet, Take 1 tablet (50 mg total) by mouth at bedtime as needed for sleep., Disp: 30 tablet, Rfl: 6   Vitamin D, Ergocalciferol, (DRISDOL) 1.25 MG (50000 UNIT) CAPS capsule, Take 1 capsule (50,000 Units total) by mouth every 7 (seven) days., Disp: 12 capsule, Rfl: 1   Medications ordered in this encounter:  No orders of the defined types were placed in this encounter.    *If you need refills on other medications prior to your next appointment, please contact your pharmacy*  Follow-Up: Call back or seek an in-person evaluation if the symptoms worsen or if the condition fails to improve as anticipated.  [facility] [phone]  Other Instructions Please keep well-hydrated and try to get plenty of rest. If you have a humidifier, place it in the bedroom and run it at night. Start a saline nasal rinse for nasal congestion. You can consider use of a nasal steroid spray like Flonase or Nasacort OTC. You can alternate between Tylenol and Ibuprofen if needed for fever, body aches, headache and/or throat pain. Salt water-gargles and chloraseptic spray can be very beneficial for sore throat. Mucinex-DM for congestion or cough. Please take all prescribed medications as directed.  Remain out of work until [employer] for 24 hours without a fever-reducing medication, and you are feeling better.  You should mask until symptoms are resolved.  If anything worsens despite treatment, you need to be evaluated in-person. Please do not delay care.  Influenza, Adult Influenza is also called "the flu." It is an infection in the lungs, nose, and throat (respiratory tract). It spreads easily from person to person (is contagious). The flu causes symptoms that are like a cold, along with high fever and body aches. What are the causes? This condition  is caused by the influenza virus. You can get the virus by: Breathing in droplets that are in the air after a person infected with the flu coughed or sneezed. Touching something that has the virus on it and then touching your mouth, nose, or eyes. What increases the risk? Certain things may make you more likely to get the flu. These include: Not washing your hands often. Having close contact with many people during cold and flu season. Touching your mouth, eyes, or nose without first washing your hands. Not getting a flu shot every year. You may have a higher risk for the flu, and serious problems, such as a lung infection (pneumonia), if you: Are older than 65. Are pregnant. Have a weakened disease-fighting system (immune system) because of a disease or because you are taking certain medicines. Have a long-term (chronic) condition, such as: Heart, kidney, or lung disease. Diabetes. Asthma. Have a liver disorder. Are very overweight (morbidly obese). Have anemia. What are the signs or  symptoms? Symptoms usually begin suddenly and last 4-14 days. They may include: Fever and chills. Headaches, body aches, or muscle aches. Sore throat. Cough. Runny or stuffy (congested) nose. Feeling discomfort in your chest. Not wanting to eat as much as normal. Feeling weak or tired. Feeling dizzy. Feeling sick to your stomach or throwing up. How is this treated? If the flu is found early, you can be treated with antiviral medicine. This can help to reduce how bad the illness is and how long it lasts. This may be given by mouth or through an IV tube. Taking care of yourself at home can help your symptoms get better. Your doctor may want you to: Take over-the-counter medicines. Drink plenty of fluids. The flu often goes away on its own. If you have very bad symptoms or other problems, you may be treated in a hospital. Follow these instructions at home:     Activity Rest as needed. Get plenty  of sleep. Stay home from work or school as told by your doctor. Do not leave home until you do not have a fever for 24 hours without taking medicine. Leave home only to go to your doctor. Eating and drinking Take an ORS (oral rehydration solution). This is a drink that is sold at pharmacies and stores. Drink enough fluid to keep your pee pale yellow. Drink clear fluids in small amounts as you are able. Clear fluids include: Water. Ice chips. Fruit juice mixed with water. Low-calorie sports drinks. Eat bland foods that are easy to digest. Eat small amounts as you are able. These foods include: Bananas. Applesauce. Rice. Lean meats. Toast. Crackers. Do not eat or drink: Fluids that have a lot of sugar or caffeine. Alcohol. Spicy or fatty foods. General instructions Take over-the-counter and prescription medicines only as told by your doctor. Use a cool mist humidifier to add moisture to the air in your home. This can make it easier for you to breathe. When using a cool mist humidifier, clean it daily. Empty water and replace with clean water. Cover your mouth and nose when you cough or sneeze. Wash your hands with soap and water often and for at least 20 seconds. This is also important after you cough or sneeze. If you cannot use soap and water, use alcohol-based hand sanitizer. Keep all follow-up visits. How is this prevented?  Get a flu shot every year. You may get the flu shot in late summer, fall, or winter. Ask your doctor when you should get your flu shot. Avoid contact with people who are sick during fall and winter. This is cold and flu season. Contact a doctor if: You get new symptoms. You have: Chest pain. Watery poop (diarrhea). A fever. Your cough gets worse. You start to have more mucus. You feel sick to your stomach. You throw up. Get help right away if you: Have shortness of breath. Have trouble breathing. Have skin or nails that turn a bluish color. Have  very bad pain or stiffness in your neck. Get a sudden headache. Get sudden pain in your face or ear. Cannot eat or drink without throwing up. These symptoms may represent a serious problem that is an emergency. Get medical help right away. Call your local emergency services (911 in the [place] Do not wait to see if the symptoms will go away. Do not drive yourself to the hospital. Summary Influenza is also called "the flu." It is an infection in the lungs, nose, and throat. It spreads easily from  person to person. Take over-the-counter and prescription medicines only as told by your doctor. Getting a flu shot every year is the best way to not get the flu. This information is not intended to replace advice given to you by your health care provider. Make sure you discuss any questions you have with your health care provider. Document Revised: 12/17/2019 Document Reviewed: 12/17/2019 Elsevier Patient Education  2023 Elsevier Inc.      If you have been instructed to have an in-person evaluation today at a local Urgent Care facility, please use the link below. It will take you to a list of all of our available [facility], including address, phone number and hours of operation. Please do not delay care.  [facility]  If you or a family member do not have a primary care provider, use the link below to schedule a visit and establish care. When you choose a [facility] primary care physician or advanced practice provider, you gain a long-term partner in health. Find a Primary Care Provider  Learn more about [facility]'s in-office and virtual care options: [facility] - Get Care Now

## 2023-06-12 NOTE — Progress Notes (Signed)
Virtual Visit Consent   Kelli Wells, you are scheduled for a virtual visit with a [facility] provider today. Just as with appointments in the office, your consent must be obtained to participate. Your consent will be active for this visit and any virtual visit you may have with one of our providers in the next 365 days. If you have a MyChart account, a copy of this consent can be sent to you electronically.  As this is a virtual visit, video technology does not allow for your provider to perform a traditional examination. This may limit your provider's ability to fully assess your condition. If your provider identifies any concerns that need to be evaluated in person or the need to arrange testing (such as labs, EKG, etc.), we will make arrangements to do so. Although advances in technology are sophisticated, we cannot ensure that it will always work on either your end or our end. If the connection with a video visit is poor, the visit may have to be switched to a telephone visit. With either a video or telephone visit, we are not always able to ensure that we have a secure connection.  By engaging in this virtual visit, you consent to the provision of healthcare and authorize for your insurance to be billed (if applicable) for the services provided during this visit. Depending on your insurance coverage, you may receive a charge related to this service.  I need to obtain your verbal consent now. Are you willing to proceed with your visit today? Kelli Wells has provided verbal consent on 06/12/2023 for a virtual visit (video or telephone). [name], [place]  Date: 06/12/2023 8:17 AM  Virtual Visit via Video Note   I, [name], connected with  Kelli Wells  ([id number], Oct 31, 1985) on 06/12/23 at  8:15 AM EST by a video-enabled telemedicine application and verified that I am speaking with the correct person using two identifiers.  Location: Patient:  Virtual Visit Location Patient: Home Provider: Virtual Visit Location Provider: Home Office   I discussed the limitations of evaluation and management by telemedicine and the availability of in person appointments. The patient expressed understanding and agreed to proceed.    History of Present Illness: Kelli Wells is a 38 y.o. who identifies as a female who was assigned female at birth, and is being seen today for possible flu. Notes symptoms starting quickly Tuesday with body aches, headache, fatigue, anorexia, chills, fever and nasal congestion/rhinorrhea, mild cough. Has history of asthma, but using her regular medications is keeping things under control. Took a home COVID test that was negative. Has been exposed to cousin who tested positive with the flu.   OTC -- Nyquil/Dayquil HBP  HPI: HPI  Problems:  Patient Active Problem List   Diagnosis Date Noted   Suspected sleep apnea 02/27/2023   Mild intermittent asthma 01/08/2022   Chronic allergic conjunctivitis 01/08/2022   Allergic rhinitis due to pollen 01/08/2022   Allergic rhinitis due to animal (cat) (dog) hair and dander 01/08/2022   Allergic rhinitis 01/08/2022   Adverse reaction to food 01/08/2022   Carrier of group B Streptococcus 01/08/2022   Vitamin D deficiency 12/21/2021   Stress 01/10/2021   Psychophysiological insomnia 01/10/2021   Abnormal cervical Papanicolaou smear 01/09/2021   Asthma    Cholecystitis with cholelithiasis 12/17/2014   Non-reactive NST (non-stress test)    Postpartum depression 02/02/2013   Esophageal reflux 02/02/2013   Hypertensive disorder 05/02/2011   Anxiety state 05/02/2011  Attention deficit hyperactivity disorder (ADHD) 05/02/2011    Allergies:  Allergies  Allergen Reactions   Other Other (See Comments)   Shellfish Allergy Swelling    Facial swelling    Shrimp (Diagnostic) Anxiety, Hives, Itching and Swelling   Medications:  Current Outpatient Medications:     benzonatate (TESSALON) 100 MG capsule, Take 1 capsule (100 mg total) by mouth 3 (three) times daily as needed for cough., Disp: 30 capsule, Rfl: 0   oseltamivir (TAMIFLU) 75 MG capsule, Take 1 capsule (75 mg total) by mouth 2 (two) times daily for 5 days., Disp: 10 capsule, Rfl: 0   albuterol (VENTOLIN HFA) 108 (90 Base) MCG/ACT inhaler, Inhale 1-2 puffs into the lungs every 6 (six) hours as needed for wheezing or shortness of breath., Disp: 18 g, Rfl: 6   amLODipine (NORVASC) 10 MG tablet, Take 1 tablet (10 mg total) by mouth daily., Disp: 90 tablet, Rfl: 1   amphetamine-dextroamphetamine (ADDERALL) 30 MG tablet, Take 1 tablet by mouth daily., Disp: , Rfl:    Blood Pressure Monitoring (BLOOD PRESSURE CUFF) MISC, Use to check blood pressure once daily., Disp: 1 each, Rfl: 0   buPROPion (WELLBUTRIN XL) 300 MG 24 hr tablet, Take 1 tablet (300 mg total) by mouth every morning., Disp: 30 tablet, Rfl: 0   carvedilol (COREG) 25 MG tablet, Take 1 tablet (25 mg total) by mouth 2 (two) times daily with a meal., Disp: 180 tablet, Rfl: 1   clindamycin (CLEOCIN T) 1 % external solution, Apply topically 2 (two) times daily. For hidradenitis, Disp: 60 mL, Rfl: 1   clonazePAM (KLONOPIN) 0.5 MG tablet, Take 0.5 mg by mouth daily., Disp: , Rfl:    cloNIDine HCl (KAPVAY) 0.1 MG TB12 ER tablet, Take 0.1 mg by mouth daily. (Patient not taking: Reported on 05/29/2023), Disp: , Rfl:    EPINEPHrine (EPIPEN 2-PAK) 0.3 mg/0.3 mL IJ SOAJ injection, Inject 0.3 mLs (0.3 mg total) into the muscle as needed for anaphylaxis. (Patient not taking: Reported on 05/29/2023), Disp: 1 each, Rfl: 1   fluticasone (FLONASE) 50 MCG/ACT nasal spray, Place 2 sprays into both nostrils daily., Disp: 16 g, Rfl: 1   hydrOXYzine (ATARAX) 10 MG tablet, Take 10 mg by mouth 3 (three) times daily., Disp: , Rfl:    hydrOXYzine (ATARAX) 25 MG tablet, Take 1 tablet (25 mg total) by mouth 3 (three) times daily as needed. (Patient not taking: Reported on  05/29/2023), Disp: 90 tablet, Rfl: 0   ipratropium-albuterol (DUONEB) 0.5-2.5 (3) MG/3ML SOLN, USE 1 AMPULE IN NEBULIZER VIA NEBULIZATION EVERY 6 HOURS AS NEEDED, Disp: 360 mL, Rfl: 0   ipratropium-albuterol (DUONEB) 0.5-2.5 (3) MG/3ML SOLN, Take 3 mLs by nebulization every 6 (six) hours as needed., Disp: 120 mL, Rfl: 0   loratadine (CLARITIN) 10 MG tablet, Take 1 tablet (10 mg total) by mouth daily., Disp: 30 tablet, Rfl: 1   methocarbamol (ROBAXIN) 500 MG tablet, Take 1 tablet (500 mg total) by mouth every 8 (eight) hours as needed for muscle spasms., Disp: 90 tablet, Rfl: 1   metroNIDAZOLE (METROGEL) 0.75 % vaginal gel, INSERT 1 APPLICATORFUL VAGINALLY TWICE A WEEK FOR  6  MONTHS  BACTERIAL  VAGINOSIS  PROPHYLAXIS, Disp: 70 g, Rfl: 0   naproxen (NAPROSYN) 500 MG tablet, Take 1 tablet (500 mg total) by mouth 2 (two) times daily with a meal., Disp: 60 tablet, Rfl: 6   sertraline (ZOLOFT) 100 MG tablet, Take 1.5 tablets (150 mg total) by mouth daily., Disp: 45 tablet, Rfl: 0  traZODone (DESYREL) 50 MG tablet, Take 1 tablet (50 mg total) by mouth at bedtime as needed for sleep., Disp: 30 tablet, Rfl: 6   Vitamin D, Ergocalciferol, (DRISDOL) 1.25 MG (50000 UNIT) CAPS capsule, Take 1 capsule (50,000 Units total) by mouth every 7 (seven) days., Disp: 12 capsule, Rfl: 1  Observations/Objective: Patient is well-developed, well-nourished in no acute distress.  Resting comfortably at home.  Head is normocephalic, atraumatic.  No labored breathing. Speech is clear and coherent with logical content.  Patient is alert and oriented at baseline.   Assessment and Plan: 1. Flu-like symptoms (Primary) - oseltamivir (TAMIFLU) 75 MG capsule; Take 1 capsule (75 mg total) by mouth 2 (two) times daily for 5 days.  Dispense: 10 capsule; Refill: 0 - benzonatate (TESSALON) 100 MG capsule; Take 1 capsule (100 mg total) by mouth 3 (three) times daily as needed for cough.  Dispense: 30 capsule; Refill: 0  Negative  COVID. Classic influenza symptoms. Known exposure. Supportive measures, OTC medications and Vitamin recommendations reviewed. Will start Tamiflu per orders. Tessalon per orders. Quarantine reviewed with patient.    Follow Up Instructions: I discussed the assessment and treatment plan with the patient. The patient was provided an opportunity to ask questions and all were answered. The patient agreed with the plan and demonstrated an understanding of the instructions.  A copy of instructions were sent to the patient via MyChart unless otherwise noted below.   The patient was advised to call back or seek an in-person evaluation if the symptoms worsen or if the condition fails to improve as anticipated.    [name], PA-C

## 2023-06-13 ENCOUNTER — Encounter: Payer: Self-pay | Admitting: Family Medicine

## 2023-06-13 NOTE — Telephone Encounter (Signed)
Copied from CRM [id number]. Topic: General - Other >> Jun 13, 2023 10:16 AM Kelli Wells wrote: Reason for CRM: patient called stated she has been out of work with the flu since Monday, She had a virtual visit on Thursday but need a work note for [employer] as she was too sick to go to work. Please f;/u with patient

## 2023-06-13 NOTE — Telephone Encounter (Signed)
Copied from CRM [id number]. Topic: Clinical - Medical Advice >> Jun 13, 2023 10:18 AM Patsy Lager T wrote: Reason for CRM: Patient stated she has been taking Tamiflu but her ears feels like a lot of pressure and she needs to know if she need another med or the Tamiflu will help with the symptoms

## 2023-06-13 NOTE — Telephone Encounter (Signed)
 Done

## 2023-06-13 NOTE — Telephone Encounter (Signed)
Copied from CRM [id number]. Topic: General - Other >> Jun 13, 2023 10:16 AM Patsy Lager T wrote: Reason for CRM: patient called stated she has been out of work with the flu since Monday, She had a virtual visit on Thursday but need a work note for [employer] as she was too sick to go to work. Please f;/u with patient

## 2023-06-13 NOTE — Telephone Encounter (Signed)
Called patient and she is aware

## 2023-06-19 DIAGNOSIS — F411 Generalized anxiety disorder: Secondary | ICD-10-CM | POA: Diagnosis not present

## 2023-06-27 DIAGNOSIS — F411 Generalized anxiety disorder: Secondary | ICD-10-CM | POA: Diagnosis not present

## 2023-07-02 ENCOUNTER — Ambulatory Visit: Payer: BC Managed Care – PPO | Admitting: Dermatology

## 2023-07-07 ENCOUNTER — Encounter: Payer: Self-pay | Admitting: Family Medicine

## 2023-07-07 ENCOUNTER — Telehealth (HOSPITAL_BASED_OUTPATIENT_CLINIC_OR_DEPARTMENT_OTHER): Payer: BC Managed Care – PPO | Admitting: Family Medicine

## 2023-07-07 DIAGNOSIS — Z029 Encounter for administrative examinations, unspecified: Secondary | ICD-10-CM

## 2023-07-07 NOTE — Progress Notes (Signed)
 Virtual Visit via Video Note  I connected with Kelli Wells, on 07/07/2023 at 1:34 PM by video enabled telemedicine device and verified that I am speaking with the correct person using two identifiers.   Consent: I discussed the limitations, risks, security and privacy concerns of performing an evaluation and management service by telemedicine and the availability of in person appointments. I also discussed with the patient that there may be a patient responsible charge related to this service. The patient expressed understanding and agreed to proceed.   Location of Patient: Work  Government social research officer of Provider: Clinic   Persons participating in Telemedicine visit: Annetta Maw [place] Dr. Alvis Lemmings     History of Present Illness: Kelli Wells is a 38 y.o. year old female  with  a history of hypertension, anxiety and depression, hidradenitis, prediabetes, nicotine dependence, asthma.   Discussed the use of AI scribe software for clinical note transcription with the patient, who gave verbal consent to proceed.  She presents for completion of short-term disability form due to a recent episode of the flu. Symptoms began on January 27th, with the patient experiencing body aches, chills, fever, cough, and fatigue. The severity of the symptoms led to the patient being unable to leave her house or get out of bed. The patient sought treatment via a virtual visit on January 30th, during which she was prescribed Tamiflu. The patient was unable to work from January 27th to February 6th due to the illness. The patient's next scheduled visit is on March 3rd, during which she plans to discuss ongoing gastrointestinal issues.        Past Medical History:  Diagnosis Date   Anxiety    Asthma    Chronic hypertension in pregnancy    Depression    GERD (gastroesophageal reflux disease)    Hypertension    Infection    UTI   Pregnancy induced hypertension    Vaginal Pap smear,  abnormal    LEEP, normal since   Allergies  Allergen Reactions   Other Other (See Comments)   Shellfish Allergy Swelling    Facial swelling    Shrimp (Diagnostic) Anxiety, Hives, Itching and Swelling    Current Outpatient Medications on File Prior to Visit  Medication Sig Dispense Refill   albuterol (VENTOLIN HFA) 108 (90 Base) MCG/ACT inhaler Inhale 1-2 puffs into the lungs every 6 (six) hours as needed for wheezing or shortness of breath. 18 g 6   amLODipine (NORVASC) 10 MG tablet Take 1 tablet (10 mg total) by mouth daily. 90 tablet 1   amphetamine-dextroamphetamine (ADDERALL) 30 MG tablet Take 1 tablet by mouth daily.     benzonatate (TESSALON) 100 MG capsule Take 1 capsule (100 mg total) by mouth 3 (three) times daily as needed for cough. 30 capsule 0   Blood Pressure Monitoring (BLOOD PRESSURE CUFF) MISC Use to check blood pressure once daily. 1 each 0   buPROPion (WELLBUTRIN XL) 300 MG 24 hr tablet Take 1 tablet (300 mg total) by mouth every morning. 30 tablet 0   carvedilol (COREG) 25 MG tablet Take 1 tablet (25 mg total) by mouth 2 (two) times daily with a meal. 180 tablet 1   clindamycin (CLEOCIN T) 1 % external solution Apply topically 2 (two) times daily. For hidradenitis 60 mL 1   clonazePAM (KLONOPIN) 0.5 MG tablet Take 0.5 mg by mouth daily.     cloNIDine HCl (KAPVAY) 0.1 MG TB12 ER tablet Take 0.1 mg by mouth daily. (Patient not  taking: Reported on 05/29/2023)     EPINEPHrine (EPIPEN 2-PAK) 0.3 mg/0.3 mL IJ SOAJ injection Inject 0.3 mLs (0.3 mg total) into the muscle as needed for anaphylaxis. (Patient not taking: Reported on 05/29/2023) 1 each 1   fluticasone (FLONASE) 50 MCG/ACT nasal spray Place 2 sprays into both nostrils daily. 16 g 1   hydrOXYzine (ATARAX) 10 MG tablet Take 10 mg by mouth 3 (three) times daily.     hydrOXYzine (ATARAX) 25 MG tablet Take 1 tablet (25 mg total) by mouth 3 (three) times daily as needed. (Patient not taking: Reported on 05/29/2023) 90 tablet  0   ipratropium-albuterol (DUONEB) 0.5-2.5 (3) MG/3ML SOLN USE 1 AMPULE IN NEBULIZER VIA NEBULIZATION EVERY 6 HOURS AS NEEDED 360 mL 0   ipratropium-albuterol (DUONEB) 0.5-2.5 (3) MG/3ML SOLN Take 3 mLs by nebulization every 6 (six) hours as needed. 120 mL 0   loratadine (CLARITIN) 10 MG tablet Take 1 tablet (10 mg total) by mouth daily. 30 tablet 1   methocarbamol (ROBAXIN) 500 MG tablet Take 1 tablet (500 mg total) by mouth every 8 (eight) hours as needed for muscle spasms. 90 tablet 1   metroNIDAZOLE (METROGEL) 0.75 % vaginal gel INSERT 1 APPLICATORFUL VAGINALLY TWICE A WEEK FOR  6  MONTHS  BACTERIAL  VAGINOSIS  PROPHYLAXIS 70 g 0   naproxen (NAPROSYN) 500 MG tablet Take 1 tablet (500 mg total) by mouth 2 (two) times daily with a meal. 60 tablet 6   sertraline (ZOLOFT) 100 MG tablet Take 1.5 tablets (150 mg total) by mouth daily. 45 tablet 0   traZODone (DESYREL) 50 MG tablet Take 1 tablet (50 mg total) by mouth at bedtime as needed for sleep. 30 tablet 6   Vitamin D, Ergocalciferol, (DRISDOL) 1.25 MG (50000 UNIT) CAPS capsule Take 1 capsule (50,000 Units total) by mouth every 7 (seven) days. 12 capsule 1   No current facility-administered medications on file prior to visit.    ROS: See HPI  Observations/Objective: Awake, alert, oriented x3 Not in acute distress Normal mood      Latest Ref Rng & Units 05/29/2023    5:05 PM 05/23/2021   10:33 AM 08/02/2020   10:01 AM  CMP  Glucose 70 - 99 mg/dL 94  88  102   BUN 6 - 20 mg/dL 6  9  8    Creatinine 0.57 - 1.00 mg/dL 7.25  3.66  4.40   Sodium 134 - 144 mmol/L 138  140  140   Potassium 3.5 - 5.2 mmol/L 4.5  4.1  4.3   Chloride 96 - 106 mmol/L 105  104  104   CO2 20 - 29 mmol/L 20  20  19    Calcium 8.7 - 10.2 mg/dL 9.4  9.3  9.6   Total Protein 6.0 - 8.5 g/dL 6.8  6.5    Total Bilirubin 0.0 - 1.2 mg/dL <3.4  <7.4    Alkaline Phos 44 - 121 IU/L 123  109    AST 0 - 40 IU/L 16  12    ALT 0 - 32 IU/L 16  16      Lipid Panel      Component Value Date/Time   CHOL 157 07/15/2022 0934   TRIG 108 07/15/2022 0934   HDL 52 07/15/2022 0934   CHOLHDL 3.1 09/26/2017 0906   CHOLHDL 3.4 01/01/2016 1020   VLDL 10 01/01/2016 1020   LDLCALC 85 07/15/2022 0934   LABVLDL 20 07/15/2022 0934    Lab Results  Component  Value Date   HGBA1C 5.6 05/29/2023     Assessment and Plan:     Administrative encounter due to episode of influenza Severe symptoms including body aches, chills, fever, cough, and fatigue starting on January 27th. Treated with Tamiflu after a virtual visit on January 30th. -Completed short term disability form indicating the patient was unable to work from January 27th to February 6th due to the severity of symptoms. -Fax the completed form to the patient's HR department at [phone].  Follow-up Next appointment scheduled for March 3rd. -Continue to monitor and manage symptoms as needed.        Follow Up Instructions: Keep upcoming appointment   I discussed the assessment and treatment plan with the patient. The patient was provided an opportunity to ask questions and all were answered. The patient agreed with the plan and demonstrated an understanding of the instructions.   The patient was advised to call back or seek an in-person evaluation if the symptoms worsen or if the condition fails to improve as anticipated.     I provided 11 minutes total of Telehealth time during this encounter including median intraservice time, reviewing previous notes, investigations, ordering medications, medical decision making, coordinating care and patient verbalized understanding at the end of the visit.     Hoy Register, MD, FAAFP. [facility] [place], [place] [phone]   07/07/2023, 1:34 PM

## 2023-07-10 DIAGNOSIS — F411 Generalized anxiety disorder: Secondary | ICD-10-CM | POA: Diagnosis not present

## 2023-07-11 ENCOUNTER — Telehealth: Payer: Self-pay | Admitting: Family Medicine

## 2023-07-11 NOTE — Telephone Encounter (Signed)
 Copied from CRM [id number]. Topic: Medical Record Request - Other >> Jul 11, 2023 11:39 AM Maryann Alar wrote: Reason for CRM: patient wanting a phone call to talk about medical paperwork

## 2023-07-14 ENCOUNTER — Ambulatory Visit: Payer: BC Managed Care – PPO | Admitting: Family Medicine

## 2023-07-14 NOTE — Telephone Encounter (Signed)
 Call placed to patient and she states that she had no question regarding her paperwork the matter was handled.

## 2023-07-17 DIAGNOSIS — F411 Generalized anxiety disorder: Secondary | ICD-10-CM | POA: Diagnosis not present

## 2023-07-22 DIAGNOSIS — F411 Generalized anxiety disorder: Secondary | ICD-10-CM | POA: Diagnosis not present

## 2023-07-30 ENCOUNTER — Encounter: Payer: Self-pay | Admitting: Dermatology

## 2023-07-30 ENCOUNTER — Ambulatory Visit: Payer: BC Managed Care – PPO | Admitting: Dermatology

## 2023-07-30 DIAGNOSIS — D239 Other benign neoplasm of skin, unspecified: Secondary | ICD-10-CM

## 2023-07-30 DIAGNOSIS — D2362 Other benign neoplasm of skin of left upper limb, including shoulder: Secondary | ICD-10-CM

## 2023-07-30 DIAGNOSIS — L732 Hidradenitis suppurativa: Secondary | ICD-10-CM

## 2023-07-30 MED ORDER — DOXYCYCLINE HYCLATE 100 MG PO TABS: 100.0000 mg | ORAL_TABLET | Freq: Two times a day (BID) | ORAL | 0 refills | Status: AC

## 2023-07-30 NOTE — Progress Notes (Signed)
 New Patient Visit   Subjective  Kelli Wells is a 38 y.o. female who presents for the following: New Pt - HS  Patient states she has HS located at the under breast & groin area that she would like to have examined. No active flare today. She is here for preventative measures. Patient reports the areas have been there for since she was 42 but was recently Dx last year. She reports the areas are bothersome.Patient rates irritation 10 out of 10. She states that the areas have not spread. Patient reports she has previously been treated for these areas stating that she has had 2 surgeries to help but she still has flare ups here and there. She use clindamycin  solution PRN. Patient denied Hx of bx. Patient denied family history of skin cancer(s).   The following portions of the chart were reviewed this encounter and updated as appropriate: medications, allergies, medical history  Review of Systems:  No other skin or systemic complaints except as noted in HPI or Assessment and Plan.  Objective   A focused examination was performed of the following areas: no flares today  Relevant exam findings are noted in the Assessment and Plan.    Assessment & Plan   HIDRADENITIS SUPPURATIVA Exam: no active flares today  - Assessment: Patient has a history of HS since adolescence, with recent diagnosis confirmation. Flares occur every couple of months, exacerbated by shaving. Primary affected areas include breasts and groin, with less involvement in the armpits. Patient reports a recent decrease in flare frequency, possibly due to hormonal changes (perimenopause symptoms noted). Previous treatments include clindamycin (used only during flares) and oral antibiotics for infected lesions. Surgical intervention was attempted for a breast lesion but resulted in infection. The condition appears to be improving, with the last flare occurring a month ago.  Patient Education Discussed: Hidradenitis  Suppurativa is a chronic; persistent; non-curable, but treatable condition due to abnormal inflamed sweat glands in the body folds (axilla, inframammary, groin, medial thighs), causing recurrent painful draining cysts and scarring. It can be associated with severe scarring acne and cysts; also abscesses and scarring of scalp. The goal is control and prevention of flares, as it is not curable. Scars are permanent and can be thickened. Treatment may include daily use of topical medication and oral antibiotics.  Oral isotretinoin may also be helpful.  For some cases, Humira or Cosentyx (biologic injections) may be prescribed to decrease the inflammatory process and prevent flares.  When indicated, inflamed cysts may also be treated surgically.  Treatment Plan: - Recommended lifestyle changes. No smoking. Dietary changes cutting back dairy, sugars, starches and increase green leafy veggies and dark pigmented fruit. - Rx Doxycycline 100mg  - take BID at the start of a flare up for 1 week. Take with food.  - Continue using Clindamycin solution on affected areas daily.   - Will discuss biologic at [facility] if regimen established today does not prevent flares.  - If condition worsens, consider Bimzelx (interleukin-17 inhibitor) as a second-line treatment    Annual screening for TB and hepatitis if Bimzelx is initiated    Patient educated on potential triggers (smoking, sugar, carbohydrates, dairy) and hormonal influences   DERMATOFIBROMA Exam: Firm pink/brown papulenodule with dimple sign located at the left posterior shoulder  Treatment Plan: A dermatofibroma is a benign growth possibly related to trauma, such as an insect bite, cut from shaving, or inflamed acne-type bump.  Treatment options to remove include shave or excision with resulting  scar and risk of recurrence.  Since benign-appearing and not bothersome, will observe for now.   HIDRADENITIS SUPPURATIVA   Related Medications doxycycline  (VIBRA-TABS) 100 MG tablet Take 1 tablet (100 mg total) by mouth 2 (two) times daily.  No follow-ups on file.    Documentation: I have reviewed the above documentation for accuracy and completeness, and I agree with the above.   I, Shirron Marcha Solders, CMA, am acting as scribe for [employer], DO.   Langston Reusing, DO

## 2023-07-30 NOTE — Patient Instructions (Addendum)
 Hello Kelli Wells,  Thank you for visiting my office today.   Here are the key instructions from today's consultation:  - Continue using clindamycin topically during flare-ups.  - For an Active Flare: Start taking doxycycline 100 mg twice a day at the onset of a tender bump, for up to a week. You may stop earlier if the bump resolves.  - Use benzoyl peroxide wash daily; a recommended product is CeraVe Acne Foaming Face Wash.  - Maintain good hygiene with antibacterial soaps in affected areas.  - Avoid known dietary triggers such as sugars, carbohydrates, and dairy if they worsen your symptoms.  - We have provided a sample of the benzoyl peroxide wash for you to start using immediately.  - Your doxycycline prescription has been sent to your pharmacy. Please ensure to take it with a substantial meal to prevent stomach upset. Contact us if you experience any side effects such as a yeast infection or nausea.  - Follow-up appointment in 4 months to evaluate your progress. If your condition does not improve, we may consider advanced treatment options like Bimzelx.  Thank you once again for your visit today. Please do not hesitate to reach out if you have any questions or concerns regarding your treatment plan.  Warm regards,  Dr. [facility]  Important Information  Due to recent changes in healthcare laws, you may see results of your pathology and/or laboratory studies on MyChart before the doctors have had a chance to review them. We understand that in some cases there may be results that are confusing or concerning to you. Please understand that not all results are received at the same time and often the doctors may need to interpret multiple results in order to provide you with the best plan of care or course of treatment. Therefore, we ask that you please give [place] 2 business days to thoroughly review all your results before contacting the office for clarification. Should we  see a critical lab result, you will be contacted sooner.   If You Need Anything After Your Visit  If you have any questions or concerns for your doctor, please call our main line at [phone] If no one answers, please leave a voicemail as directed and we will return your call as soon as possible. Messages left after 4 pm will be answered the following business day.   You may also send [place] a message via MyChart. We typically respond to MyChart messages within 1-2 business days.  For prescription refills, please ask your pharmacy to contact our office. Our fax number is [phone].  If you have an urgent issue when the clinic is closed that cannot wait until the next business day, you can page your doctor at the number below.    Please note that while we do our best to be available for urgent issues outside of office hours, we are not available 24/7.   If you have an urgent issue and are unable to reach [place], you may choose to seek medical care at your doctor's office, retail clinic, urgent care center, or emergency room.  If you have a medical emergency, please immediately call 911 or go to the emergency department. In the event of inclement weather, please call our main line at [phone] for an update on the status of any delays or closures.  Dermatology Medication Tips: Please keep the boxes that topical medications come in in order to help keep track of the instructions about where and how to use  these. Pharmacies typically print the medication instructions only on the boxes and not directly on the medication tubes.   If your medication is too expensive, please contact our office at [phone] or send [place] a message through MyChart.   We are unable to tell what your co-pay for medications will be in advance as this is different depending on your insurance coverage. However, we may be able to find a substitute medication at lower cost or fill out paperwork to get insurance to cover a  needed medication.   If a prior authorization is required to get your medication covered by your insurance company, please allow [place] 1-2 business days to complete this process.  Drug prices often vary depending on where the prescription is filled and some pharmacies may offer cheaper prices.  The website www.goodrx.com contains coupons for medications through different pharmacies. The prices here do not account for what the cost may be with help from insurance (it may be cheaper with your insurance), but the website can give you the price if you did not use any insurance.  - You can print the associated coupon and take it with your prescription to the pharmacy.  - You may also stop by our office during regular business hours and pick up a GoodRx coupon card.  - If you need your prescription sent electronically to a different pharmacy, notify our office through [facility] or by phone at [phone]

## 2023-08-30 ENCOUNTER — Encounter: Payer: Self-pay | Admitting: Family Medicine

## 2023-09-01 ENCOUNTER — Other Ambulatory Visit: Payer: Self-pay | Admitting: Family Medicine

## 2023-09-01 MED ORDER — METRONIDAZOLE 500 MG PO TABS: 500.0000 mg | ORAL_TABLET | Freq: Two times a day (BID) | ORAL | 0 refills | Status: AC

## 2023-09-01 NOTE — Telephone Encounter (Signed)
 Will forward to provider

## 2023-09-22 ENCOUNTER — Encounter: Payer: Self-pay | Admitting: Dermatology

## 2023-09-26 DIAGNOSIS — F411 Generalized anxiety disorder: Secondary | ICD-10-CM | POA: Diagnosis not present

## 2023-10-17 ENCOUNTER — Encounter: Payer: Self-pay | Admitting: Family Medicine

## 2023-10-17 ENCOUNTER — Other Ambulatory Visit: Payer: Self-pay | Admitting: Family Medicine

## 2023-10-17 DIAGNOSIS — B3731 Acute candidiasis of vulva and vagina: Secondary | ICD-10-CM

## 2023-10-27 ENCOUNTER — Ambulatory Visit: Admitting: Dermatology

## 2023-10-29 DIAGNOSIS — Z114 Encounter for screening for human immunodeficiency virus [HIV]: Secondary | ICD-10-CM | POA: Diagnosis not present

## 2023-10-29 DIAGNOSIS — L732 Hidradenitis suppurativa: Secondary | ICD-10-CM | POA: Diagnosis not present

## 2023-10-29 DIAGNOSIS — Z113 Encounter for screening for infections with a predominantly sexual mode of transmission: Secondary | ICD-10-CM | POA: Diagnosis not present

## 2023-10-29 DIAGNOSIS — Z01419 Encounter for gynecological examination (general) (routine) without abnormal findings: Secondary | ICD-10-CM | POA: Diagnosis not present

## 2023-10-29 DIAGNOSIS — Z3046 Encounter for surveillance of implantable subdermal contraceptive: Secondary | ICD-10-CM | POA: Diagnosis not present

## 2023-10-29 DIAGNOSIS — F411 Generalized anxiety disorder: Secondary | ICD-10-CM | POA: Diagnosis not present

## 2023-11-05 ENCOUNTER — Other Ambulatory Visit: Payer: Self-pay | Admitting: Physician Assistant

## 2023-11-05 DIAGNOSIS — E559 Vitamin D deficiency, unspecified: Secondary | ICD-10-CM

## 2023-11-14 ENCOUNTER — Telehealth: Admitting: Physician Assistant

## 2023-11-14 DIAGNOSIS — B3731 Acute candidiasis of vulva and vagina: Secondary | ICD-10-CM

## 2023-11-14 MED ORDER — FLUCONAZOLE 150 MG PO TABS: 150.0000 mg | ORAL_TABLET | ORAL | 0 refills | Status: DC | PRN

## 2023-11-14 NOTE — Patient Instructions (Signed)
 Kelli Wells, thank you for joining Delon CHRISTELLA Dickinson, PA-C for today's virtual visit.  While this provider is not your primary care provider (PCP), if your PCP is located in our provider database this encounter information will be shared with them immediately following your visit.   A [place] MyChart account gives you access to today's visit and all your visits, tests, and labs performed at [facility]  click here if you don't have a [place] MyChart account or go to [email or web address]  Consent: (Patient) Kelli Wells provided verbal consent for this virtual visit at the beginning of the encounter.  Current Medications:  Current Outpatient Medications:    fluconazole  (DIFLUCAN ) 150 MG tablet, Take 1 tablet (150 mg total) by mouth every 3 (three) days as needed., Disp: 2 tablet, Rfl: 0   albuterol  (VENTOLIN  HFA) 108 (90 Base) MCG/ACT inhaler, Inhale 1-2 puffs into the lungs every 6 (six) hours as needed for wheezing or shortness of breath., Disp: 18 g, Rfl: 6   amLODipine  (NORVASC ) 10 MG tablet, Take 1 tablet (10 mg total) by mouth daily., Disp: 90 tablet, Rfl: 1   amphetamine-dextroamphetamine (ADDERALL) 30 MG tablet, Take 1 tablet by mouth daily., Disp: , Rfl:    Blood Pressure Monitoring (BLOOD PRESSURE CUFF) MISC, Use to check blood pressure once daily., Disp: 1 each, Rfl: 0   buPROPion  (WELLBUTRIN  XL) 300 MG 24 hr tablet, Take 1 tablet (300 mg total) by mouth every morning., Disp: 30 tablet, Rfl: 0   carvedilol  (COREG ) 25 MG tablet, Take 1 tablet (25 mg total) by mouth 2 (two) times daily with a meal., Disp: 180 tablet, Rfl: 1   clindamycin  (CLEOCIN  T) 1 % external solution, Apply topically 2 (two) times daily. For hidradenitis, Disp: 60 mL, Rfl: 1   clonazePAM  (KLONOPIN ) 0.5 MG tablet, Take 0.5 mg by mouth daily., Disp: , Rfl:    cloNIDine HCl (KAPVAY) 0.1 MG TB12 ER tablet, Take 0.1 mg by mouth daily., Disp: , Rfl:    EPINEPHrine  (EPIPEN   2-PAK) 0.3 mg/0.3 mL IJ SOAJ injection, Inject 0.3 mLs (0.3 mg total) into the muscle as needed for anaphylaxis., Disp: 1 each, Rfl: 1   fluticasone  (FLONASE ) 50 MCG/ACT nasal spray, Place 2 sprays into both nostrils daily., Disp: 16 g, Rfl: 1   hydrOXYzine  (ATARAX ) 10 MG tablet, Take 10 mg by mouth 3 (three) times daily., Disp: , Rfl:    hydrOXYzine  (ATARAX ) 25 MG tablet, Take 1 tablet (25 mg total) by mouth 3 (three) times daily as needed., Disp: 90 tablet, Rfl: 0   ipratropium-albuterol  (DUONEB) 0.5-2.5 (3) MG/3ML SOLN, USE 1 AMPULE IN NEBULIZER VIA NEBULIZATION EVERY 6 HOURS AS NEEDED, Disp: 360 mL, Rfl: 0   ipratropium-albuterol  (DUONEB) 0.5-2.5 (3) MG/3ML SOLN, Take 3 mLs by nebulization every 6 (six) hours as needed., Disp: 120 mL, Rfl: 0   loratadine  (CLARITIN ) 10 MG tablet, Take 1 tablet (10 mg total) by mouth daily., Disp: 30 tablet, Rfl: 1   methocarbamol  (ROBAXIN ) 500 MG tablet, Take 1 tablet (500 mg total) by mouth every 8 (eight) hours as needed for muscle spasms., Disp: 90 tablet, Rfl: 1   naproxen  (NAPROSYN ) 500 MG tablet, Take 1 tablet (500 mg total) by mouth 2 (two) times daily with a meal., Disp: 60 tablet, Rfl: 6   sertraline  (ZOLOFT ) 100 MG tablet, Take 1.5 tablets (150 mg total) by mouth daily., Disp: 45 tablet, Rfl: 0   traZODone  (DESYREL ) 50 MG tablet, Take 1 tablet (50 mg total) by  mouth at bedtime as needed for sleep., Disp: 30 tablet, Rfl: 6   Vitamin D , Ergocalciferol , (DRISDOL ) 1.25 MG (50000 UNIT) CAPS capsule, Take 1 capsule by mouth once a week, Disp: 4 capsule, Rfl: 0   Medications ordered in this encounter:  Meds ordered this encounter  Medications   fluconazole  (DIFLUCAN ) 150 MG tablet    Sig: Take 1 tablet (150 mg total) by mouth every 3 (three) days as needed.    Dispense:  2 tablet    Refill:  0    Supervising Provider:   LAMPTEY, PHILIP O [[id number]]     *If you need refills on other medications prior to your next appointment, please contact your  pharmacy*  Follow-Up: Call back or seek an in-person evaluation if the symptoms worsen or if the condition fails to improve as anticipated.  [facility] [phone]  Other Instructions Vaginal Probiotics: AZO vaginal probiotic OLLY Happy Hoo-Ha RAW Vaginal Care RenewLife Women's vaginal probiotic RepHresh Pro-B  Vaginal washes: Honey Pot Summer's Eve Vagisil Feminine cleanser  Boric Acid Suppositories  Healthy vaginal hygiene practices    -  Avoid sleeper pajamas. Nightgowns allow air to circulate.  Sleep without underpants whenever possible.   -  Wear cotton underpants during the day. Double-rinse underwear after washing to avoid residual irritants. Do not use fabric softeners for underwear and swimsuits.   - Avoid tights, leotards, leggings, skinny jeans, and other tight-fitting clothing. Skirts and loose-fitting pants allow air to circulate.   - Avoid pantyliners.  Instead use tampons or cotton pads.   - Use the restroom after intercourse to help prevent UTI's   - Daily warm bathing is helpful:     - Soak in clean water (no soap) for 10 to 15 minutes. Adding vinegar or baking soda to the water has not been specifically studied and may not be better than clean water alone.      - Use soap to wash regions other than the genital area just before getting out of the tub. Limit use of any soap on genital areas. Use fragance-free soaps.     - Rinse the genital area well and gently pat dry.  Don't rub.  Hair dryer to assist with drying can be used only if on cool setting.     - Do not use bubble baths or perfumed soaps.   - Do not use any feminine sprays, douches or powders.  These contain chemicals that will irritate the skin.   - If the genital area is tender or swollen, cool compresses may relieve the discomfort. Unscented wet wipes can be used instead of toilet paper for wiping.    - Emollients, such as Vaseline, may help protect skin and can be applied to  the irritated area.   - Always remember to wipe front-to-back after bowel movements. Pat dry after urination.   - Do not sit in wet swimsuits for long periods of time after swimming    If you have been instructed to have an in-person evaluation today at a local Urgent Care facility, please use the link below. It will take you to a list of all of our available [facility], including address, phone number and hours of operation. Please do not delay care.  [facility]  If you or a family member do not have a primary care provider, use the link below to schedule a visit and establish care. When you choose a  primary care physician  or advanced practice provider, you gain a long-term partner in health. Find a Primary Care Provider  Learn more about [place] [facility] in-office and virtual care options: [place] - Get Care Now

## 2023-11-14 NOTE — Progress Notes (Signed)
 Virtual Visit Consent   Kelli Wells, you are scheduled for a virtual visit with a [facility] provider today. Just as with appointments in the office, your consent must be obtained to participate. Your consent will be active for this visit and any virtual visit you may have with one of our providers in the next 365 days. If you have a MyChart account, a copy of this consent can be sent to you electronically.  As this is a virtual visit, video technology does not allow for your provider to perform a traditional examination. This may limit your provider's ability to fully assess your condition. If your provider identifies any concerns that need to be evaluated in person or the need to arrange testing (such as labs, EKG, etc.), we will make arrangements to do so. Although advances in technology are sophisticated, we cannot ensure that it will always work on either your end or our end. If the connection with a video visit is poor, the visit may have to be switched to a telephone visit. With either a video or telephone visit, we are not always able to ensure that we have a secure connection.  By engaging in this virtual visit, you consent to the provision of healthcare and authorize for your insurance to be billed (if applicable) for the services provided during this visit. Depending on your insurance coverage, you may receive a charge related to this service.  I need to obtain your verbal consent now. Are you willing to proceed with your visit today? [name] has provided verbal consent on 11/14/2023 for a virtual visit (video or telephone). Delon CHRISTELLA Dickinson, PA-C  Date: 11/14/2023 10:38 AM   Virtual Visit via Video Note   I, Delon CHRISTELLA Dickinson, connected with  Kelli Wells  ([id number], [birth year]) on 11/14/23 at 10:45 AM EDT by a video-enabled telemedicine application and verified that I am speaking with the correct person using two identifiers.  Location: Patient:  Virtual Visit Location Patient: Home Provider: Virtual Visit Location Provider: Home Office   I discussed the limitations of evaluation and management by telemedicine and the availability of in person appointments. The patient expressed understanding and agreed to proceed.    History of Present Illness: Kelli Wells is a 38 y.o. who identifies as a female who was assigned female at birth, and is being seen today for vaginal irritation.  HPI: Vaginal Itching The patient's primary symptoms include genital itching. The patient's pertinent negatives include no vaginal discharge. This is a new problem. The current episode started today. The problem occurs constantly. The problem has been gradually worsening. The patient is experiencing no pain. Pertinent negatives include no abdominal pain, back pain, chills, dysuria, flank pain, frequency, headaches or nausea. The symptoms are aggravated by tactile pressure. She has tried nothing for the symptoms. The treatment provided no relief. She is sexually active. No, her partner does not have an STD.     Problems:  Patient Active Problem List   Diagnosis Date Noted   Suspected sleep apnea 02/27/2023   Mild intermittent asthma 01/08/2022   Chronic allergic conjunctivitis 01/08/2022   Allergic rhinitis due to pollen 01/08/2022   Allergic rhinitis due to animal (cat) (dog) hair and dander 01/08/2022   Allergic rhinitis 01/08/2022   Adverse reaction to food 01/08/2022   Carrier of group B Streptococcus 01/08/2022   Vitamin D  deficiency 12/21/2021   Stress 01/10/2021   Psychophysiological insomnia 01/10/2021   Abnormal cervical Papanicolaou smear 01/09/2021  Asthma    Cholecystitis with cholelithiasis 12/17/2014   Non-reactive NST (non-stress test)    Postpartum depression 02/02/2013   Esophageal reflux 02/02/2013   Hypertensive disorder 05/02/2011   Anxiety state 05/02/2011   Attention deficit hyperactivity disorder (ADHD) 05/02/2011     Allergies:  Allergies  Allergen Reactions   Other Other (See Comments)   Shellfish Allergy Swelling    Facial swelling    Shrimp (Diagnostic) Anxiety, Hives, Itching and Swelling   Medications:  Current Outpatient Medications:    fluconazole  (DIFLUCAN ) 150 MG tablet, Take 1 tablet (150 mg total) by mouth every 3 (three) days as needed., Disp: 2 tablet, Rfl: 0   albuterol  (VENTOLIN  HFA) 108 (90 Base) MCG/ACT inhaler, Inhale 1-2 puffs into the lungs every 6 (six) hours as needed for wheezing or shortness of breath., Disp: 18 g, Rfl: 6   amLODipine  (NORVASC ) 10 MG tablet, Take 1 tablet (10 mg total) by mouth daily., Disp: 90 tablet, Rfl: 1   amphetamine-dextroamphetamine (ADDERALL) 30 MG tablet, Take 1 tablet by mouth daily., Disp: , Rfl:    Blood Pressure Monitoring (BLOOD PRESSURE CUFF) MISC, Use to check blood pressure once daily., Disp: 1 each, Rfl: 0   buPROPion  (WELLBUTRIN  XL) 300 MG 24 hr tablet, Take 1 tablet (300 mg total) by mouth every morning., Disp: 30 tablet, Rfl: 0   carvedilol  (COREG ) 25 MG tablet, Take 1 tablet (25 mg total) by mouth 2 (two) times daily with a meal., Disp: 180 tablet, Rfl: 1   clindamycin  (CLEOCIN  T) 1 % external solution, Apply topically 2 (two) times daily. For hidradenitis, Disp: 60 mL, Rfl: 1   clonazePAM  (KLONOPIN ) 0.5 MG tablet, Take 0.5 mg by mouth daily., Disp: , Rfl:    cloNIDine HCl (KAPVAY) 0.1 MG TB12 ER tablet, Take 0.1 mg by mouth daily., Disp: , Rfl:    EPINEPHrine  (EPIPEN  2-PAK) 0.3 mg/0.3 mL IJ SOAJ injection, Inject 0.3 mLs (0.3 mg total) into the muscle as needed for anaphylaxis., Disp: 1 each, Rfl: 1   fluticasone  (FLONASE ) 50 MCG/ACT nasal spray, Place 2 sprays into both nostrils daily., Disp: 16 g, Rfl: 1   hydrOXYzine  (ATARAX ) 10 MG tablet, Take 10 mg by mouth 3 (three) times daily., Disp: , Rfl:    hydrOXYzine  (ATARAX ) 25 MG tablet, Take 1 tablet (25 mg total) by mouth 3 (three) times daily as needed., Disp: 90 tablet, Rfl: 0    ipratropium-albuterol  (DUONEB) 0.5-2.5 (3) MG/3ML SOLN, USE 1 AMPULE IN NEBULIZER VIA NEBULIZATION EVERY 6 HOURS AS NEEDED, Disp: 360 mL, Rfl: 0   ipratropium-albuterol  (DUONEB) 0.5-2.5 (3) MG/3ML SOLN, Take 3 mLs by nebulization every 6 (six) hours as needed., Disp: 120 mL, Rfl: 0   loratadine  (CLARITIN ) 10 MG tablet, Take 1 tablet (10 mg total) by mouth daily., Disp: 30 tablet, Rfl: 1   methocarbamol  (ROBAXIN ) 500 MG tablet, Take 1 tablet (500 mg total) by mouth every 8 (eight) hours as needed for muscle spasms., Disp: 90 tablet, Rfl: 1   naproxen  (NAPROSYN ) 500 MG tablet, Take 1 tablet (500 mg total) by mouth 2 (two) times daily with a meal., Disp: 60 tablet, Rfl: 6   sertraline  (ZOLOFT ) 100 MG tablet, Take 1.5 tablets (150 mg total) by mouth daily., Disp: 45 tablet, Rfl: 0   traZODone  (DESYREL ) 50 MG tablet, Take 1 tablet (50 mg total) by mouth at bedtime as needed for sleep., Disp: 30 tablet, Rfl: 6   Vitamin D , Ergocalciferol , (DRISDOL ) 1.25 MG (50000 UNIT) CAPS capsule, Take 1 capsule  by mouth once a week, Disp: 4 capsule, Rfl: 0  Observations/Objective: Patient is well-developed, well-nourished in no acute distress.  Resting comfortably at home.  Head is normocephalic, atraumatic.  No labored breathing.  Speech is clear and coherent with logical content.  Patient is alert and oriented at baseline.    Assessment and Plan: 1. Yeast vaginitis (Primary) - fluconazole  (DIFLUCAN ) 150 MG tablet; Take 1 tablet (150 mg total) by mouth every 3 (three) days as needed.  Dispense: 2 tablet; Refill: 0  - Symptoms consistent with yeast vaginitis - Fluconazole  prescribed - Limit bubble baths, scented lotions/soaps/detergents - Limit tight fitting clothing - Seek on person evaluation if not improving or if symptoms worsen   Follow Up Instructions: I discussed the assessment and treatment plan with the patient. The patient was provided an opportunity to ask questions and all were answered. The  patient agreed with the plan and demonstrated an understanding of the instructions.  A copy of instructions were sent to the patient via MyChart unless otherwise noted below.    The patient was advised to call back or seek an in-person evaluation if the symptoms worsen or if the condition fails to improve as anticipated.    Delon CHRISTELLA Dickinson, PA-C

## 2023-11-26 ENCOUNTER — Ambulatory Visit: Payer: BC Managed Care – PPO | Admitting: Family Medicine

## 2023-11-26 ENCOUNTER — Ambulatory Visit: Admitting: Nurse Practitioner

## 2023-11-26 DIAGNOSIS — F411 Generalized anxiety disorder: Secondary | ICD-10-CM | POA: Diagnosis not present

## 2023-11-28 ENCOUNTER — Other Ambulatory Visit: Payer: Self-pay | Admitting: Family Medicine

## 2023-11-28 DIAGNOSIS — E559 Vitamin D deficiency, unspecified: Secondary | ICD-10-CM

## 2023-12-04 ENCOUNTER — Ambulatory Visit: Admitting: Family Medicine

## 2023-12-04 ENCOUNTER — Telehealth: Payer: Self-pay | Admitting: Family Medicine

## 2023-12-04 NOTE — Telephone Encounter (Signed)
 Pt had to resch appt confirmed next week appt

## 2023-12-08 ENCOUNTER — Ambulatory Visit: Admitting: Family Medicine

## 2023-12-11 ENCOUNTER — Other Ambulatory Visit: Payer: Self-pay

## 2023-12-11 ENCOUNTER — Ambulatory Visit: Admitting: Family Medicine

## 2023-12-16 ENCOUNTER — Telehealth: Payer: Self-pay | Admitting: Family Medicine

## 2023-12-16 NOTE — Procedures (Signed)
 SABRA

## 2023-12-16 NOTE — Procedures (Signed)
 Kelli Wells

## 2023-12-16 NOTE — Telephone Encounter (Signed)
 Pt confirmed appt

## 2023-12-17 ENCOUNTER — Ambulatory Visit: Attending: Family Medicine | Admitting: Family Medicine

## 2023-12-17 ENCOUNTER — Encounter: Payer: Self-pay | Admitting: Family Medicine

## 2023-12-17 VITALS — BP 143/92 | HR 71 | Ht 62.0 in | Wt 187.6 lb

## 2023-12-17 DIAGNOSIS — I10 Essential (primary) hypertension: Secondary | ICD-10-CM | POA: Diagnosis not present

## 2023-12-17 DIAGNOSIS — F331 Major depressive disorder, recurrent, moderate: Secondary | ICD-10-CM | POA: Diagnosis not present

## 2023-12-17 DIAGNOSIS — F419 Anxiety disorder, unspecified: Secondary | ICD-10-CM

## 2023-12-17 DIAGNOSIS — F428 Other obsessive-compulsive disorder: Secondary | ICD-10-CM

## 2023-12-17 DIAGNOSIS — K529 Noninfective gastroenteritis and colitis, unspecified: Secondary | ICD-10-CM

## 2023-12-17 DIAGNOSIS — F32A Depression, unspecified: Secondary | ICD-10-CM

## 2023-12-17 DIAGNOSIS — F909 Attention-deficit hyperactivity disorder, unspecified type: Secondary | ICD-10-CM | POA: Diagnosis not present

## 2023-12-17 DIAGNOSIS — F411 Generalized anxiety disorder: Secondary | ICD-10-CM | POA: Diagnosis not present

## 2023-12-17 DIAGNOSIS — E559 Vitamin D deficiency, unspecified: Secondary | ICD-10-CM | POA: Diagnosis not present

## 2023-12-17 MED ORDER — HYDROCHLOROTHIAZIDE 12.5 MG PO TABS
12.5000 mg | ORAL_TABLET | Freq: Every day | ORAL | 1 refills | Status: AC
Start: 2023-12-17 — End: ?

## 2023-12-17 MED ORDER — CARVEDILOL 25 MG PO TABS: 25.0000 mg | ORAL_TABLET | Freq: Two times a day (BID) | ORAL | 1 refills | Status: AC

## 2023-12-17 MED ORDER — AMLODIPINE BESYLATE 10 MG PO TABS: 10.0000 mg | ORAL_TABLET | Freq: Every day | ORAL | 1 refills | Status: DC

## 2023-12-17 MED ORDER — VITAMIN D (ERGOCALCIFEROL) 1.25 MG (50000 UNIT) PO CAPS: 50000.0000 [IU] | ORAL_CAPSULE | ORAL | 1 refills | Status: AC

## 2023-12-17 NOTE — Progress Notes (Signed)
 Subjective:  Patient ID: Kelli Wells, female    DOB: [birth year]  Age: 38 y.o. MRN: [id number]  CC: Medical Management of Chronic Issues (Abdominal concerns/)     Discussed the use of AI scribe software for clinical note transcription with the patient, who gave verbal consent to proceed.  History of Present Illness Kelli Wells is a 38 year old female with  a history of hypertension, anxiety and depression, hidradenitis, prediabetes, nicotine dependence, asthma. who presents with chronic diarrhea and elevated blood pressure.  Chronic diarrhea has persisted since gallbladder removal in 2015 or 2016, with every meal resulting in diarrhea and occasional pre-bowel movement spasms. The diarrhea is foul-smelling. There is no nausea or vomiting. The gallbladder was removed due to gallstones.  Hypertension is managed with amlodipine  and carvedilol , with home blood pressure readings around 135/80 mmHg. A recent reading was 153/95 on initial intake.  She smoked a cigarette before the appointment.  She is treated for anxiety and depression with Zoloft , trazodone , bupropion , and Adderall. Trazodone  causes drowsiness. Vitamin D  supplementation has improved her well-being, with previously low levels noted in January.  She smokes cigarettes and is not planning to quit. She has a habit of nail picking due to her anxiety. She is currently unemployed and seeking employment.    Past Medical History:  Diagnosis Date   Anxiety    Asthma    Chronic hypertension in pregnancy    Depression    GERD (gastroesophageal reflux disease)    Hypertension    Infection    UTI   Pregnancy induced hypertension    Vaginal Pap smear, abnormal    LEEP, normal since    Past Surgical History:  Procedure Laterality Date   CHOLECYSTECTOMY N/A 12/18/2014   Procedure: LAPAROSCOPIC CHOLECYSTECTOMY WITH INTRAOPERATIVE CHOLANGIOGRAM;  Surgeon: Deward Null III, MD;  Location: [facility] OR;  Service: General;   Laterality: N/A;   LEEP     LYMPH GLAND EXCISION      Family History  Problem Relation Age of Onset   Hypertension Maternal Grandmother    Cancer Maternal Grandmother        breast   Hypertension Maternal Grandfather    Heart attack Maternal Grandfather    Kidney disease Paternal Grandmother    Heart disease Paternal Grandfather    Hypertension Mother    Cancer Mother        rectal   Hearing loss Neg Hx     Social History   Socioeconomic History   Marital status: Significant Other    Spouse name: Not on file   Number of children: Not on file   Years of education: Not on file   Highest education level: Some college, no degree  Occupational History   Not on file  Tobacco Use   Smoking status: Every Day    Current packs/day: 0.25    Types: Cigarettes   Smokeless tobacco: Never  Vaping Use   Vaping status: Never Used  Substance and Sexual Activity   Alcohol use: Yes    Alcohol/week: 2.0 - 3.0 standard drinks of alcohol    Types: 2 - 3 Glasses of wine per week    Comment: occas   Drug use: No   Sexual activity: Yes    Birth control/protection: Implant  Other Topics Concern   Not on file  Social History Narrative   ** Merged History Encounter **       Social Drivers of Corporate investment banker Strain: Not on file  Food Insecurity: Not on file  Transportation Needs: No Transportation Needs (11/16/2019)   PRAPARE - Administrator, Civil Service (Medical): No    Lack of Transportation (Non-Medical): No  Physical Activity: Not on file  Stress: Stress Concern Present (11/19/2021)   [employer] of Occupational Health - Occupational Stress Questionnaire    Feeling of Stress : Very much  Social Connections: Unknown (09/10/2021)   [facility]   Social Network    Social Network: Not on file    Allergies  Allergen Reactions   Other Other (See Comments)   Shellfish Allergy Swelling    Facial swelling    Shrimp (Diagnostic)  Anxiety, Hives, Itching and Swelling    Outpatient Medications Prior to Visit  Medication Sig Dispense Refill   albuterol  (VENTOLIN  HFA) 108 (90 Base) MCG/ACT inhaler Inhale 1-2 puffs into the lungs every 6 (six) hours as needed for wheezing or shortness of breath. 18 g 6   amphetamine-dextroamphetamine (ADDERALL) 30 MG tablet Take 1 tablet by mouth daily.     Blood Pressure Monitoring (BLOOD PRESSURE CUFF) MISC Use to check blood pressure once daily. 1 each 0   buPROPion  (WELLBUTRIN  XL) 300 MG 24 hr tablet Take 1 tablet (300 mg total) by mouth every morning. 30 tablet 0   clindamycin  (CLEOCIN  T) 1 % external solution Apply topically 2 (two) times daily. For hidradenitis 60 mL 1   clonazePAM  (KLONOPIN ) 0.5 MG tablet Take 0.5 mg by mouth daily.     cloNIDine HCl (KAPVAY) 0.1 MG TB12 ER tablet Take 0.1 mg by mouth daily.     EPINEPHrine  (EPIPEN  2-PAK) 0.3 mg/0.3 mL IJ SOAJ injection Inject 0.3 mLs (0.3 mg total) into the muscle as needed for anaphylaxis. 1 each 1   fluconazole  (DIFLUCAN ) 150 MG tablet Take 1 tablet (150 mg total) by mouth every 3 (three) days as needed. 2 tablet 0   fluticasone  (FLONASE ) 50 MCG/ACT nasal spray Place 2 sprays into both nostrils daily. 16 g 1   hydrOXYzine  (ATARAX ) 10 MG tablet Take 10 mg by mouth 3 (three) times daily.     hydrOXYzine  (ATARAX ) 25 MG tablet Take 1 tablet (25 mg total) by mouth 3 (three) times daily as needed. 90 tablet 0   ipratropium-albuterol  (DUONEB) 0.5-2.5 (3) MG/3ML SOLN USE 1 AMPULE IN NEBULIZER VIA NEBULIZATION EVERY 6 HOURS AS NEEDED 360 mL 0   ipratropium-albuterol  (DUONEB) 0.5-2.5 (3) MG/3ML SOLN Take 3 mLs by nebulization every 6 (six) hours as needed. 120 mL 0   loratadine  (CLARITIN ) 10 MG tablet Take 1 tablet (10 mg total) by mouth daily. 30 tablet 1   methocarbamol  (ROBAXIN ) 500 MG tablet Take 1 tablet (500 mg total) by mouth every 8 (eight) hours as needed for muscle spasms. 90 tablet 1   naproxen  (NAPROSYN ) 500 MG tablet Take 1  tablet (500 mg total) by mouth 2 (two) times daily with a meal. 60 tablet 6   sertraline  (ZOLOFT ) 100 MG tablet Take 1.5 tablets (150 mg total) by mouth daily. 45 tablet 0   traZODone  (DESYREL ) 50 MG tablet Take 1 tablet (50 mg total) by mouth at bedtime as needed for sleep. 30 tablet 6   amLODipine  (NORVASC ) 10 MG tablet Take 1 tablet (10 mg total) by mouth daily. 90 tablet 1   carvedilol  (COREG ) 25 MG tablet Take 1 tablet (25 mg total) by mouth 2 (two) times daily with a meal. 180 tablet 1   Vitamin D , Ergocalciferol , (DRISDOL ) 1.25 MG (50000  UNIT) CAPS capsule Take 1 capsule by mouth once a week 4 capsule 0   No facility-administered medications prior to visit.     ROS Review of Systems  Constitutional:  Negative for activity change and appetite change.  HENT:  Negative for sinus pressure and sore throat.   Respiratory:  Negative for chest tightness, shortness of breath and wheezing.   Cardiovascular:  Negative for chest pain and palpitations.  Gastrointestinal:  Negative for abdominal distention, abdominal pain and constipation.  Genitourinary: Negative.   Musculoskeletal: Negative.   Psychiatric/Behavioral:  Negative for behavioral problems and dysphoric mood.     Objective:  BP (!) 143/92   Pulse 71   Ht 5' 2 (1.575 m)   Wt 187 lb 9.6 oz (85.1 kg)   SpO2 97%   BMI 34.31 kg/m      12/17/2023    9:34 AM 12/17/2023    8:57 AM 05/29/2023    4:45 PM  BP/Weight  Systolic BP 143 153 150  Diastolic BP 92 95 76  Wt. (Lbs)  187.6   BMI  34.31 kg/m2       Physical Exam Constitutional:      Appearance: She is well-developed.  Cardiovascular:     Rate and Rhythm: Normal rate.     Heart sounds: Normal heart sounds. No murmur heard. Pulmonary:     Effort: Pulmonary effort is normal.     Breath sounds: Normal breath sounds. No wheezing or rales.  Chest:     Chest wall: No tenderness.  Abdominal:     General: Bowel sounds are normal. There is no distension.     Palpations:  Abdomen is soft. There is no mass.     Tenderness: There is no abdominal tenderness.  Musculoskeletal:        General: Normal range of motion.     Right lower leg: No edema.     Left lower leg: No edema.  Neurological:     Mental Status: She is alert and oriented to person, place, and time.  Psychiatric:        Mood and Affect: Mood normal.        Latest Ref Rng & Units 05/29/2023    5:05 PM 05/23/2021   10:33 AM 08/02/2020   10:01 AM  CMP  Glucose 70 - 99 mg/dL 94  88  895   BUN 6 - 20 mg/dL 6  9  8    Creatinine 0.57 - 1.00 mg/dL 9.10  9.23  9.28   Sodium 134 - 144 mmol/L 138  140  140   Potassium 3.5 - 5.2 mmol/L 4.5  4.1  4.3   Chloride 96 - 106 mmol/L 105  104  104   CO2 20 - 29 mmol/L 20  20  19    Calcium  8.7 - 10.2 mg/dL 9.4  9.3  9.6   Total Protein 6.0 - 8.5 g/dL 6.8  6.5    Total Bilirubin 0.0 - 1.2 mg/dL <9.7  <9.7    Alkaline Phos 44 - 121 IU/L 123  109    AST 0 - 40 IU/L 16  12    ALT 0 - 32 IU/L 16  16      Lipid Panel     Component Value Date/Time   CHOL 157 07/15/2022 0934   TRIG 108 07/15/2022 0934   HDL 52 07/15/2022 0934   CHOLHDL 3.1 09/26/2017 0906   CHOLHDL 3.4 01/01/2016 1020   VLDL 10 01/01/2016 1020   LDLCALC 85  07/15/2022 0934    CBC    Component Value Date/Time   WBC 8.6 05/29/2023 1705   WBC 6.9 09/01/2017 1722   RBC 5.22 05/29/2023 1705   RBC 4.54 09/01/2017 1722   HGB 15.0 05/29/2023 1705   HCT 46.1 05/29/2023 1705   PLT 411 05/29/2023 1705   MCV 88 05/29/2023 1705   MCV 88 03/22/2013 0320   MCH 28.7 05/29/2023 1705   MCH 29.1 09/01/2017 1722   MCHC 32.5 05/29/2023 1705   MCHC 33.6 09/01/2017 1722   RDW 12.8 05/29/2023 1705   RDW 12.8 03/22/2013 0320   LYMPHSABS 4.1 (H) 05/29/2023 1705   EOSABS 0.4 05/29/2023 1705   BASOSABS 0.0 05/29/2023 1705    Lab Results  Component Value Date   HGBA1C 5.6 05/29/2023       Assessment & Plan 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 - Refer to gastroenterologist for further evaluation and management.  Hypertension Blood pressure elevated initially and on repeat is 143/92 today, with home readings around 135/80. Recent smoking and Adderall use may contribute to elevated readings. - Prescribe hydrochlorothiazide  to manage elevated blood pressure. - Advise to avoid smoking and caffeine before appointments to prevent elevated readings. - Send refills for amlodipine  and Coreg  -Counseled on blood pressure goal of less than 130/80, low-sodium, DASH diet, medication compliance, 150 minutes of moderate intensity exercise per week. Discussed medication compliance, adverse effects.   Depression and anxiety disorder Continues management with behavioral health. Current medications include Zoloft , Wellbutrin , and Adderall. Trazodone  used as needed due to drowsiness. - Provide documentation of today's blood pressure reading for behavioral health provider.  Vitamin D  deficiency Vitamin D  was very low in January. She reports improvement with vitamin D  supplements. - Check vitamin D  levels. - Send refill for vitamin D .  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 - Recommend applying nail polish or artificial nails to prevent nail picking.   HCM - States she is up-to-date with cervical cancer screening at the health department - Will obtain records  Meds ordered this encounter  Medications   amLODipine  (NORVASC ) 10 MG tablet    Sig: Take 1 tablet (10 mg total) by mouth daily.    Dispense:  90 tablet    Refill:  1   carvedilol  (COREG ) 25 MG tablet    Sig: Take 1 tablet (25 mg total) by mouth 2 (two) times daily with a meal.    Dispense:  180 tablet    Refill:  1   Vitamin D , Ergocalciferol , (DRISDOL ) 1.25 MG (50000 UNIT) CAPS capsule    Sig: Take 1 capsule (50,000 Units total) by mouth once  a week.    Dispense:  12 capsule    Refill:  1   hydrochlorothiazide  (HYDRODIURIL ) 12.5 MG tablet    Sig: Take 1 tablet (12.5 mg total) by mouth daily.    Dispense:  90 tablet    Refill:  1    Follow-up: No follow-ups on file.       Corrina Sabin, MD, FAAFP. [facility] [place], [place] [phone]   12/17/2023, 10:03 AM

## 2023-12-17 NOTE — Patient Instructions (Signed)
 VISIT SUMMARY:  During today's visit, we discussed your ongoing issues with chronic diarrhea, elevated blood pressure, and other health concerns. We reviewed your current medications and made some adjustments to better manage your conditions.  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 You will be referred to a gastroenterologist for further evaluation and management.  -HYPERTENSION: Hypertension, or high blood pressure, was noted to be elevated today. This can be influenced by smoking and certain medications like Adderall. We have prescribed hydrochlorothiazide  to help manage your blood pressure and advised you to avoid smoking and caffeine before appointments. Refills for amlodipine  have also been sent.  -DEPRESSION AND ANXIETY DISORDER: Your depression and anxiety are being managed with medications including Zoloft , Wellbutrin , and Adderall. Trazodone  is used as needed due to its drowsiness effect. We will provide documentation of today's blood pressure reading to your behavioral health provider.  -VITAMIN D  DEFICIENCY: Vitamin D  deficiency means your body has low levels of vitamin D , which is important for bone health and overall well-being. We will check your vitamin D  levels and have sent a refill for your vitamin D  supplement.  -ONYCHOTILLOMANIA (COMPULSIVE NAIL PICKING): Onychotillomania is a condition where you compulsively pick at your nails. We recommend applying nail polish or artificial nails to help prevent this behavior.  INSTRUCTIONS:  Please follow up with the gastroenterologist as referred for your chronic diarrhea. Continue taking your prescribed medications and avoid smoking and caffeine before your appointments to help manage your blood pressure. We will check your vitamin D  levels and have sent a refill for your  supplement. Documentation of your blood pressure reading will be provided to your behavioral health provider.

## 2023-12-27 ENCOUNTER — Other Ambulatory Visit: Payer: Self-pay | Admitting: Family Medicine

## 2023-12-27 DIAGNOSIS — B9689 Other specified bacterial agents as the cause of diseases classified elsewhere: Secondary | ICD-10-CM

## 2023-12-27 DIAGNOSIS — B3731 Acute candidiasis of vulva and vagina: Secondary | ICD-10-CM

## 2024-01-05 ENCOUNTER — Ambulatory Visit: Attending: Family Medicine

## 2024-01-05 DIAGNOSIS — I10 Essential (primary) hypertension: Secondary | ICD-10-CM | POA: Diagnosis not present

## 2024-01-05 DIAGNOSIS — E559 Vitamin D deficiency, unspecified: Secondary | ICD-10-CM | POA: Diagnosis not present

## 2024-01-06 ENCOUNTER — Encounter: Payer: Self-pay | Admitting: Family Medicine

## 2024-01-06 ENCOUNTER — Ambulatory Visit: Payer: Self-pay | Admitting: Family Medicine

## 2024-01-06 ENCOUNTER — Ambulatory Visit: Admitting: Family Medicine

## 2024-01-06 LAB — CMP14+EGFR
ALT: 16 IU/L (ref 0–32)
AST: 14 IU/L (ref 0–40)
Albumin: 4 g/dL (ref 3.9–4.9)
Alkaline Phosphatase: 103 IU/L (ref 44–121)
BUN/Creatinine Ratio: 9 (ref 9–23)
BUN: 8 mg/dL (ref 6–20)
Bilirubin Total: 0.3 mg/dL (ref 0.0–1.2)
CO2: 21 mmol/L (ref 20–29)
Calcium: 9.7 mg/dL (ref 8.7–10.2)
Chloride: 103 mmol/L (ref 96–106)
Creatinine, Ser: 0.86 mg/dL (ref 0.57–1.00)
Globulin, Total: 2.8 g/dL (ref 1.5–4.5)
Glucose: 74 mg/dL (ref 70–99)
Potassium: 3.7 mmol/L (ref 3.5–5.2)
Sodium: 138 mmol/L (ref 134–144)
Total Protein: 6.8 g/dL (ref 6.0–8.5)
eGFR: 88 mL/min/1.73 (ref >59)

## 2024-01-06 LAB — LP+NON-HDL CHOLESTEROL
Cholesterol, Total: 178 mg/dL (ref 100–199)
HDL: 49 mg/dL (ref >39)
LDL Chol Calc (NIH): 116 mg/dL — ABNORMAL HIGH (ref 0–99)
Total Non-HDL-Chol (LDL+VLDL): 129 mg/dL (ref 0–129)
Triglycerides: 68 mg/dL (ref 0–149)
VLDL Cholesterol Cal: 13 mg/dL (ref 5–40)

## 2024-01-06 LAB — VITAMIN D 25 HYDROXY (VIT D DEFICIENCY, FRACTURES): Vit D, 25-Hydroxy: 33.9 ng/mL (ref 30.0–100.0)

## 2024-01-10 ENCOUNTER — Telehealth: Admitting: Family Medicine

## 2024-01-10 DIAGNOSIS — B3731 Acute candidiasis of vulva and vagina: Secondary | ICD-10-CM | POA: Diagnosis not present

## 2024-01-10 MED ORDER — FLUCONAZOLE 150 MG PO TABS: 150.0000 mg | ORAL_TABLET | ORAL | 0 refills | Status: DC | PRN

## 2024-01-10 NOTE — Patient Instructions (Signed)

## 2024-01-10 NOTE — Progress Notes (Signed)
 Virtual Visit Consent   Kelli Wells, you are scheduled for a virtual visit with a [facility] provider today. Just as with appointments in the office, your consent must be obtained to participate. Your consent will be active for this visit and any virtual visit you may have with one of our providers in the next 365 days. If you have a MyChart account, a copy of this consent can be sent to you electronically.  As this is a virtual visit, video technology does not allow for your provider to perform a traditional examination. This may limit your provider's ability to fully assess your condition. If your provider identifies any concerns that need to be evaluated in person or the need to arrange testing (such as labs, EKG, etc.), we will make arrangements to do so. Although advances in technology are sophisticated, we cannot ensure that it will always work on either your end or our end. If the connection with a video visit is poor, the visit may have to be switched to a telephone visit. With either a video or telephone visit, we are not always able to ensure that we have a secure connection.  By engaging in this virtual visit, you consent to the provision of healthcare and authorize for your insurance to be billed (if applicable) for the services provided during this visit. Depending on your insurance coverage, you may receive a charge related to this service.  I need to obtain your verbal consent now. Are you willing to proceed with your visit today? Kelli Wells has provided verbal consent on 01/10/2024 for a virtual visit (video or telephone). Kelli Lamp, FNP  Date: 01/10/2024 9:33 AM   Virtual Visit via Video Note   I, Kelli Wells, connected with  Kelli Wells  ([id number], [birth year]) on 01/10/24 at  9:30 AM EDT by a video-enabled telemedicine application and verified that I am speaking with the correct person using two identifiers.  Location: Patient: Virtual Visit  Location Patient: Home Provider: Virtual Visit Location Provider: Home Office   I discussed the limitations of evaluation and management by telemedicine and the availability of in person appointments. The patient expressed understanding and agreed to proceed.    History of Present Illness: Kelli Wells is a 38 y.o. who identifies as a female who was assigned female at birth, and is being seen today for vaginal itching and discharge, no odor, complains of yeast infection. Kelli Wells  HPI: HPI  Problems:  Patient Active Problem List   Diagnosis Date Noted   Suspected sleep apnea 02/27/2023   Mild intermittent asthma 01/08/2022   Chronic allergic conjunctivitis 01/08/2022   Allergic rhinitis due to pollen 01/08/2022   Allergic rhinitis due to animal (cat) (dog) hair and dander 01/08/2022   Allergic rhinitis 01/08/2022   Adverse reaction to food 01/08/2022   Carrier of group B Streptococcus 01/08/2022   Vitamin D  deficiency 12/21/2021   Stress 01/10/2021   Psychophysiological insomnia 01/10/2021   Abnormal cervical Papanicolaou smear 01/09/2021   Asthma    Cholecystitis with cholelithiasis 12/17/2014   Non-reactive NST (non-stress test)    Postpartum depression 02/02/2013   Esophageal reflux 02/02/2013   Hypertensive disorder 05/02/2011   Anxiety state 05/02/2011   Attention deficit hyperactivity disorder (ADHD) 05/02/2011    Allergies:  Allergies  Allergen Reactions   Other Other (See Comments)   Shellfish Allergy Swelling    Facial swelling    Shrimp (Diagnostic) Anxiety, Hives, Itching and Swelling   Medications:  Current Outpatient  Medications:    albuterol  (VENTOLIN  HFA) 108 (90 Base) MCG/ACT inhaler, Inhale 1-2 puffs into the lungs every 6 (six) hours as needed for wheezing or shortness of breath., Disp: 18 g, Rfl: 6   amLODipine  (NORVASC ) 10 MG tablet, Take 1 tablet (10 mg total) by mouth daily., Disp: 90 tablet, Rfl: 1   amphetamine-dextroamphetamine (ADDERALL) 30 MG  tablet, Take 1 tablet by mouth daily., Disp: , Rfl:    Blood Pressure Monitoring (BLOOD PRESSURE CUFF) MISC, Use to check blood pressure once daily., Disp: 1 each, Rfl: 0   buPROPion  (WELLBUTRIN  XL) 300 MG 24 hr tablet, Take 1 tablet (300 mg total) by mouth every morning., Disp: 30 tablet, Rfl: 0   carvedilol  (COREG ) 25 MG tablet, Take 1 tablet (25 mg total) by mouth 2 (two) times daily with a meal., Disp: 180 tablet, Rfl: 1   clindamycin  (CLEOCIN  T) 1 % external solution, Apply topically 2 (two) times daily. For hidradenitis, Disp: 60 mL, Rfl: 1   clonazePAM  (KLONOPIN ) 0.5 MG tablet, Take 0.5 mg by mouth daily., Disp: , Rfl:    cloNIDine HCl (KAPVAY) 0.1 MG TB12 ER tablet, Take 0.1 mg by mouth daily., Disp: , Rfl:    EPINEPHrine  (EPIPEN  2-PAK) 0.3 mg/0.3 mL IJ SOAJ injection, Inject 0.3 mLs (0.3 mg total) into the muscle as needed for anaphylaxis., Disp: 1 each, Rfl: 1   fluconazole  (DIFLUCAN ) 150 MG tablet, Take 1 tablet (150 mg total) by mouth every 3 (three) days as needed., Disp: 2 tablet, Rfl: 0   fluticasone  (FLONASE ) 50 MCG/ACT nasal spray, Place 2 sprays into both nostrils daily., Disp: 16 g, Rfl: 1   hydrochlorothiazide  (HYDRODIURIL ) 12.5 MG tablet, Take 1 tablet (12.5 mg total) by mouth daily., Disp: 90 tablet, Rfl: 1   hydrOXYzine  (ATARAX ) 10 MG tablet, Take 10 mg by mouth 3 (three) times daily., Disp: , Rfl:    hydrOXYzine  (ATARAX ) 25 MG tablet, Take 1 tablet (25 mg total) by mouth 3 (three) times daily as needed., Disp: 90 tablet, Rfl: 0   ipratropium-albuterol  (DUONEB) 0.5-2.5 (3) MG/3ML SOLN, USE 1 AMPULE IN NEBULIZER VIA NEBULIZATION EVERY 6 HOURS AS NEEDED, Disp: 360 mL, Rfl: 0   ipratropium-albuterol  (DUONEB) 0.5-2.5 (3) MG/3ML SOLN, Take 3 mLs by nebulization every 6 (six) hours as needed., Disp: 120 mL, Rfl: 0   loratadine  (CLARITIN ) 10 MG tablet, Take 1 tablet (10 mg total) by mouth daily., Disp: 30 tablet, Rfl: 1   methocarbamol  (ROBAXIN ) 500 MG tablet, Take 1 tablet (500 mg  total) by mouth every 8 (eight) hours as needed for muscle spasms., Disp: 90 tablet, Rfl: 1   naproxen  (NAPROSYN ) 500 MG tablet, Take 1 tablet (500 mg total) by mouth 2 (two) times daily with a meal., Disp: 60 tablet, Rfl: 6   sertraline  (ZOLOFT ) 100 MG tablet, Take 1.5 tablets (150 mg total) by mouth daily., Disp: 45 tablet, Rfl: 0   traZODone  (DESYREL ) 50 MG tablet, Take 1 tablet (50 mg total) by mouth at bedtime as needed for sleep., Disp: 30 tablet, Rfl: 6   Vitamin D , Ergocalciferol , (DRISDOL ) 1.25 MG (50000 UNIT) CAPS capsule, Take 1 capsule (50,000 Units total) by mouth once a week., Disp: 12 capsule, Rfl: 1  Observations/Objective: Patient is well-developed, well-nourished in no acute distress.  Resting comfortably  at home.  Head is normocephalic, atraumatic.  No labored breathing.  Speech is clear and coherent with logical content.  Patient is alert and oriented at baseline.    Assessment and Plan: 1. Candidiasis  of vagina (Primary)  2. Yeast vaginitis - fluconazole  (DIFLUCAN ) 150 MG tablet; Take 1 tablet (150 mg total) by mouth every 3 (three) days as needed.  Dispense: 2 tablet; Refill: 0  F/U with GYN as needed.   Follow Up Instructions: I discussed the assessment and treatment plan with the patient. The patient was provided an opportunity to ask questions and all were answered. The patient agreed with the plan and demonstrated an understanding of the instructions.  A copy of instructions were sent to the patient via MyChart unless otherwise noted below.     The patient was advised to call back or seek an in-person evaluation if the symptoms worsen or if the condition fails to improve as anticipated.    Kadeidra Coryell, FNP

## 2024-01-16 ENCOUNTER — Other Ambulatory Visit: Payer: Self-pay | Admitting: Family Medicine

## 2024-01-16 DIAGNOSIS — B3731 Acute candidiasis of vulva and vagina: Secondary | ICD-10-CM

## 2024-01-16 MED ORDER — METRONIDAZOLE 500 MG PO TABS: 2000.0000 mg | ORAL_TABLET | ORAL | 1 refills | Status: DC

## 2024-01-16 MED ORDER — FLUCONAZOLE 150 MG PO TABS: 150.0000 mg | ORAL_TABLET | ORAL | 1 refills | Status: DC

## 2024-01-22 ENCOUNTER — Encounter: Payer: Self-pay | Admitting: Family Medicine

## 2024-01-23 ENCOUNTER — Telehealth: Admitting: Physician Assistant

## 2024-01-23 DIAGNOSIS — B379 Candidiasis, unspecified: Secondary | ICD-10-CM | POA: Diagnosis not present

## 2024-01-23 DIAGNOSIS — R3989 Other symptoms and signs involving the genitourinary system: Secondary | ICD-10-CM

## 2024-01-23 DIAGNOSIS — T3695XA Adverse effect of unspecified systemic antibiotic, initial encounter: Secondary | ICD-10-CM

## 2024-01-23 MED ORDER — FLUCONAZOLE 150 MG PO TABS: 150.0000 mg | ORAL_TABLET | ORAL | 0 refills | Status: DC | PRN

## 2024-01-23 MED ORDER — NITROFURANTOIN MONOHYD MACRO 100 MG PO CAPS: 100.0000 mg | ORAL_CAPSULE | Freq: Two times a day (BID) | ORAL | 0 refills | Status: AC

## 2024-01-23 NOTE — Progress Notes (Signed)
 E-Visit for Urinary Problems  We are sorry that you are not feeling well.  Here is how we plan to help!  Based on what you shared with me it looks like you most likely have a simple urinary tract infection.  A UTI (Urinary Tract Infection) is a bacterial infection of the bladder.  Most cases of urinary tract infections are simple to treat but a key part of your care is to encourage you to drink plenty of fluids and watch your symptoms carefully.  I have prescribed MacroBid 100 mg twice a day for 5 days.  Your symptoms should gradually improve. Call us if the burning in your urine worsens, you develop worsening fever, back pain or pelvic pain or if your symptoms do not resolve after completing the antibiotic.  Urinary tract infections can be prevented by drinking plenty of water to keep your body hydrated.  Also be sure when you wipe, wipe from front to back and don't hold it in!  If possible, empty your bladder every 4 hours.  Diflucan given as prophylaxis as patient tends to get vaginal yeast infections with antibiotic use.   HOME CARE Drink plenty of fluids Compete the full course of the antibiotics even if the symptoms resolve Remember, when you need to go.go. Holding in your urine can increase the likelihood of getting a UTI! GET HELP RIGHT AWAY IF: You cannot urinate You get a high fever Worsening back pain occurs You see blood in your urine You feel sick to your stomach or throw up You feel like you are going to pass out  MAKE SURE YOU  Understand these instructions. Will watch your condition. Will get help right away if you are not doing well or get worse.   Thank you for choosing an e-visit.  Your e-visit answers were reviewed by a board certified advanced clinical practitioner to complete your personal care plan. Depending upon the condition, your plan could have included both over the counter or prescription medications.  Please review your pharmacy choice. Make sure  the pharmacy is open so you can pick up prescription now. If there is a problem, you may contact your provider through [employer] and have the prescription routed to another pharmacy.  Your safety is important to [place]. If you have drug allergies check your prescription carefully.   For the next 24 hours you can use MyChart to ask questions about today's visit, request a non-urgent call back, or ask for a work or school excuse. You will get an email in the next two days asking about your experience. I hope that your e-visit has been valuable and will speed your recovery.   I have spent 5 minutes in review of e-visit questionnaire, review and updating patient chart, medical decision making and response to patient.   Margaretann Loveless, PA-C

## 2024-01-24 ENCOUNTER — Telehealth

## 2024-01-26 ENCOUNTER — Ambulatory Visit: Payer: Self-pay

## 2024-01-26 NOTE — Telephone Encounter (Signed)
 FYI Only or Action Required?: FYI only for provider.  Patient was last seen in primary care on 01/10/2024 by Blair, Diane W, FNP.  Called Nurse Triage reporting burning with urination.  Symptoms began several days ago.  Interventions attempted: Nothing.  Symptoms are: unchanged.  Triage Disposition: See Physician Within 24 Hours  Patient/caregiver understands and will follow disposition?: Yes    Copied from CRM [id number]. Topic: Clinical - Red Word Triage >> Jan 26, 2024  2:02 PM Thliyah D wrote: methocarbamol  (ROBAXIN ) 500 MG tablet- was given this for BV. Pt says it burns really bad when she pees and her leg burns. It gave her a yeast infection. It still burns. And her legs hurt. Reason for Disposition  More than 2 UTI's in last year  Answer Assessment - Initial Assessment Questions 1. SEVERITY: How bad is the pain?  (e.g., Scale 1-10; mild, moderate, or severe)     moderate 2. FREQUENCY: How many times have you had painful urination today?      Burning when she urinates, every time 3. PATTERN: Is pain present every time you urinate or just sometimes?      constant 4. ONSET: When did the painful urination start?      The next day 5. FEVER: Do you have a fever? If Yes, ask: What is your temperature, how was it measured, and when did it start?     denies 6. PAST UTI: Have you had a urine infection before? If Yes, ask: When was the last time? and What happened that time?      yes 7. CAUSE: What do you think is causing the painful urination?  (e.g., UTI, scratch, Herpes sore)     uti 8. OTHER SYMPTOMS: Do you have any other symptoms? (e.g., blood in urine, flank pain, genital sores, urgency, vaginal discharge)     denies 9. PREGNANCY: Is there any chance you are pregnant? When was your last menstrual period?     na  Protocols used: Urination Pain - Female-A-AH

## 2024-01-28 ENCOUNTER — Ambulatory Visit
Admission: RE | Admit: 2024-01-28 | Discharge: 2024-01-28 | Disposition: A | Discharge status: Home / Self Care | Source: Ambulatory Visit | Attending: Nurse Practitioner | Admitting: Nurse Practitioner

## 2024-01-28 VITALS — BP 129/88 | HR 85 | Temp 98.1°F | Resp 17

## 2024-01-28 DIAGNOSIS — R300 Dysuria: Secondary | ICD-10-CM | POA: Diagnosis not present

## 2024-01-28 DIAGNOSIS — G5782 Other specified mononeuropathies of left lower limb: Secondary | ICD-10-CM | POA: Diagnosis not present

## 2024-01-28 DIAGNOSIS — T378X5A Adverse effect of other specified systemic anti-infectives and antiparasitics, initial encounter: Secondary | ICD-10-CM | POA: Diagnosis not present

## 2024-01-28 LAB — POCT URINE DIPSTICK
Bilirubin, UA: NEGATIVE
Blood, UA: NEGATIVE
Glucose, UA: NEGATIVE mg/dL
Ketones, POC UA: NEGATIVE mg/dL
Nitrite, UA: NEGATIVE
Spec Grav, UA: 1.025 (ref 1.010–1.025)
Urobilinogen, UA: 1 U/dL
pH, UA: 6.5 (ref 5.0–8.0)

## 2024-01-28 LAB — POCT URINE PREGNANCY: Preg Test, Ur: NEGATIVE

## 2024-01-28 MED ORDER — GABAPENTIN 100 MG PO CAPS: 100.0000 mg | ORAL_CAPSULE | Freq: Three times a day (TID) | ORAL | 0 refills | Status: AC

## 2024-01-28 MED ORDER — URIBEL 118 MG PO CAPS: 1.0000 | ORAL_CAPSULE | Freq: Four times a day (QID) | ORAL | 0 refills | Status: AC

## 2024-01-28 NOTE — ED Provider Notes (Signed)
 [facility]    CSN: [id number] Arrival date & time: 01/28/24  9061      History   Chief Complaint Chief Complaint  Patient presents with   Leg Pain    Also lingering uti that has not gotten better - Entered by patient    HPI Kelli Wells is a 38 y.o. female.   Discussed the use of AI scribe software for clinical note transcription with the patient, who gave verbal consent to proceed.   The patient presents with multiple complaints following recent treatment for vaginal and urinary tract infections. Her primary concerns are burning pain with urination and bilateral leg pain. She describes a complex history of recent healthcare encounters for vaginal itching and dysuria and notes a longstanding tendency to develop recurrent yeast and bacterial vaginal infections.  On 01/10/2024, the patient was evaluated virtually for vaginal itching and discharge consistent with prior yeast infections and was prescribed Diflucan . She had a second virtual visit on 01/23/2024 for dysuria and was prescribed nitrofurantoin , which she is still taking. On 01/26/2024, she contacted her primary care office reporting back pain with urination and leg pain. At that time, she was prescribed methocarbamol  for leg pain and was started on suppressive therapy for bacterial vaginosis with Flagyl  2000 mg every 30 days along with Diflucan  150 mg every 30 days.  Since starting this regimen, the patient reports developing bilateral leg pain localized to the upper thighs and groin, described as a burning sensation. She states that the methocarbamol  and ibuprofen  has not relieved her symptoms. She has been using over-the-counter AZO for dysuria with minimal benefit.   She also endorses mild low back pain. She denies fever, lower extremity swelling, abdominal pain, nausea, vomiting, vaginal itching, irritation, or odor. The patient has a Nexplanon  implant for birth control and does not experience menstrual  periods. She reports smoking cigarettes.  The following sections of the patient's history were reviewed and updated as appropriate: allergies, current medications, past family history, past medical history, past social history, past surgical history, and problem list.     Past Medical History:  Diagnosis Date   Anxiety    Asthma    Chronic hypertension in pregnancy    Depression    GERD (gastroesophageal reflux disease)    Hypertension    Infection    UTI   Pregnancy induced hypertension    Vaginal Pap smear, abnormal    LEEP, normal since    Patient Active Problem List   Diagnosis Date Noted   Suspected sleep apnea 02/27/2023   Mild intermittent asthma 01/08/2022   Chronic allergic conjunctivitis 01/08/2022   Allergic rhinitis due to pollen 01/08/2022   Allergic rhinitis due to animal (cat) (dog) hair and dander 01/08/2022   Allergic rhinitis 01/08/2022   Adverse reaction to food 01/08/2022   Carrier of group B Streptococcus 01/08/2022   Vitamin D  deficiency 12/21/2021   Stress 01/10/2021   Psychophysiological insomnia 01/10/2021   Abnormal cervical Papanicolaou smear 01/09/2021   Asthma    Cholecystitis with cholelithiasis 12/17/2014   Non-reactive NST (non-stress test)    Postpartum depression 02/02/2013   Esophageal reflux 02/02/2013   Hypertensive disorder 05/02/2011   Anxiety state 05/02/2011   Attention deficit hyperactivity disorder (ADHD) 05/02/2011    Past Surgical History:  Procedure Laterality Date   CHOLECYSTECTOMY N/A 12/18/2014   Procedure: LAPAROSCOPIC CHOLECYSTECTOMY WITH INTRAOPERATIVE CHOLANGIOGRAM;  Surgeon: Deward Null III, MD;  Location: [facility] OR;  Service: General;  Laterality: N/A;   LEEP  LYMPH GLAND EXCISION      OB History     Gravida  2   Para  2   Term  2   Preterm  0   AB  0   Living  2      SAB  0   IAB  0   Ectopic  0   Multiple  0   Live Births  2            Home Medications    Prior to Admission  medications   Medication Sig Start Date End Date Taking? Authorizing Provider  gabapentin  (NEURONTIN ) 100 MG capsule Take 1 capsule (100 mg total) by mouth 3 (three) times daily for 10 days. 01/28/24 02/07/24 Yes Iola Lukes, FNP  Meth-Hyo-M Starlene Phos-Ph Sal (URIBEL ) 118 MG CAPS Take 1 capsule (118 mg total) by mouth in the morning, at noon, in the evening, and at bedtime. 01/28/24  Yes Iola Lukes, FNP  albuterol  (VENTOLIN  HFA) 108 (90 Base) MCG/ACT inhaler Inhale 1-2 puffs into the lungs every 6 (six) hours as needed for wheezing or shortness of breath. 07/15/22   Newlin, Enobong, MD  amLODipine  (NORVASC ) 10 MG tablet Take 1 tablet (10 mg total) by mouth daily. 12/17/23   Newlin, Enobong, MD  amphetamine-dextroamphetamine (ADDERALL) 30 MG tablet Take 1 tablet by mouth daily. 05/20/23   [provider]  Blood Pressure Monitoring (BLOOD PRESSURE CUFF) MISC Use to check blood pressure once daily. 09/07/21   Newlin, Enobong, MD  buPROPion  (WELLBUTRIN  XL) 300 MG 24 hr tablet Take 1 tablet (300 mg total) by mouth every morning. 01/08/22   Newlin, Enobong, MD  carvedilol  (COREG ) 25 MG tablet Take 1 tablet (25 mg total) by mouth 2 (two) times daily with a meal. 12/17/23   Delbert Clam, MD  clindamycin  (CLEOCIN  T) 1 % external solution Apply topically 2 (two) times daily. For hidradenitis 07/15/22   Newlin, Enobong, MD  clonazePAM  (KLONOPIN ) 0.5 MG tablet Take 0.5 mg by mouth daily. 05/20/23   [provider]  cloNIDine HCl (KAPVAY) 0.1 MG TB12 ER tablet Take 0.1 mg by mouth daily. 08/24/22   [provider]  EPINEPHrine  (EPIPEN  2-PAK) 0.3 mg/0.3 mL IJ SOAJ injection Inject 0.3 mLs (0.3 mg total) into the muscle as needed for anaphylaxis. 09/08/19   Midge Golas, MD  fluticasone  (FLONASE ) 50 MCG/ACT nasal spray Place 2 sprays into both nostrils daily. 06/21/19   Newlin, Enobong, MD  hydrochlorothiazide  (HYDRODIURIL ) 12.5 MG tablet Take 1 tablet (12.5 mg total) by mouth daily. 12/17/23    Newlin, Enobong, MD  hydrOXYzine  (ATARAX ) 10 MG tablet Take 10 mg by mouth 3 (three) times daily. 03/30/23   [provider]  hydrOXYzine  (ATARAX ) 25 MG tablet Take 1 tablet (25 mg total) by mouth 3 (three) times daily as needed. 09/18/22   Newlin, Enobong, MD  ipratropium-albuterol  (DUONEB) 0.5-2.5 (3) MG/3ML SOLN USE 1 AMPULE IN NEBULIZER VIA NEBULIZATION EVERY 6 HOURS AS NEEDED 03/06/20 12/17/23  Wieters, Hallie C, PA-C  ipratropium-albuterol  (DUONEB) 0.5-2.5 (3) MG/3ML SOLN Take 3 mLs by nebulization every 6 (six) hours as needed. 05/29/23   Danton Jon HERO, PA-C  loratadine  (CLARITIN ) 10 MG tablet Take 1 tablet (10 mg total) by mouth daily. 10/20/18   Newlin, Enobong, MD  methocarbamol  (ROBAXIN ) 500 MG tablet Take 1 tablet (500 mg total) by mouth every 8 (eight) hours as needed for muscle spasms. 01/08/22   Newlin, Enobong, MD  naproxen  (NAPROSYN ) 500 MG tablet Take 1 tablet (500 mg  total) by mouth 2 (two) times daily with a meal. 09/29/18   Delbert Clam, MD  nitrofurantoin , macrocrystal-monohydrate, (MACROBID ) 100 MG capsule Take 1 capsule (100 mg total) by mouth 2 (two) times daily. 01/23/24   Vivienne Delon HERO, PA-C  sertraline  (ZOLOFT ) 100 MG tablet Take 1.5 tablets (150 mg total) by mouth daily. 12/02/22   Newlin, Enobong, MD  Vitamin D , Ergocalciferol , (DRISDOL ) 1.25 MG (50000 UNIT) CAPS capsule Take 1 capsule (50,000 Units total) by mouth once a week. 12/17/23   Delbert Clam, MD    Family History Family History  Problem Relation Age of Onset   Hypertension Maternal Grandmother    Cancer Maternal Grandmother        breast   Hypertension Maternal Grandfather    Heart attack Maternal Grandfather    Kidney disease Paternal Grandmother    Heart disease Paternal Grandfather    Hypertension Mother    Cancer Mother        rectal   Hearing loss Neg Hx     Social History Social History   Tobacco Use   Smoking status: Every Day    Current packs/day: 0.25    Types:  Cigarettes   Smokeless tobacco: Never  Vaping Use   Vaping status: Never Used  Substance Use Topics   Alcohol use: Yes    Alcohol/week: 2.0 - 3.0 standard drinks of alcohol    Types: 2 - 3 Glasses of wine per week    Comment: occas   Drug use: No     Allergies   Metronidazole , Other, Shellfish allergy, and Shrimp (diagnostic)   Review of Systems Review of Systems  Constitutional:  Negative for fever.  Cardiovascular:  Negative for leg swelling.  Gastrointestinal:  Negative for abdominal pain, diarrhea, nausea and vomiting.  Genitourinary:  Positive for dysuria (and pressure). Negative for menstrual problem (doesn't get menses due to nexplanon ).       No vaginal itching, irritation or odor   Musculoskeletal:  Positive for arthralgias (bilateral LE pain, mainly throughout the thighs) and back pain (lower).  All other systems reviewed and are negative.    Physical Exam Triage Vital Signs ED Triage Vitals  Encounter Vitals Group     BP 01/28/24 0950 129/88     Girls Systolic BP Percentile --      Girls Diastolic BP Percentile --      Boys Systolic BP Percentile --      Boys Diastolic BP Percentile --      Pulse Rate 01/28/24 0950 85     Resp 01/28/24 0950 17     Temp 01/28/24 0950 98.1 F (36.7 C)     Temp Source 01/28/24 0944 Oral     SpO2 01/28/24 0950 98 %     Weight --      Height --      Head Circumference --      Peak Flow --      Pain Score 01/28/24 0955 5     Pain Loc --      Pain Education --      Exclude from Growth Chart --    No data found.  Updated Vital Signs BP 129/88 (BP Location: Right Arm)   Pulse 85   Temp 98.1 F (36.7 C) (Oral)   Resp 17   SpO2 98%   Visual Acuity Right Eye Distance:   Left Eye Distance:   Bilateral Distance:    Right Eye Near:   Left Eye Near:  Bilateral Near:     Physical Exam Constitutional:      General: She is not in acute distress.    Appearance: Normal appearance. She is not ill-appearing,  toxic-appearing or diaphoretic.  HENT:     Head: Normocephalic.     Nose: Nose normal.     Mouth/Throat:     Mouth: Mucous membranes are moist.  Eyes:     Conjunctiva/sclera: Conjunctivae normal.  Cardiovascular:     Rate and Rhythm: Normal rate.  Pulmonary:     Effort: Pulmonary effort is normal.  Abdominal:     Palpations: Abdomen is soft.  Genitourinary:    Comments: Deferred; patient performed self-swab for Aptima testing  Musculoskeletal:        General: No swelling. Normal range of motion.     Cervical back: Normal range of motion and neck supple.       Legs:     Comments: Diffuse pain involving the bilateral upper thighs and groin. Strength, sensation, and range of motion are intact throughout both lower extremities. No focal neurological deficits observed. Neurovascular status remains intact.  Skin:    General: Skin is warm and dry.  Neurological:     General: No focal deficit present.     Mental Status: She is alert and oriented to person, place, and time.     Sensory: Sensation is intact. No sensory deficit.     Motor: Motor function is intact.     Gait: Gait is intact.  Psychiatric:        Mood and Affect: Mood normal.        Speech: Speech normal.        Behavior: Behavior normal.      UC Treatments / Results  Labs (all labs ordered are listed, but only abnormal results are displayed) Labs Reviewed  POCT URINE DIPSTICK - Abnormal; Notable for the following components:      Result Value   Color, UA orange (*)    Clarity, UA hazy (*)    Leukocytes, UA Trace (*)    All other components within normal limits  URINE CULTURE  CBC WITH DIFFERENTIAL/PLATELET  COMPREHENSIVE METABOLIC PANEL WITH GFR  VITAMIN B12  FOLATE  MAGNESIUM   POCT URINE PREGNANCY  CERVICOVAGINAL ANCILLARY ONLY    EKG   Radiology No results found.  Procedures Procedures (including critical care time)  Medications Ordered in UC Medications - No data to display  Initial  Impression / Assessment and Plan / UC Course  I have reviewed the triage vital signs and the nursing notes.  Pertinent labs & imaging results that were available during my care of the patient were reviewed by me and considered in my medical decision making (see chart for details).     The patient presents with persistent urinary symptoms and new onset of bilateral leg and groin pain following recent treatment for recurrent vaginal and urinary tract infections. 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 Metronidazole  was discontinued and the patient was started on gabapentin  for neuropathic pain, with laboratory evaluation ordered including CBC, CMP, vitamin B12, folate, iron, and magnesium . She was advised that symptoms should improve within two weeks of discontinuing the medication and to follow up with her primary care provider within 7 to 10 days.  In addition, the patient continues to report dysuria and urinary pressure despite ongoing nitrofurantoin  therapy. Urinalysis demonstrated trace leukocytes with orange discoloration from  phenazopyridine use. A urine culture was obtained and treatment will be adjusted based on results. Supportive care measures were discussed including increased hydration, avoidance of caffeine, and urination after intercourse to reduce recurrence. The patient was instructed to monitor symptoms closely, follow up with her primary care provider if symptoms persist, and seek emergency evaluation for fever, flank pain, worsening neuropathic symptoms, or inability to urinate.  Today's evaluation has revealed no signs of a dangerous process. Discussed diagnosis with patient and/or guardian. Patient and/or guardian aware of their diagnosis, possible red flag symptoms to watch out for and need for close follow up. Patient and/or guardian  understands verbal and written discharge instructions. Patient and/or guardian comfortable with plan and disposition.  Patient and/or guardian has a clear mental status at this time, good insight into illness (after discussion and teaching) and has clear judgment to make decisions regarding their care  Documentation was completed with the aid of voice recognition software. Transcription may contain typographical errors.  Final Clinical Impressions(s) / UC Diagnoses   Final diagnoses:  Dysuria  Neuropathy of left genitofemoral nerve  Adverse effect of metronidazole , initial encounter     Discharge Instructions      You were seen today for leg and groin pain as well as ongoing urinary symptoms. Your exam and history suggest that the nerve pain is most likely related to a high dose of metronidazole  (Flagyl ) that you were recently prescribed. This medication has been stopped, and your pain should gradually improve within about two weeks. You have been prescribed gabapentin  to take three times daily for 10 days to help manage the nerve pain. It is important to take this medication as directed and avoid skipping doses. Blood work has been ordered to check for any vitamin or electrolyte issues that could affect your nerves.  For your urinary symptoms, you are continuing nitrofurantoin  until your urine culture results are available. This test will help determine if a different antibiotic is needed. In the meantime, it is important to stay well hydrated by drinking plenty of fluids throughout the day. This helps flush out your urinary system and keeps your urine light yellow, which is a sign of good hydration. Avoid caffeine and alcohol, as they can irritate the bladder. Be sure to urinate regularly and empty your bladder fully. Do not hold your urine for extended periods. Always wipe from front to back after using the bathroom and use a clean tissue for each wipe. It is also important to urinate after  sexual activity. Avoid douching or using sprays or powders in the genital area, as these can cause irritation.   Please follow up with your primary care provider in about 10 days to review your lab results and check on your symptoms. Seek care sooner if your urinary burning or pressure does not improve, or if your nerve pain worsens.   Go to the emergency department right away if you develop a fever, flank or back pain, sudden worsening of your leg or groin pain, difficulty walking, new weakness or numbness, or if you are unable to urinate.     ED Prescriptions     Medication Sig Dispense Auth. Provider   gabapentin  (NEURONTIN ) 100 MG capsule Take 1 capsule (100 mg total) by mouth 3 (three) times daily for 10 days. 30 capsule Iola Lukes, FNP   Meth-Hyo-M Bl-Na Phos-Ph Sal (URIBEL ) 118 MG CAPS Take 1 capsule (118 mg total) by mouth in the morning, at noon, in the evening, and at bedtime. 20  capsule Iola Lukes, FNP      PDMP not reviewed this encounter.   Iola Lukes, [place] 01/28/24 1116

## 2024-01-28 NOTE — Discharge Instructions (Addendum)
 You were seen today for leg and groin pain as well as ongoing urinary symptoms. Your exam and history suggest that the nerve pain is most likely related to a high dose of metronidazole  (Flagyl ) that you were recently prescribed. This medication has been stopped, and your pain should gradually improve within about two weeks. You have been prescribed gabapentin  to take three times daily for 10 days to help manage the nerve pain. It is important to take this medication as directed and avoid skipping doses. Blood work has been ordered to check for any vitamin or electrolyte issues that could affect your nerves.  For your urinary symptoms, you are continuing nitrofurantoin  until your urine culture results are available. This test will help determine if a different antibiotic is needed. In the meantime, it is important to stay well hydrated by drinking plenty of fluids throughout the day. This helps flush out your urinary system and keeps your urine light yellow, which is a sign of good hydration. Avoid caffeine and alcohol, as they can irritate the bladder. Be sure to urinate regularly and empty your bladder fully. Do not hold your urine for extended periods. Always wipe from front to back after using the bathroom and use a clean tissue for each wipe. It is also important to urinate after sexual activity. Avoid douching or using sprays or powders in the genital area, as these can cause irritation.   Please follow up with your primary care provider in about 10 days to review your lab results and check on your symptoms. Seek care sooner if your urinary burning or pressure does not improve, or if your nerve pain worsens.   Go to the emergency department right away if you develop a fever, flank or back pain, sudden worsening of your leg or groin pain, difficulty walking, new weakness or numbness, or if you are unable to urinate.

## 2024-01-28 NOTE — ED Triage Notes (Signed)
 Pt states she took flagyl  macrobid  and diflucan  recently. Pt states she took 5 flagyl  pill in one day per provider. After that she began to have bilateral leg pain.  She presents today due to feeling for Uti and possible yeast infection.

## 2024-01-29 ENCOUNTER — Ambulatory Visit: Payer: Self-pay | Admitting: Nurse Practitioner

## 2024-01-29 LAB — CERVICOVAGINAL ANCILLARY ONLY
Bacterial Vaginitis (gardnerella): NEGATIVE
Candida Glabrata: NEGATIVE
Candida Vaginitis: NEGATIVE
Chlamydia: NEGATIVE
Comment: NEGATIVE
Comment: NEGATIVE
Comment: NEGATIVE
Comment: NEGATIVE
Comment: NEGATIVE
Comment: NORMAL
Neisseria Gonorrhea: NEGATIVE
Trichomonas: NEGATIVE

## 2024-01-29 LAB — CBC WITH DIFFERENTIAL/PLATELET
Basophils Absolute: 0 x10E3/uL (ref 0.0–0.2)
Basos: 1 %
EOS (ABSOLUTE): 0.6 x10E3/uL — ABNORMAL HIGH (ref 0.0–0.4)
Eos: 10 %
Hematocrit: 51 % — ABNORMAL HIGH (ref 34.0–46.6)
Hemoglobin: 16.1 g/dL — ABNORMAL HIGH (ref 11.1–15.9)
Immature Grans (Abs): 0 x10E3/uL (ref 0.0–0.1)
Immature Granulocytes: 0 %
Lymphocytes Absolute: 3 x10E3/uL (ref 0.7–3.1)
Lymphs: 48 %
MCH: 28 pg (ref 26.6–33.0)
MCHC: 31.6 g/dL (ref 31.5–35.7)
MCV: 89 fL (ref 79–97)
Monocytes Absolute: 0.6 x10E3/uL (ref 0.1–0.9)
Monocytes: 9 %
Neutrophils Absolute: 2 x10E3/uL (ref 1.4–7.0)
Neutrophils: 32 %
Platelets: 352 x10E3/uL (ref 150–450)
RBC: 5.75 x10E6/uL — ABNORMAL HIGH (ref 3.77–5.28)
RDW: 13.3 % (ref 11.7–15.4)
WBC: 6.2 x10E3/uL (ref 3.4–10.8)

## 2024-01-29 LAB — COMPREHENSIVE METABOLIC PANEL WITH GFR
ALT: 19 IU/L (ref 0–32)
AST: 16 IU/L (ref 0–40)
Albumin: 4.3 g/dL (ref 3.9–4.9)
Alkaline Phosphatase: 112 IU/L (ref 41–116)
BUN/Creatinine Ratio: 13 (ref 9–23)
BUN: 10 mg/dL (ref 6–20)
Bilirubin Total: 0.3 mg/dL (ref 0.0–1.2)
CO2: 20 mmol/L (ref 20–29)
Calcium: 9.6 mg/dL (ref 8.7–10.2)
Chloride: 102 mmol/L (ref 96–106)
Creatinine, Ser: 0.77 mg/dL (ref 0.57–1.00)
Globulin, Total: 3.1 g/dL (ref 1.5–4.5)
Glucose: 87 mg/dL (ref 70–99)
Potassium: 4.1 mmol/L (ref 3.5–5.2)
Sodium: 137 mmol/L (ref 134–144)
Total Protein: 7.4 g/dL (ref 6.0–8.5)
eGFR: 101 mL/min/1.73 (ref >59)

## 2024-01-29 LAB — MAGNESIUM: Magnesium: 2 mg/dL (ref 1.6–2.3)

## 2024-01-29 LAB — VITAMIN B12: Vitamin B-12: 246 pg/mL (ref 232–1245)

## 2024-01-29 LAB — URINE CULTURE: Culture: NO GROWTH

## 2024-01-29 LAB — FOLATE: Folate: 13.1 ng/mL (ref >3.0)

## 2024-01-30 ENCOUNTER — Telehealth: Payer: Self-pay

## 2024-01-30 NOTE — Telephone Encounter (Signed)
 Pt called ([facility]) clinic this morning. Reached patient access and left message for nurse. This RN called pt at this time, no answer. Left voicemail, provided callback number.

## 2024-01-30 NOTE — Telephone Encounter (Addendum)
 Pt reached this RN at this time. Name and DOB verified. Pt voiced questions regarding her pain/symptoms and the prescribed medications. States she is on day two of the Gabapentin  and it is providing minimal to no relief. In addition she is still having intense pelvic pain that woke her up from her sleep last night. This RN reiterated the AVS instructions. States she may need to come back in for reevaluation as her pain has not improved. Told to continue the Gabapentin  as prescribed. Cytology results were negative and urine culture was negative for growth. Erin PA stated the Uribel  was no longer indicated. This addressed the next question of the contraindication of taking Uribel  with Adderall. Pt verbalized understanding. No further questions/concerns.

## 2024-01-31 ENCOUNTER — Telehealth: Admitting: Nurse Practitioner

## 2024-01-31 DIAGNOSIS — M62838 Other muscle spasm: Secondary | ICD-10-CM | POA: Diagnosis not present

## 2024-01-31 DIAGNOSIS — R102 Pelvic and perineal pain: Secondary | ICD-10-CM | POA: Diagnosis not present

## 2024-01-31 MED ORDER — METHOCARBAMOL 500 MG PO TABS: 500.0000 mg | ORAL_TABLET | Freq: Three times a day (TID) | ORAL | 0 refills | Status: AC | PRN

## 2024-01-31 NOTE — Progress Notes (Signed)
 Virtual Visit Consent   Kelli Wells, you are scheduled for a virtual visit with a [facility] provider today. Just as with appointments in the office, your consent must be obtained to participate. Your consent will be active for this visit and any virtual visit you may have with one of our providers in the next 365 days. If you have a MyChart account, a copy of this consent can be sent to you electronically.  As this is a virtual visit, video technology does not allow for your provider to perform a traditional examination. This may limit your provider's ability to fully assess your condition. If your provider identifies any concerns that need to be evaluated in person or the need to arrange testing (such as labs, EKG, etc.), we will make arrangements to do so. Although advances in technology are sophisticated, we cannot ensure that it will always work on either your end or our end. If the connection with a video visit is poor, the visit may have to be switched to a telephone visit. With either a video or telephone visit, we are not always able to ensure that we have a secure connection.  By engaging in this virtual visit, you consent to the provision of healthcare and authorize for your insurance to be billed (if applicable) for the services provided during this visit. Depending on your insurance coverage, you may receive a charge related to this service.  I need to obtain your verbal consent now. Are you willing to proceed with your visit today? Kelli Wells has provided verbal consent on 01/31/2024 for a virtual visit (video or telephone). Kelli LELON Servant, NP  Date: 01/31/2024 6:10 PM   Virtual Visit via Video Note   I, Kelli Wells, connected with  Kelli Wells  ([id number], May 18, 1985) on 01/31/24 at  5:15 PM EDT by a video-enabled telemedicine application and verified that I am speaking with the correct person using two identifiers.  Location: Patient: Virtual  Visit Location Patient: Home Provider: Virtual Visit Location Provider: Home Office   I discussed the limitations of evaluation and management by telemedicine and the availability of in person appointments. The patient expressed understanding and agreed to proceed.    History of Present Illness: Kelli Wells is a 38 y.o. who identifies as a female who was assigned female at birth, and is being seen today for for pelvic pain.  Kelli Wells is experiencing pelvic pain which she contributes to being recently diagnosed with pelvic neuropathy. She is taking gabapentin  which was prescribed by urgent care but states it has not been effective in relieving her pain.   Problems:  Patient Active Problem List   Diagnosis Date Noted   Suspected sleep apnea 02/27/2023   Mild intermittent asthma 01/08/2022   Chronic allergic conjunctivitis 01/08/2022   Allergic rhinitis due to pollen 01/08/2022   Allergic rhinitis due to animal (cat) (dog) hair and dander 01/08/2022   Allergic rhinitis 01/08/2022   Adverse reaction to food 01/08/2022   Carrier of group B Streptococcus 01/08/2022   Vitamin D  deficiency 12/21/2021   Stress 01/10/2021   Psychophysiological insomnia 01/10/2021   Abnormal cervical Papanicolaou smear 01/09/2021   Asthma    Cholecystitis with cholelithiasis 12/17/2014   Non-reactive NST (non-stress test)    Postpartum depression 02/02/2013   Esophageal reflux 02/02/2013   Hypertensive disorder 05/02/2011   Anxiety state 05/02/2011   Attention deficit hyperactivity disorder (ADHD) 05/02/2011    Allergies:  Allergies  Allergen Reactions  Metronidazole  Other (See Comments)    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)     Other Other (See Comments)   Shellfish Allergy Swelling    Facial swelling    Shrimp (Diagnostic) Anxiety, Hives, Itching and Swelling   Medications:  Current Outpatient Medications:    albuterol  (VENTOLIN  HFA) 108 (90  Base) MCG/ACT inhaler, Inhale 1-2 puffs into the lungs every 6 (six) hours as needed for wheezing or shortness of breath., Disp: 18 g, Rfl: 6   amLODipine  (NORVASC ) 10 MG tablet, Take 1 tablet (10 mg total) by mouth daily., Disp: 90 tablet, Rfl: 1   amphetamine-dextroamphetamine (ADDERALL) 30 MG tablet, Take 1 tablet by mouth daily., Disp: , Rfl:    Blood Pressure Monitoring (BLOOD PRESSURE CUFF) MISC, Use to check blood pressure once daily., Disp: 1 each, Rfl: 0   buPROPion  (WELLBUTRIN  XL) 300 MG 24 hr tablet, Take 1 tablet (300 mg total) by mouth every morning., Disp: 30 tablet, Rfl: 0   carvedilol  (COREG ) 25 MG tablet, Take 1 tablet (25 mg total) by mouth 2 (two) times daily with a meal., Disp: 180 tablet, Rfl: 1   clindamycin  (CLEOCIN  T) 1 % external solution, Apply topically 2 (two) times daily. For hidradenitis, Disp: 60 mL, Rfl: 1   clonazePAM  (KLONOPIN ) 0.5 MG tablet, Take 0.5 mg by mouth daily., Disp: , Rfl:    cloNIDine HCl (KAPVAY) 0.1 MG TB12 ER tablet, Take 0.1 mg by mouth daily., Disp: , Rfl:    EPINEPHrine  (EPIPEN  2-PAK) 0.3 mg/0.3 mL IJ SOAJ injection, Inject 0.3 mLs (0.3 mg total) into the muscle as needed for anaphylaxis., Disp: 1 each, Rfl: 1   fluticasone  (FLONASE ) 50 MCG/ACT nasal spray, Place 2 sprays into both nostrils daily., Disp: 16 g, Rfl: 1   gabapentin  (NEURONTIN ) 100 MG capsule, Take 1 capsule (100 mg total) by mouth 3 (three) times daily for 10 days., Disp: 30 capsule, Rfl: 0   hydrochlorothiazide  (HYDRODIURIL ) 12.5 MG tablet, Take 1 tablet (12.5 mg total) by mouth daily., Disp: 90 tablet, Rfl: 1   hydrOXYzine  (ATARAX ) 10 MG tablet, Take 10 mg by mouth 3 (three) times daily., Disp: , Rfl:    hydrOXYzine  (ATARAX ) 25 MG tablet, Take 1 tablet (25 mg total) by mouth 3 (three) times daily as needed., Disp: 90 tablet, Rfl: 0   ipratropium-albuterol  (DUONEB) 0.5-2.5 (3) MG/3ML SOLN, USE 1 AMPULE IN NEBULIZER VIA NEBULIZATION EVERY 6 HOURS AS NEEDED, Disp: 360 mL, Rfl: 0    ipratropium-albuterol  (DUONEB) 0.5-2.5 (3) MG/3ML SOLN, Take 3 mLs by nebulization every 6 (six) hours as needed., Disp: 120 mL, Rfl: 0   loratadine  (CLARITIN ) 10 MG tablet, Take 1 tablet (10 mg total) by mouth daily., Disp: 30 tablet, Rfl: 1   Meth-Hyo-M Bl-Na Phos-Ph Sal (URIBEL ) 118 MG CAPS, Take 1 capsule (118 mg total) by mouth in the morning, at noon, in the evening, and at bedtime., Disp: 20 capsule, Rfl: 0   methocarbamol  (ROBAXIN ) 500 MG tablet, Take 1 tablet (500 mg total) by mouth every 8 (eight) hours as needed for muscle spasms., Disp: 60 tablet, Rfl: 0   naproxen  (NAPROSYN ) 500 MG tablet, Take 1 tablet (500 mg total) by mouth 2 (two) times daily with a meal., Disp: 60 tablet, Rfl: 6   nitrofurantoin , macrocrystal-monohydrate, (MACROBID ) 100 MG capsule, Take 1 capsule (100 mg total) by mouth 2 (two) times daily., Disp: 10 capsule, Rfl: 0   sertraline  (ZOLOFT ) 100 MG tablet, Take 1.5 tablets (150 mg total) by mouth  daily., Disp: 45 tablet, Rfl: 0   Vitamin D , Ergocalciferol , (DRISDOL ) 1.25 MG (50000 UNIT) CAPS capsule, Take 1 capsule (50,000 Units total) by mouth once a week., Disp: 12 capsule, Rfl: 1  Observations/Objective: Patient is well-developed, well-nourished in no acute distress.  Resting comfortably at home.  Head is normocephalic, atraumatic.  No labored breathing.  Speech is clear and coherent with logical content.  Patient is alert and oriented at baseline.    Assessment and Plan: 1. Muscle spasm - methocarbamol  (ROBAXIN ) 500 MG tablet; Take 1 tablet (500 mg total) by mouth every 8 (eight) hours as needed for muscle spasms.  Dispense: 60 tablet; Refill: 0  2. Pelvic pain (Primary) - methocarbamol  (ROBAXIN ) 500 MG tablet; Take 1 tablet (500 mg total) by mouth every 8 (eight) hours as needed for muscle spasms.  Dispense: 60 tablet; Refill: 0   Follow Up Instructions: I discussed the assessment and treatment plan with the patient. The patient was provided an  opportunity to ask questions and all were answered. The patient agreed with the plan and demonstrated an understanding of the instructions.  A copy of instructions were sent to the patient via MyChart unless otherwise noted below.     The patient was advised to call back or seek an in-person evaluation if the symptoms worsen or if the condition fails to improve as anticipated.    Brandonlee Navis W Paislynn Hegstrom, NP

## 2024-01-31 NOTE — Patient Instructions (Signed)
 Kelli Wells, thank you for joining Haze LELON Servant, NP for today's virtual visit.  While this provider is not your primary care provider (PCP), if your PCP is located in our provider database this encounter information will be shared with them immediately following your visit.   A [facility] MyChart account gives you access to today's visit and all your visits, tests, and labs performed at [facility]  click here if you don't have a [facility] MyChart account or go to [email or web address]  Consent: (Patient) Kelli Wells provided verbal consent for this virtual visit at the beginning of the encounter.  Current Medications:  Current Outpatient Medications:    albuterol  (VENTOLIN  HFA) 108 (90 Base) MCG/ACT inhaler, Inhale 1-2 puffs into the lungs every 6 (six) hours as needed for wheezing or shortness of breath., Disp: 18 g, Rfl: 6   amLODipine  (NORVASC ) 10 MG tablet, Take 1 tablet (10 mg total) by mouth daily., Disp: 90 tablet, Rfl: 1   amphetamine-dextroamphetamine (ADDERALL) 30 MG tablet, Take 1 tablet by mouth daily., Disp: , Rfl:    Blood Pressure Monitoring (BLOOD PRESSURE CUFF) MISC, Use to check blood pressure once daily., Disp: 1 each, Rfl: 0   buPROPion  (WELLBUTRIN  XL) 300 MG 24 hr tablet, Take 1 tablet (300 mg total) by mouth every morning., Disp: 30 tablet, Rfl: 0   carvedilol  (COREG ) 25 MG tablet, Take 1 tablet (25 mg total) by mouth 2 (two) times daily with a meal., Disp: 180 tablet, Rfl: 1   clindamycin  (CLEOCIN  T) 1 % external solution, Apply topically 2 (two) times daily. For hidradenitis, Disp: 60 mL, Rfl: 1   clonazePAM  (KLONOPIN ) 0.5 MG tablet, Take 0.5 mg by mouth daily., Disp: , Rfl:    cloNIDine HCl (KAPVAY) 0.1 MG TB12 ER tablet, Take 0.1 mg by mouth daily., Disp: , Rfl:    EPINEPHrine  (EPIPEN  2-PAK) 0.3 mg/0.3 mL IJ SOAJ injection, Inject 0.3 mLs (0.3 mg total) into the muscle as needed for anaphylaxis., Disp: 1 each, Rfl: 1    fluticasone  (FLONASE ) 50 MCG/ACT nasal spray, Place 2 sprays into both nostrils daily., Disp: 16 g, Rfl: 1   gabapentin  (NEURONTIN ) 100 MG capsule, Take 1 capsule (100 mg total) by mouth 3 (three) times daily for 10 days., Disp: 30 capsule, Rfl: 0   hydrochlorothiazide  (HYDRODIURIL ) 12.5 MG tablet, Take 1 tablet (12.5 mg total) by mouth daily., Disp: 90 tablet, Rfl: 1   hydrOXYzine  (ATARAX ) 10 MG tablet, Take 10 mg by mouth 3 (three) times daily., Disp: , Rfl:    hydrOXYzine  (ATARAX ) 25 MG tablet, Take 1 tablet (25 mg total) by mouth 3 (three) times daily as needed., Disp: 90 tablet, Rfl: 0   ipratropium-albuterol  (DUONEB) 0.5-2.5 (3) MG/3ML SOLN, USE 1 AMPULE IN NEBULIZER VIA NEBULIZATION EVERY 6 HOURS AS NEEDED, Disp: 360 mL, Rfl: 0   ipratropium-albuterol  (DUONEB) 0.5-2.5 (3) MG/3ML SOLN, Take 3 mLs by nebulization every 6 (six) hours as needed., Disp: 120 mL, Rfl: 0   loratadine  (CLARITIN ) 10 MG tablet, Take 1 tablet (10 mg total) by mouth daily., Disp: 30 tablet, Rfl: 1   Meth-Hyo-M Bl-Na Phos-Ph Sal (URIBEL ) 118 MG CAPS, Take 1 capsule (118 mg total) by mouth in the morning, at noon, in the evening, and at bedtime., Disp: 20 capsule, Rfl: 0   methocarbamol  (ROBAXIN ) 500 MG tablet, Take 1 tablet (500 mg total) by mouth every 8 (eight) hours as needed for muscle spasms., Disp: 60 tablet, Rfl: 0   naproxen  (NAPROSYN ) 500 MG  tablet, Take 1 tablet (500 mg total) by mouth 2 (two) times daily with a meal., Disp: 60 tablet, Rfl: 6   nitrofurantoin , macrocrystal-monohydrate, (MACROBID ) 100 MG capsule, Take 1 capsule (100 mg total) by mouth 2 (two) times daily., Disp: 10 capsule, Rfl: 0   sertraline  (ZOLOFT ) 100 MG tablet, Take 1.5 tablets (150 mg total) by mouth daily., Disp: 45 tablet, Rfl: 0   Vitamin D , Ergocalciferol , (DRISDOL ) 1.25 MG (50000 UNIT) CAPS capsule, Take 1 capsule (50,000 Units total) by mouth once a week., Disp: 12 capsule, Rfl: 1   Medications ordered in this encounter:  Meds ordered  this encounter  Medications   methocarbamol  (ROBAXIN ) 500 MG tablet    Sig: Take 1 tablet (500 mg total) by mouth every 8 (eight) hours as needed for muscle spasms.    Dispense:  60 tablet    Refill:  0    Supervising Provider:   BLAISE ALEENE KIDD [[id number]]     *If you need refills on other medications prior to your next appointment, please contact your pharmacy*  Follow-Up: Call back or seek an in-person evaluation if the symptoms worsen or if the condition fails to improve as anticipated.  [facility] [phone]  Other Instructions    If you have been instructed to have an in-person evaluation today at a local Urgent Care facility, please use the link below. It will take you to a list of all of our available [facility], including address, phone number and hours of operation. Please do not delay care.  [facility]  If you or a family member do not have a primary care provider, use the link below to schedule a visit and establish care. When you choose a [facility] primary care physician or advanced practice provider, you gain a long-term partner in health. Find a Primary Care Provider  Learn more about [facility]'s in-office and virtual care options: [facility] Get Care Now

## 2024-02-03 ENCOUNTER — Ambulatory Visit: Admitting: Dermatology

## 2024-02-09 DIAGNOSIS — F411 Generalized anxiety disorder: Secondary | ICD-10-CM | POA: Diagnosis not present

## 2024-03-18 ENCOUNTER — Encounter: Payer: Self-pay | Admitting: Family Medicine

## 2024-04-30 ENCOUNTER — Other Ambulatory Visit (HOSPITAL_COMMUNITY): Payer: Self-pay

## 2024-04-30 ENCOUNTER — Telehealth: Admitting: Family Medicine

## 2024-04-30 ENCOUNTER — Telehealth

## 2024-04-30 DIAGNOSIS — N3000 Acute cystitis without hematuria: Secondary | ICD-10-CM | POA: Diagnosis not present

## 2024-04-30 DIAGNOSIS — R3989 Other symptoms and signs involving the genitourinary system: Secondary | ICD-10-CM

## 2024-04-30 MED ORDER — NITROFURANTOIN MONOHYD MACRO 100 MG PO CAPS
100.0000 mg | ORAL_CAPSULE | Freq: Two times a day (BID) | ORAL | 0 refills | Status: AC
  Filled 2024-04-30: qty 14, 7d supply, fill #0

## 2024-04-30 MED ORDER — CEPHALEXIN 500 MG PO CAPS
500.0000 mg | ORAL_CAPSULE | Freq: Two times a day (BID) | ORAL | 0 refills | Status: DC
  Filled 2024-04-30: qty 14, 7d supply, fill #0

## 2024-04-30 NOTE — Addendum Note (Signed)
 Addended by: ALMEDA DEGREE on: 04/30/2024 12:22 PM   Modules accepted: Orders

## 2024-04-30 NOTE — Progress Notes (Signed)

## 2024-05-18 ENCOUNTER — Telehealth: Admitting: Physician Assistant

## 2024-05-18 DIAGNOSIS — N390 Urinary tract infection, site not specified: Secondary | ICD-10-CM

## 2024-05-18 NOTE — Progress Notes (Signed)
" °  Because of recent treatment for UTI in the past couple of weeks and recurring symptoms, I feel your condition warrants further evaluation and I recommend that you be seen in a face-to-face visit.   NOTE: There will be NO CHARGE for this E-Visit   If you are having a true medical emergency, please call 911.     For an urgent face to face visit, [facility] has multiple urgent care centers for your convenience.  Click the link below for the full list of locations and hours, walk-in wait times, appointment scheduling options and driving directions:  Urgent Care - [place], [place], [place], [place], [place], [place]  [place]     Your MyChart E-visit questionnaire answers were reviewed by a board certified advanced clinical practitioner to complete your personal care plan based on your specific symptoms.    Thank you for using e-Visits.    "

## 2024-05-21 ENCOUNTER — Telehealth: Payer: Self-pay | Admitting: Family Medicine

## 2024-05-21 NOTE — Telephone Encounter (Signed)
 Error

## 2024-06-01 ENCOUNTER — Telehealth: Payer: Self-pay | Admitting: Family Medicine

## 2024-06-01 NOTE — Telephone Encounter (Signed)
 Contacted pt to resch appt PCP not in the office feb 9th! Pt wants to transfer to different pcp pt wanted later appt time sch with [facility] at 4:10am same day!

## 2024-06-04 ENCOUNTER — Other Ambulatory Visit: Payer: Self-pay | Admitting: Family Medicine

## 2024-06-04 DIAGNOSIS — I10 Essential (primary) hypertension: Secondary | ICD-10-CM

## 2024-06-15 ENCOUNTER — Encounter: Payer: Self-pay | Admitting: Physician Assistant

## 2024-06-21 ENCOUNTER — Ambulatory Visit: Admitting: Family Medicine

## 2024-06-21 ENCOUNTER — Ambulatory Visit: Admitting: Nurse Practitioner

## 2024-07-12 ENCOUNTER — Ambulatory Visit: Admitting: Physician Assistant
